# Patient Record
Sex: Male | Born: 1940
Health system: Southern US, Community
[De-identification: ages and names within clinical notes are randomized; demographics above are authoritative.]

## PROBLEM LIST (undated history)

## (undated) ENCOUNTER — Emergency Department (HOSPITAL_COMMUNITY): Payer: Medicare Other | Source: Home / Self Care

## (undated) DIAGNOSIS — I2510 Atherosclerotic heart disease of native coronary artery without angina pectoris: Secondary | ICD-10-CM

## (undated) DIAGNOSIS — I5022 Chronic systolic (congestive) heart failure: Secondary | ICD-10-CM

## (undated) DIAGNOSIS — J449 Chronic obstructive pulmonary disease, unspecified: Secondary | ICD-10-CM

## (undated) DIAGNOSIS — Z952 Presence of prosthetic heart valve: Secondary | ICD-10-CM

## (undated) DIAGNOSIS — I350 Nonrheumatic aortic (valve) stenosis: Secondary | ICD-10-CM

## (undated) DIAGNOSIS — I10 Essential (primary) hypertension: Secondary | ICD-10-CM

---

## 2014-03-25 DIAGNOSIS — M79609 Pain in unspecified limb: Secondary | ICD-10-CM | POA: Diagnosis not present

## 2014-03-25 DIAGNOSIS — I8390 Asymptomatic varicose veins of unspecified lower extremity: Secondary | ICD-10-CM | POA: Diagnosis not present

## 2014-03-25 DIAGNOSIS — I83893 Varicose veins of bilateral lower extremities with other complications: Secondary | ICD-10-CM | POA: Diagnosis not present

## 2014-03-25 DIAGNOSIS — X58XXXA Exposure to other specified factors, initial encounter: Secondary | ICD-10-CM | POA: Diagnosis not present

## 2014-03-25 DIAGNOSIS — R58 Hemorrhage, not elsewhere classified: Secondary | ICD-10-CM | POA: Diagnosis not present

## 2015-10-26 DIAGNOSIS — R0902 Hypoxemia: Secondary | ICD-10-CM | POA: Diagnosis not present

## 2015-10-26 DIAGNOSIS — I350 Nonrheumatic aortic (valve) stenosis: Secondary | ICD-10-CM | POA: Diagnosis present

## 2015-10-26 DIAGNOSIS — D649 Anemia, unspecified: Secondary | ICD-10-CM | POA: Diagnosis present

## 2015-10-26 DIAGNOSIS — R069 Unspecified abnormalities of breathing: Secondary | ICD-10-CM | POA: Diagnosis not present

## 2015-10-26 DIAGNOSIS — I5031 Acute diastolic (congestive) heart failure: Secondary | ICD-10-CM | POA: Diagnosis present

## 2015-10-26 DIAGNOSIS — R0602 Shortness of breath: Secondary | ICD-10-CM | POA: Diagnosis not present

## 2015-10-26 DIAGNOSIS — J441 Chronic obstructive pulmonary disease with (acute) exacerbation: Secondary | ICD-10-CM | POA: Diagnosis not present

## 2015-10-26 DIAGNOSIS — Z23 Encounter for immunization: Secondary | ICD-10-CM | POA: Diagnosis not present

## 2015-10-26 DIAGNOSIS — Z01810 Encounter for preprocedural cardiovascular examination: Secondary | ICD-10-CM | POA: Diagnosis not present

## 2015-10-26 DIAGNOSIS — I509 Heart failure, unspecified: Secondary | ICD-10-CM | POA: Diagnosis not present

## 2015-10-26 DIAGNOSIS — Z87891 Personal history of nicotine dependence: Secondary | ICD-10-CM | POA: Diagnosis not present

## 2015-10-26 DIAGNOSIS — R0600 Dyspnea, unspecified: Secondary | ICD-10-CM | POA: Diagnosis not present

## 2015-10-26 DIAGNOSIS — F419 Anxiety disorder, unspecified: Secondary | ICD-10-CM | POA: Diagnosis not present

## 2015-10-26 DIAGNOSIS — R509 Fever, unspecified: Secondary | ICD-10-CM | POA: Diagnosis not present

## 2015-11-09 DIAGNOSIS — I10 Essential (primary) hypertension: Secondary | ICD-10-CM | POA: Diagnosis not present

## 2015-12-09 DIAGNOSIS — L309 Dermatitis, unspecified: Secondary | ICD-10-CM | POA: Diagnosis not present

## 2015-12-09 DIAGNOSIS — I10 Essential (primary) hypertension: Secondary | ICD-10-CM | POA: Diagnosis not present

## 2016-03-07 DIAGNOSIS — E7800 Pure hypercholesterolemia, unspecified: Secondary | ICD-10-CM | POA: Diagnosis not present

## 2016-03-07 DIAGNOSIS — E785 Hyperlipidemia, unspecified: Secondary | ICD-10-CM | POA: Diagnosis not present

## 2016-03-07 DIAGNOSIS — L309 Dermatitis, unspecified: Secondary | ICD-10-CM | POA: Diagnosis not present

## 2016-03-07 DIAGNOSIS — I10 Essential (primary) hypertension: Secondary | ICD-10-CM | POA: Diagnosis not present

## 2016-06-11 DIAGNOSIS — E7800 Pure hypercholesterolemia, unspecified: Secondary | ICD-10-CM | POA: Diagnosis not present

## 2016-06-11 DIAGNOSIS — Z6826 Body mass index (BMI) 26.0-26.9, adult: Secondary | ICD-10-CM | POA: Diagnosis not present

## 2016-06-11 DIAGNOSIS — L309 Dermatitis, unspecified: Secondary | ICD-10-CM | POA: Diagnosis not present

## 2016-06-11 DIAGNOSIS — I10 Essential (primary) hypertension: Secondary | ICD-10-CM | POA: Diagnosis not present

## 2016-09-12 DIAGNOSIS — E785 Hyperlipidemia, unspecified: Secondary | ICD-10-CM | POA: Diagnosis not present

## 2016-09-12 DIAGNOSIS — I10 Essential (primary) hypertension: Secondary | ICD-10-CM | POA: Diagnosis not present

## 2016-09-12 DIAGNOSIS — R262 Difficulty in walking, not elsewhere classified: Secondary | ICD-10-CM | POA: Diagnosis not present

## 2016-09-12 DIAGNOSIS — Z1389 Encounter for screening for other disorder: Secondary | ICD-10-CM | POA: Diagnosis not present

## 2016-09-12 DIAGNOSIS — H6120 Impacted cerumen, unspecified ear: Secondary | ICD-10-CM | POA: Diagnosis not present

## 2016-10-12 DIAGNOSIS — I10 Essential (primary) hypertension: Secondary | ICD-10-CM | POA: Diagnosis not present

## 2016-10-12 DIAGNOSIS — L237 Allergic contact dermatitis due to plants, except food: Secondary | ICD-10-CM | POA: Diagnosis not present

## 2016-12-14 DIAGNOSIS — L309 Dermatitis, unspecified: Secondary | ICD-10-CM | POA: Diagnosis not present

## 2016-12-14 DIAGNOSIS — M25569 Pain in unspecified knee: Secondary | ICD-10-CM | POA: Diagnosis not present

## 2016-12-14 DIAGNOSIS — I10 Essential (primary) hypertension: Secondary | ICD-10-CM | POA: Diagnosis not present

## 2016-12-14 DIAGNOSIS — E785 Hyperlipidemia, unspecified: Secondary | ICD-10-CM | POA: Diagnosis not present

## 2017-02-08 DIAGNOSIS — I700 Atherosclerosis of aorta: Secondary | ICD-10-CM | POA: Diagnosis not present

## 2017-02-08 DIAGNOSIS — J90 Pleural effusion, not elsewhere classified: Secondary | ICD-10-CM | POA: Diagnosis not present

## 2017-02-08 DIAGNOSIS — R600 Localized edema: Secondary | ICD-10-CM | POA: Diagnosis not present

## 2017-02-08 DIAGNOSIS — R062 Wheezing: Secondary | ICD-10-CM | POA: Diagnosis not present

## 2017-02-08 DIAGNOSIS — R0602 Shortness of breath: Secondary | ICD-10-CM | POA: Diagnosis not present

## 2017-02-12 DIAGNOSIS — J90 Pleural effusion, not elsewhere classified: Secondary | ICD-10-CM | POA: Diagnosis not present

## 2017-02-12 DIAGNOSIS — I700 Atherosclerosis of aorta: Secondary | ICD-10-CM | POA: Diagnosis not present

## 2017-02-12 DIAGNOSIS — I2510 Atherosclerotic heart disease of native coronary artery without angina pectoris: Secondary | ICD-10-CM | POA: Diagnosis not present

## 2017-02-12 DIAGNOSIS — I517 Cardiomegaly: Secondary | ICD-10-CM | POA: Diagnosis not present

## 2017-02-14 DIAGNOSIS — R05 Cough: Secondary | ICD-10-CM | POA: Diagnosis not present

## 2017-02-14 DIAGNOSIS — I509 Heart failure, unspecified: Secondary | ICD-10-CM | POA: Diagnosis not present

## 2017-02-14 DIAGNOSIS — R600 Localized edema: Secondary | ICD-10-CM | POA: Diagnosis not present

## 2017-02-14 DIAGNOSIS — I110 Hypertensive heart disease with heart failure: Secondary | ICD-10-CM | POA: Diagnosis not present

## 2017-03-13 DIAGNOSIS — I10 Essential (primary) hypertension: Secondary | ICD-10-CM | POA: Diagnosis not present

## 2017-03-13 DIAGNOSIS — M25569 Pain in unspecified knee: Secondary | ICD-10-CM | POA: Diagnosis not present

## 2017-03-13 DIAGNOSIS — E785 Hyperlipidemia, unspecified: Secondary | ICD-10-CM | POA: Diagnosis not present

## 2017-03-13 DIAGNOSIS — J90 Pleural effusion, not elsewhere classified: Secondary | ICD-10-CM | POA: Diagnosis not present

## 2017-03-27 DIAGNOSIS — J90 Pleural effusion, not elsewhere classified: Secondary | ICD-10-CM | POA: Diagnosis not present

## 2017-03-27 DIAGNOSIS — I10 Essential (primary) hypertension: Secondary | ICD-10-CM | POA: Diagnosis not present

## 2017-04-03 DIAGNOSIS — E785 Hyperlipidemia, unspecified: Secondary | ICD-10-CM | POA: Diagnosis not present

## 2017-04-03 DIAGNOSIS — R600 Localized edema: Secondary | ICD-10-CM | POA: Diagnosis not present

## 2017-04-03 DIAGNOSIS — E875 Hyperkalemia: Secondary | ICD-10-CM | POA: Diagnosis not present

## 2017-04-15 DIAGNOSIS — E875 Hyperkalemia: Secondary | ICD-10-CM | POA: Diagnosis not present

## 2017-04-24 DIAGNOSIS — L309 Dermatitis, unspecified: Secondary | ICD-10-CM | POA: Diagnosis not present

## 2017-04-24 DIAGNOSIS — I509 Heart failure, unspecified: Secondary | ICD-10-CM | POA: Diagnosis not present

## 2017-04-24 DIAGNOSIS — I10 Essential (primary) hypertension: Secondary | ICD-10-CM | POA: Diagnosis not present

## 2017-04-30 DIAGNOSIS — L03116 Cellulitis of left lower limb: Secondary | ICD-10-CM | POA: Diagnosis not present

## 2017-04-30 DIAGNOSIS — R600 Localized edema: Secondary | ICD-10-CM | POA: Diagnosis not present

## 2017-04-30 DIAGNOSIS — I10 Essential (primary) hypertension: Secondary | ICD-10-CM | POA: Diagnosis not present

## 2017-05-02 DIAGNOSIS — I10 Essential (primary) hypertension: Secondary | ICD-10-CM | POA: Diagnosis not present

## 2017-06-03 DIAGNOSIS — I509 Heart failure, unspecified: Secondary | ICD-10-CM | POA: Diagnosis not present

## 2017-06-03 DIAGNOSIS — R600 Localized edema: Secondary | ICD-10-CM | POA: Diagnosis not present

## 2017-06-03 DIAGNOSIS — I10 Essential (primary) hypertension: Secondary | ICD-10-CM | POA: Diagnosis not present

## 2017-06-04 DIAGNOSIS — I10 Essential (primary) hypertension: Secondary | ICD-10-CM | POA: Diagnosis not present

## 2017-06-04 DIAGNOSIS — I509 Heart failure, unspecified: Secondary | ICD-10-CM | POA: Diagnosis not present

## 2017-06-04 DIAGNOSIS — R600 Localized edema: Secondary | ICD-10-CM | POA: Diagnosis not present

## 2017-07-06 DIAGNOSIS — J918 Pleural effusion in other conditions classified elsewhere: Secondary | ICD-10-CM | POA: Diagnosis not present

## 2017-07-06 DIAGNOSIS — I5031 Acute diastolic (congestive) heart failure: Secondary | ICD-10-CM | POA: Diagnosis not present

## 2017-07-06 DIAGNOSIS — R0902 Hypoxemia: Secondary | ICD-10-CM | POA: Diagnosis not present

## 2017-07-06 DIAGNOSIS — R600 Localized edema: Secondary | ICD-10-CM | POA: Diagnosis not present

## 2017-07-06 DIAGNOSIS — R0602 Shortness of breath: Secondary | ICD-10-CM | POA: Diagnosis not present

## 2017-07-06 DIAGNOSIS — Z79899 Other long term (current) drug therapy: Secondary | ICD-10-CM | POA: Diagnosis not present

## 2017-07-06 DIAGNOSIS — I350 Nonrheumatic aortic (valve) stenosis: Secondary | ICD-10-CM | POA: Diagnosis not present

## 2017-07-06 DIAGNOSIS — I110 Hypertensive heart disease with heart failure: Secondary | ICD-10-CM | POA: Diagnosis not present

## 2017-07-06 DIAGNOSIS — N189 Chronic kidney disease, unspecified: Secondary | ICD-10-CM | POA: Diagnosis not present

## 2017-07-06 DIAGNOSIS — J9601 Acute respiratory failure with hypoxia: Secondary | ICD-10-CM | POA: Diagnosis present

## 2017-07-06 DIAGNOSIS — Z87891 Personal history of nicotine dependence: Secondary | ICD-10-CM | POA: Diagnosis not present

## 2017-07-06 DIAGNOSIS — J449 Chronic obstructive pulmonary disease, unspecified: Secondary | ICD-10-CM | POA: Diagnosis not present

## 2017-07-06 DIAGNOSIS — I509 Heart failure, unspecified: Secondary | ICD-10-CM | POA: Diagnosis not present

## 2017-07-06 DIAGNOSIS — Z7982 Long term (current) use of aspirin: Secondary | ICD-10-CM | POA: Diagnosis not present

## 2017-07-06 DIAGNOSIS — I130 Hypertensive heart and chronic kidney disease with heart failure and stage 1 through stage 4 chronic kidney disease, or unspecified chronic kidney disease: Secondary | ICD-10-CM | POA: Diagnosis present

## 2017-07-06 DIAGNOSIS — M7989 Other specified soft tissue disorders: Secondary | ICD-10-CM | POA: Diagnosis not present

## 2017-07-06 DIAGNOSIS — J90 Pleural effusion, not elsewhere classified: Secondary | ICD-10-CM | POA: Diagnosis not present

## 2017-07-07 DIAGNOSIS — R600 Localized edema: Secondary | ICD-10-CM

## 2017-07-07 DIAGNOSIS — I350 Nonrheumatic aortic (valve) stenosis: Secondary | ICD-10-CM

## 2017-07-07 DIAGNOSIS — R0602 Shortness of breath: Secondary | ICD-10-CM

## 2017-07-07 DIAGNOSIS — I509 Heart failure, unspecified: Secondary | ICD-10-CM

## 2017-07-09 ENCOUNTER — Inpatient Hospital Stay (HOSPITAL_COMMUNITY)
Admission: AD | Admit: 2017-07-09 | Discharge: 2017-07-26 | DRG: 266 | Disposition: A | Discharge status: Skilled Nursing Facility | Payer: Medicare Other | Source: Other Acute Inpatient Hospital | Attending: Thoracic Surgery (Cardiothoracic Vascular Surgery) | Admitting: Thoracic Surgery (Cardiothoracic Vascular Surgery)

## 2017-07-09 ENCOUNTER — Encounter (HOSPITAL_COMMUNITY): Payer: Self-pay | Admitting: Family Medicine

## 2017-07-09 ENCOUNTER — Inpatient Hospital Stay (HOSPITAL_COMMUNITY): Payer: Medicare Other

## 2017-07-09 DIAGNOSIS — K083 Retained dental root: Secondary | ICD-10-CM | POA: Diagnosis present

## 2017-07-09 DIAGNOSIS — I10 Essential (primary) hypertension: Secondary | ICD-10-CM | POA: Diagnosis present

## 2017-07-09 DIAGNOSIS — J449 Chronic obstructive pulmonary disease, unspecified: Secondary | ICD-10-CM | POA: Diagnosis present

## 2017-07-09 DIAGNOSIS — I110 Hypertensive heart disease with heart failure: Secondary | ICD-10-CM | POA: Diagnosis not present

## 2017-07-09 DIAGNOSIS — I361 Nonrheumatic tricuspid (valve) insufficiency: Secondary | ICD-10-CM | POA: Diagnosis not present

## 2017-07-09 DIAGNOSIS — K045 Chronic apical periodontitis: Secondary | ICD-10-CM | POA: Diagnosis present

## 2017-07-09 DIAGNOSIS — Z87891 Personal history of nicotine dependence: Secondary | ICD-10-CM

## 2017-07-09 DIAGNOSIS — I371 Nonrheumatic pulmonary valve insufficiency: Secondary | ICD-10-CM | POA: Diagnosis not present

## 2017-07-09 DIAGNOSIS — R2681 Unsteadiness on feet: Secondary | ICD-10-CM | POA: Diagnosis not present

## 2017-07-09 DIAGNOSIS — Z006 Encounter for examination for normal comparison and control in clinical research program: Secondary | ICD-10-CM

## 2017-07-09 DIAGNOSIS — J948 Other specified pleural conditions: Secondary | ICD-10-CM | POA: Diagnosis not present

## 2017-07-09 DIAGNOSIS — M6281 Muscle weakness (generalized): Secondary | ICD-10-CM | POA: Diagnosis not present

## 2017-07-09 DIAGNOSIS — I5023 Acute on chronic systolic (congestive) heart failure: Secondary | ICD-10-CM | POA: Diagnosis not present

## 2017-07-09 DIAGNOSIS — I700 Atherosclerosis of aorta: Secondary | ICD-10-CM | POA: Diagnosis present

## 2017-07-09 DIAGNOSIS — Z952 Presence of prosthetic heart valve: Secondary | ICD-10-CM | POA: Diagnosis not present

## 2017-07-09 DIAGNOSIS — I5040 Unspecified combined systolic (congestive) and diastolic (congestive) heart failure: Secondary | ICD-10-CM | POA: Diagnosis not present

## 2017-07-09 DIAGNOSIS — K029 Dental caries, unspecified: Secondary | ICD-10-CM | POA: Diagnosis present

## 2017-07-09 DIAGNOSIS — J90 Pleural effusion, not elsewhere classified: Secondary | ICD-10-CM | POA: Diagnosis not present

## 2017-07-09 DIAGNOSIS — I272 Pulmonary hypertension, unspecified: Secondary | ICD-10-CM | POA: Diagnosis present

## 2017-07-09 DIAGNOSIS — F0390 Unspecified dementia without behavioral disturbance: Secondary | ICD-10-CM | POA: Diagnosis present

## 2017-07-09 DIAGNOSIS — K0889 Other specified disorders of teeth and supporting structures: Secondary | ICD-10-CM | POA: Diagnosis present

## 2017-07-09 DIAGNOSIS — M264 Malocclusion, unspecified: Secondary | ICD-10-CM | POA: Diagnosis present

## 2017-07-09 DIAGNOSIS — Z01818 Encounter for other preprocedural examination: Secondary | ICD-10-CM | POA: Diagnosis not present

## 2017-07-09 DIAGNOSIS — I350 Nonrheumatic aortic (valve) stenosis: Secondary | ICD-10-CM

## 2017-07-09 DIAGNOSIS — R54 Age-related physical debility: Secondary | ICD-10-CM | POA: Diagnosis present

## 2017-07-09 DIAGNOSIS — I472 Ventricular tachycardia: Secondary | ICD-10-CM | POA: Diagnosis present

## 2017-07-09 DIAGNOSIS — K06020 Generalized gingival recession, unspecified: Secondary | ICD-10-CM | POA: Diagnosis present

## 2017-07-09 DIAGNOSIS — R41841 Cognitive communication deficit: Secondary | ICD-10-CM | POA: Diagnosis not present

## 2017-07-09 DIAGNOSIS — Z66 Do not resuscitate: Secondary | ICD-10-CM | POA: Diagnosis present

## 2017-07-09 DIAGNOSIS — I25110 Atherosclerotic heart disease of native coronary artery with unstable angina pectoris: Secondary | ICD-10-CM | POA: Diagnosis not present

## 2017-07-09 DIAGNOSIS — R0602 Shortness of breath: Secondary | ICD-10-CM

## 2017-07-09 DIAGNOSIS — D62 Acute posthemorrhagic anemia: Secondary | ICD-10-CM | POA: Diagnosis not present

## 2017-07-09 DIAGNOSIS — N183 Chronic kidney disease, stage 3 unspecified: Secondary | ICD-10-CM | POA: Diagnosis present

## 2017-07-09 DIAGNOSIS — I509 Heart failure, unspecified: Secondary | ICD-10-CM

## 2017-07-09 DIAGNOSIS — Z79899 Other long term (current) drug therapy: Secondary | ICD-10-CM

## 2017-07-09 DIAGNOSIS — M7989 Other specified soft tissue disorders: Secondary | ICD-10-CM | POA: Diagnosis present

## 2017-07-09 DIAGNOSIS — I083 Combined rheumatic disorders of mitral, aortic and tricuspid valves: Secondary | ICD-10-CM | POA: Diagnosis present

## 2017-07-09 DIAGNOSIS — I440 Atrioventricular block, first degree: Secondary | ICD-10-CM | POA: Diagnosis not present

## 2017-07-09 DIAGNOSIS — I130 Hypertensive heart and chronic kidney disease with heart failure and stage 1 through stage 4 chronic kidney disease, or unspecified chronic kidney disease: Secondary | ICD-10-CM | POA: Diagnosis present

## 2017-07-09 DIAGNOSIS — N179 Acute kidney failure, unspecified: Secondary | ICD-10-CM | POA: Diagnosis present

## 2017-07-09 DIAGNOSIS — I5022 Chronic systolic (congestive) heart failure: Secondary | ICD-10-CM | POA: Diagnosis present

## 2017-07-09 DIAGNOSIS — K0530 Chronic periodontitis, unspecified: Secondary | ICD-10-CM | POA: Diagnosis present

## 2017-07-09 DIAGNOSIS — R278 Other lack of coordination: Secondary | ICD-10-CM | POA: Diagnosis not present

## 2017-07-09 DIAGNOSIS — Z741 Need for assistance with personal care: Secondary | ICD-10-CM | POA: Diagnosis not present

## 2017-07-09 DIAGNOSIS — Z01810 Encounter for preprocedural cardiovascular examination: Secondary | ICD-10-CM | POA: Diagnosis not present

## 2017-07-09 DIAGNOSIS — Z954 Presence of other heart-valve replacement: Secondary | ICD-10-CM | POA: Diagnosis not present

## 2017-07-09 DIAGNOSIS — I2510 Atherosclerotic heart disease of native coronary artery without angina pectoris: Secondary | ICD-10-CM | POA: Diagnosis not present

## 2017-07-09 DIAGNOSIS — Z8249 Family history of ischemic heart disease and other diseases of the circulatory system: Secondary | ICD-10-CM

## 2017-07-09 DIAGNOSIS — K5792 Diverticulitis of intestine, part unspecified, without perforation or abscess without bleeding: Secondary | ICD-10-CM | POA: Diagnosis not present

## 2017-07-09 DIAGNOSIS — J811 Chronic pulmonary edema: Secondary | ICD-10-CM | POA: Diagnosis not present

## 2017-07-09 DIAGNOSIS — R0789 Other chest pain: Secondary | ICD-10-CM | POA: Diagnosis not present

## 2017-07-09 HISTORY — DX: Presence of prosthetic heart valve: Z95.2

## 2017-07-09 HISTORY — DX: Nonrheumatic aortic (valve) stenosis: I35.0

## 2017-07-09 HISTORY — DX: Chronic obstructive pulmonary disease, unspecified: J44.9

## 2017-07-09 HISTORY — DX: Atherosclerotic heart disease of native coronary artery without angina pectoris: I25.10

## 2017-07-09 HISTORY — DX: Chronic systolic (congestive) heart failure: I50.22

## 2017-07-09 HISTORY — DX: Essential (primary) hypertension: I10

## 2017-07-09 LAB — BASIC METABOLIC PANEL
ANION GAP: 6 (ref 5–15)
BUN: 32 mg/dL — ABNORMAL HIGH (ref 6–20)
CALCIUM: 8.7 mg/dL — AB (ref 8.9–10.3)
CHLORIDE: 96 mmol/L — AB (ref 101–111)
CO2: 34 mmol/L — AB (ref 22–32)
CREATININE: 1.31 mg/dL — AB (ref 0.61–1.24)
GFR calc non Af Amer: 51 mL/min — ABNORMAL LOW (ref >60)
GFR, EST AFRICAN AMERICAN: 59 mL/min — AB (ref >60)
Glucose, Bld: 150 mg/dL — ABNORMAL HIGH (ref 65–99)
Potassium: 4.1 mmol/L (ref 3.5–5.1)
SODIUM: 136 mmol/L (ref 135–145)

## 2017-07-09 LAB — ECHOCARDIOGRAM COMPLETE
AOVTI: 86.2 cm
AV Area VTI index: 0.23 cm2/m2
AV Area VTI: 0.35 cm2
AV Area mean vel: 0.39 cm2
AV Mean grad: 36 mmHg
AV Peak grad: 66 mmHg
AV VEL mean LVOT/AV: 0.17
AV area mean vel ind: 0.22 cm2/m2
AV peak Index: 0.2
AV pk vel: 406 cm/s
AVPHT: 171 ms
Ao pk vel: 0.16 m/s
CHL CUP AV VALUE AREA INDEX: 0.23
CHL CUP AV VEL: 0.41
CHL CUP MV DEC (S): 106
CHL CUP TV REG PEAK VELOCITY: 400 cm/s
DOP CAL AO MEAN VELOCITY: 274 cm/s
E decel time: 106 msec
FS: 11 % — AB (ref 28–44)
HEIGHTINCHES: 66 in
IVS/LV PW RATIO, ED: 0.92
LA ID, A-P, ES: 42 mm
LA vol A4C: 77.9 ml
LA vol index: 46.1 mL/m2
LA vol: 82.6 mL
LADIAMINDEX: 2.34 cm/m2
LDCA: 2.27 cm2
LEFT ATRIUM END SYS DIAM: 42 mm
LV dias vol index: 103 mL/m2
LV dias vol: 184 mL — AB (ref 62–150)
LV sys vol index: 67 mL/m2
LVOT SV: 35 mL
LVOT VTI: 15.5 cm
LVOT diameter: 17 mm
LVOT peak grad rest: 2 mmHg
LVOTPV: 63.4 cm/s
LVOTVTI: 0.18 cm
LVSYSVOL: 121 mL — AB
MV Peak grad: 5 mmHg
MV pk E vel: 111 m/s
MVAP: 2.2 cm2
MVPKAVEL: 42.2 m/s
MVSPHT: 31 ms
PW: 12 mm — AB (ref 0.6–1.1)
RV LATERAL S' VELOCITY: 9.73 cm/s
RV sys press: 79 mmHg
Simpson's disk: 34
Stroke v: 63 ml
TAPSE: 13.5 mm
TRMAXVEL: 400 cm/s
Valve area: 0.41 cm2
WEIGHTICAEL: 2414.4 [oz_av]

## 2017-07-09 LAB — CBC
HCT: 40.1 % (ref 39.0–52.0)
Hemoglobin: 12.7 g/dL — ABNORMAL LOW (ref 13.0–17.0)
MCH: 28.7 pg (ref 26.0–34.0)
MCHC: 31.7 g/dL (ref 30.0–36.0)
MCV: 90.7 fL (ref 78.0–100.0)
PLATELETS: 234 10*3/uL (ref 150–400)
RBC: 4.42 MIL/uL (ref 4.22–5.81)
RDW: 14.9 % (ref 11.5–15.5)
WBC: 11.4 10*3/uL — ABNORMAL HIGH (ref 4.0–10.5)

## 2017-07-09 MED ORDER — LISINOPRIL 10 MG PO TABS
10.0000 mg | ORAL_TABLET | Freq: Every day | ORAL | Status: DC
  Administered 2017-07-10: 10 mg via ORAL
  Filled 2017-07-09 (×2): qty 1

## 2017-07-09 MED ORDER — ONDANSETRON HCL 4 MG/2ML IJ SOLN: 4.0000 mg | Freq: Four times a day (QID) | INTRAMUSCULAR | Status: DC | PRN

## 2017-07-09 MED ORDER — ACETAMINOPHEN 325 MG PO TABS: 650.0000 mg | ORAL_TABLET | Freq: Four times a day (QID) | ORAL | Status: DC | PRN

## 2017-07-09 MED ORDER — ASPIRIN EC 81 MG PO TBEC
81.0000 mg | DELAYED_RELEASE_TABLET | Freq: Every day | ORAL | Status: DC
  Administered 2017-07-09 – 2017-07-26 (×16): 81 mg via ORAL
  Filled 2017-07-09 (×16): qty 1

## 2017-07-09 MED ORDER — FUROSEMIDE 10 MG/ML IJ SOLN
40.0000 mg | Freq: Two times a day (BID) | INTRAMUSCULAR | Status: DC
  Administered 2017-07-10 – 2017-07-11 (×3): 40 mg via INTRAVENOUS
  Filled 2017-07-09 (×3): qty 4

## 2017-07-09 MED ORDER — ENOXAPARIN SODIUM 40 MG/0.4ML ~~LOC~~ SOLN
40.0000 mg | SUBCUTANEOUS | Status: DC
  Administered 2017-07-09 – 2017-07-14 (×5): 40 mg via SUBCUTANEOUS
  Filled 2017-07-09 (×4): qty 0.4

## 2017-07-09 MED ORDER — POTASSIUM CHLORIDE CRYS ER 20 MEQ PO TBCR
20.0000 meq | EXTENDED_RELEASE_TABLET | Freq: Two times a day (BID) | ORAL | Status: DC
  Administered 2017-07-09 – 2017-07-16 (×14): 20 meq via ORAL
  Filled 2017-07-09 (×14): qty 1

## 2017-07-09 MED ORDER — ONDANSETRON HCL 4 MG PO TABS: 4.0000 mg | ORAL_TABLET | Freq: Four times a day (QID) | ORAL | Status: DC | PRN

## 2017-07-09 MED ORDER — FUROSEMIDE 10 MG/ML IJ SOLN
40.0000 mg | Freq: Every day | INTRAMUSCULAR | Status: DC
  Administered 2017-07-09: 40 mg via INTRAVENOUS
  Filled 2017-07-09: qty 4

## 2017-07-09 MED ORDER — ACETAMINOPHEN 650 MG RE SUPP: 650.0000 mg | Freq: Four times a day (QID) | RECTAL | Status: DC | PRN

## 2017-07-09 MED ORDER — METOPROLOL TARTRATE 25 MG PO TABS
25.0000 mg | ORAL_TABLET | Freq: Every day | ORAL | Status: DC
  Administered 2017-07-09 – 2017-07-11 (×3): 25 mg via ORAL
  Filled 2017-07-09 (×3): qty 1

## 2017-07-09 NOTE — H&P (Signed)
History and Physical  Patient Name: Edward Strickland     WUJ:[id number]    DOB: 03-26-1941    DOA: 07/09/2017 PCP: Jaclynn Major, NP  Patient coming from: [facility]  Chief Complaint: Dyspnea, leg swelling      HPI: Edward Strickland is a 76 y.o. male with a past medical history significant for CHF, HTN who presents with dyspnea.  Patient appears to have some mild dementia and is quite tangential and hard to keep on topic.  No corroborative history is available.  He seems to say he has had worsening leg swelling and dyspnea and exertional intolerance for some weeks or months.  He calls this "dropsy" like his uncle.  Lately it got so bad he went to [facility] and was admitted Sunday.  There he was initially hypoxic to 86% percent on room air, had a chest x-ray with bilateral effusions, mild troponin elevation at 0.07, and a proBNP was elevated.   He was started on Lasix, his troponin trend was flat, and he improved with diuresis. Today he hadn't echocardiogram showed EF 20-25%, and AV peak gradient of 89 and AV area of 0.41. Cardiology were consulted, who recommended transfer to a tertiary care center for cardiology and cardiothoracic surgery evaluation.          ROS: Review of Systems  Respiratory: Positive for shortness of breath.   Cardiovascular: Positive for leg swelling.  All other systems reviewed and are negative.         Past Medical History:  Diagnosis Date  . CHF (congestive heart failure) ([place])   . COPD (chronic obstructive pulmonary disease) ([place])   . Hypertension     History reviewed. No pertinent surgical history.  Social History: Patient lives alone.  The patient walks with a cane.  Remote former smoker.  From [place].  Worked in [employer].  Currently lives independently.  Has no children or spouse.   Cannot name a next of kin or POA.  Only listed contact in his transfer paperwork is a "Ms [name]" of Coralyn Mark, whose phone # is listed in his  discharge bundle.  No Known Allergies  Family history: family history includes Congestive Heart Failure in his paternal uncle.  Prior to Admission medications   Medication Sig Start Date End Date Taking? Authorizing Provider  aspirin EC 81 MG tablet Take 81 mg by mouth daily.   Yes [provider]  furosemide (LASIX) 40 MG tablet Take 40 mg by mouth daily. 07/04/17  Yes [provider]  lisinopril (PRINIVIL,ZESTRIL) 10 MG tablet Take 10 mg by mouth daily. 06/06/17  Yes [provider]  metoprolol tartrate (LOPRESSOR) 25 MG tablet Take 25 mg by mouth daily. 06/19/17  Yes [provider]       Physical Exam: BP 121/69 (BP Location: Left Arm)   Pulse 82   Temp 97.8 F (36.6 C) (Oral)   Resp 20   Ht 5\' 6"  (1.676 m)   Wt 68.4 kg (150 lb 14.4 oz)   SpO2 95%   BMI 24.36 kg/m  General appearance: Thin elderly adult male, alert and in no acute distress.    Eyes: Anicteric, conjunctiva pink, lids and lashes normal. PERRL.    ENT: No nasal deformity, discharge, epistaxis.  Hearing normal. OP moist without lesions.   Neck: No neck masses.  Trachea midline.  No thyromegaly/tenderness. Lymph: No cervical or supraclavicular lymphadenopathy. Skin: Warm and dry.  No jaundice.  No suspicious rashes or lesions. Cardiac: RRR, nl S1-S2, SEM present.  Capillary refill is brisk.  JVP not particularly elevated.  Trace LE edema.  Radial pulses 2+ and symmetric. Respiratory: Normal respiratory rate and rhythm.  Dimiished at bases.  Some bibasilar crackles.  No wheezing.  Sounds dyspneic when he talkes. Abdomen: Abdomen soft.  No TTP. No ascites, distension, hepatosplenomegaly.   MSK: No deformities or effusions.  No cyanosis or clubbing. Neuro: Cranial nerves 3-12 intact.  Sensation intact to light touch. Speech is fluent.  Muscle strength 5/5 and symmetric.    Psych: Sensorium intact and responding to questions, attention diminished, tangential.   Oriented to ""  and "Gloris [place]" but misses year and month. Behavior appropriate.  Affect normal.  Judgment and insight appear slightly impaired.     Labs on Admission:  I have personally reviewed following labs and imaging studies from his outpatient facility: Basic metabolic panel shows normal sodium, potassium, elevated creatinine, unknown baseline. Complete blood count shows no leukocytosis, anemia, thrombocytopenia. Elevated proBNP. Minimally elevated troponin, flat trajectory. Chest x-ray shows bilateral pleural effusions, report reviewed, no edema.  Echocardiogram shows EF 20-25%, mild LVH, elevated AV gradient and diminished AV area. Severe tricuspid regurgitation. PA P 52. Bilateral Dopplers of the legs show no DVT.   EKG: Independently reviewed. ECG showed no ischemic changes.         Assessment/Plan  1. Acute on chronic systolic CHF with severe aortic stenosis:    -Furosemide 40 mg IV daily -K supplement -Strict I/Os, daily weights, telemetry  -Daily monitoring renal function -Continue ACEi, BB -Consult to Cardiology, appreciate cares   2. Hypertension:  -Continue BB, ACEi, aspirin  3. Chronic kidney disease:  Elevated Cr presumably, baseline unknown.  Probably CKD. -Daily SCr  4. COPD:  Chart history.  Patient not on inhalers.  Remote smoking history.  Unclear if this is true.      DVT prophylaxis: Lovenox  Code Status: DO NOT RESUSCITATE  Family Communication: None present  Disposition Plan: Anticipate Cardiology consultation, likely will need CT surgery consultation. Consults called: Cardiology via Inbasket Admission status: INPATIENT        Medical decision making: Patient seen at 1:07 AM on 07/09/2017.  What exists of the patient's chart was reviewed in depth and summarized above.  Clinical condition: stable.        Edwin Dada [facility] Pager [phone]      At the time of admission, it appears that the appropriate admission  status for this patient is INPATIENT. This is judged to be reasonable and necessary in order to provide the required intensity of service to ensure the patient's safety given the presenting symptoms, physical exam findings, and initial radiographic and laboratory data in the context of their chronic comorbidities.  Together, these circumstances are felt to place him at high risk for further clinical deterioration threatening life, limb, or organ.   Patient requires inpatient status due to high intensity of service, high risk for further deterioration and high frequency of surveillance required because of this severe exacerbation of their chronic organ failure.  I certify that at the point of admission it is my clinical judgment that the patient will require inpatient hospital care spanning beyond 2 midnights from the point of admission and that early discharge would result in unnecessary risk of decompensation and readmission or threat to life, limb or bodily function.

## 2017-07-09 NOTE — Plan of Care (Signed)
Problem: Safety: Goal: Ability to remain free from injury will improve Outcome: Progressing Up with one assist and cane   Problem: Health Behavior/Discharge Planning: Goal: Ability to manage health-related needs will improve Outcome: Progressing Has a friend "sis" who helps him

## 2017-07-09 NOTE — Progress Notes (Signed)
*  PRELIMINARY RESULTS* Echocardiogram 2D Echocardiogram has been performed.  Edward Strickland 07/09/2017, 4:32 PM

## 2017-07-09 NOTE — Progress Notes (Signed)
Patient stable during 7 a to 7 p shift.  Patient with oxygen saturation 81% on room air, maintains in the 90's with 2 liters however patient is very dyspneic even at rest.  Patient with multiple loose BM's during shift, appears to be patients baseline.  He says it is because of the fruit and salad we have fed him here.

## 2017-07-09 NOTE — Progress Notes (Signed)
Patient seen and examined. Admitted after midnight secondary dyspneal leg swelling. Patient originally admitted at [facility] were he was receiving treatment for CHF exacerbation. While hospitalized he had 2-D echo done demonstrating EF 20-25% and severe aortic stenosis. Cardiology recommended transfer to [facility], Given findings of severe aortic stenosis with acute CHF in order to be evaluated by the multidisciplinary valve team and to perform cardiac cath if needed. Cardiology service is on board and will continue current IV lasix and oxygen supplementation. Follow renal function and electrolytes. Please refer to H&P written by Dr. Loleta Books for further info/details on admission.   Barton Dubois MD [phone]

## 2017-07-09 NOTE — Consult Note (Signed)
Inpatient TAVR Consultation:   Patient ID: Edward Strickland; [id number]; [birth year]   Admit date: 07/09/2017 Date of Consult: 07/09/2017  Primary Care Provider: Jaclynn Major, NP Primary Cardiologist: New    Patient Profile:   Edward Strickland is a 76 y.o. male with a hx of HTN, aortic stenosis, and COPD who is being seen today for the evaluation of severe AS and CHF at the request of Dr. Dyann Kief.  History of Present Illness:   Per review of [facility] notes, the patient was originally diagnosed with severe aortic stenosis back in 2016, but LV function was normal. Review of previous echo report from 09/2015 EF 60-65%, showed trileaflet aortic valve with AVA 0.44 cm^2, mean gradient 66 mm Hg and peak gradient 89. He follows with Jaclynn Major NP in [place]. He does not follow with a cardiologist. He has been maintained on PO lasix 40mg  daily, Lopressor 12.5mg  BID and Lisinopril 10mg  daily. He has poor dentition and does not regularly see a dentist.   He lives alone in a house in [place]. He has a friend named [name] who lives close by and checks in on him. He is divorced and has no kids. He has no other living family. He is now retired but worked for [employer] for over 25 years. He previously smoked but quit remotely. He walks with a cane and drives his car to get his meals at a [employer]. He enjoys panning for gold in his house. He is not very active physically. He likes to walk around his house, but has trouble with this because of dyspnea on exertion and lower extremity edema. He reports that when his LE swelling gets bad, this limits his mobility the most. He also reports orthopnea and PND and having to get up and walk around during the night to get comfortable. He denies chest pain or dizziness/syncope.  Edward Strickland was in his usual state of health until 07/07/17 when he presented to [facility] ED complaining of worsening lower extremity edema and dyspnea on  exertion  a few weeks. In the ED he was hypoxic O2 sat 86% on room air, lower extremity dopplers negative for DVT, chest x-ray with evidence of CHF with bilateral pleural effusions R>L, cardiomegaly and atelectasis vs pneumonia in right middle lobe/lung base. ProBNP 28,800. Troponin 0.07--> 0.08, but there is report that his troponin is chronically mildly elevated. ECG with sinus rhythm and nonspecific ST/TW changes. WBC 9.6, H/H 12.6/38.8, PLT 193, sodium 139, K 4.4, BUN 38, creatinine 1.3, GFR 54, INR 1.2. He was started on IV Lasix and admitted to the hospital. 2D echo was performed which showed mild LVH with severe global hypokinesis, EF 20-25%, moderately enlarged RV with moderate systolic RV dysfunction, severe aortic stenosis with a mean gradient of 51 mmHg and a peak gradient of 89 mmHg, AVA 0.41 cm, mild biatrial enlargement, mild MR, moderate to severe TR, moderate pulmonary hypertension with PA 52 mmHg.  Given findings of severe aortic stenosis with acute CHF he was transferred to [facility] for further workup and evaluation by the multidisciplinary valve team.   He is currently feeling much better. No CP or SOB. He still has some mild LE edema, but no orthopnea or PND. No dizziness or syncope. No blood in stool or urine. No palpitations.    Past Medical History:  Diagnosis Date  . CHF (congestive heart failure) ([place])   . COPD (chronic obstructive pulmonary disease) ([place])   . Hypertension  History reviewed. No pertinent surgical history.   Inpatient Medications: Scheduled Meds: . aspirin EC  81 mg Oral Daily  . enoxaparin (LOVENOX) injection  40 mg Subcutaneous Q24H  . furosemide  40 mg Intravenous Daily  . lisinopril  10 mg Oral Daily  . metoprolol tartrate  25 mg Oral Daily  . potassium chloride  20 mEq Oral BID   Continuous Infusions:  PRN Meds: acetaminophen **OR** acetaminophen, ondansetron **OR** ondansetron (ZOFRAN) IV  Allergies:   No Known  Allergies  Social History:   Social History   Social History  . Marital status: Single    Spouse name: N/A  . Number of children: N/A  . Years of education: N/A   Occupational History  . Not on file.   Social History Main Topics  . Smoking status: Former Research scientist (life sciences)  . Smokeless tobacco: Never Used  . Alcohol use Not on file  . Drug use: Unknown  . Sexual activity: Not on file   Other Topics Concern  . Not on file   Social History Narrative  . No narrative on file    Family History:   The patient's family history includes Congestive Heart Failure in his paternal uncle.  ROS:  Please see the history of present illness.  ROS  All other ROS reviewed and negative.     Physical Exam/Data:   Vitals:   07/09/17 0017 07/09/17 0552 07/09/17 0801 07/09/17 0803  BP: 121/69 130/84    Pulse: 82 88    Resp: 20 18    Temp: 97.8 F (36.6 C) 97.6 F (36.4 C)    TempSrc: Oral Oral    SpO2: 95% 94% (!) 82% 94%  Weight: 150 lb 14.4 oz (68.4 kg)     Height: 5\' 6"  (1.676 m)       Intake/Output Summary (Last 24 hours) at 07/09/17 0912 Last data filed at 07/09/17 0857  Gross per 24 hour  Intake              600 ml  Output                0 ml  Net              600 ml   Filed Weights   07/09/17 0017  Weight: 150 lb 14.4 oz (68.4 kg)   Body mass index is 24.36 kg/m.  General:  Well nourished, well developed, in no acute distress, disheveled appearing, poor dentition HEENT: normal Lymph: no adenopathy Neck: no JVD Endocrine:  No thryomegaly Vascular: No carotid bruits; FA pulses 2+ bilaterally without bruits  Cardiac:  normal S1, S2; RRR; 3/6 SEM at RUSB Lungs: bilateral crackles midway up lung fields Abd: soft, nontender, no hepatomegaly  Ext: 1 + bilateral LE edema with some mild erythema  Musculoskeletal:  No deformities, BUE and BLE strength normal and equal Skin: warm and dry  Neuro:  CNs 2-12 intact, no focal abnormalities noted Psych:  Normal affect   EKG:  The  EKG was personally reviewed and demonstrates: sinus with 1st deg AV block, LVH with repol abnormality, HR 74 Telemetry:  Telemetry was personally reviewed and demonstrates:  NSR with rare PVC and 7 beat run of NSVT   Relevant CV Studies: 2D ECHO (see HPI), repeat study pending here  Laboratory Data:  ChemistryNo results for input(s): NA, K, CL, CO2, GLUCOSE, BUN, CREATININE, CALCIUM, GFRNONAA, GFRAA, ANIONGAP in the last 168 hours.  No results for input(s): PROT, ALBUMIN, AST, ALT, ALKPHOS, BILITOT in  the last 168 hours. Hematology Recent Labs Lab 07/09/17 0504  WBC 11.4*  RBC 4.42  HGB 12.7*  HCT 40.1  MCV 90.7  MCH 28.7  MCHC 31.7  RDW 14.9  PLT 234   Cardiac EnzymesNo results for input(s): TROPONINI in the last 168 hours. No results for input(s): TROPIPOC in the last 168 hours.  BNPNo results for input(s): BNP, PROBNP in the last 168 hours.  DDimer No results for input(s): DDIMER in the last 168 hours.  Radiology/Studies:  No results found.   STS Risk Calculator: Procedure: AV Replacement  Risk of Mortality: 2.468%  Morbidity or Mortality: 22.844%  Long Length of Stay: 8.651%  Short Length of Stay: 29.284%  Permanent Stroke: 1.199%  Prolonged Ventilation: 13.137%  DSW Infection: 0.626%  Renal Failure: 4.921%  Reoperation: 9.559%    Assessment and Plan:   Edward Strickland is a 76 y.o. male with symptoms of severe, stage D aortic stenosis with NYHA Class II-III symptoms. I have personally reviewed the patient's recent echocardiogram which is notable for reduced LV systolic function (EF 64-33%) and severe aortic stenosis with peak gradient of 89 mmHg and mean transvalvular gradient of 51 mmHg. The patient's calculated aortic valve area is 0.41 cm.   I have reviewed the natural history of aortic stenosis with the patient. We have discussed the limitations of medical therapy and the poor prognosis associated with symptomatic aortic stenosis. We have reviewed potential  treatment options, including palliative medical therapy, conventional surgical aortic valve replacement, and transcatheter aortic valve replacement. We discussed treatment options in the context of this patient's specific comorbid medical conditions.   The patient is somewhat frail but lives independently. He walks with the assistance of a cane but still drives his car to get back and forth to his favorite restaurant. The patient is a difficult historian but it seems his mobility is mostly limited by his dyspnea and LE edema. He does walk around his house. He has has severe AS by echo since at least 2016. Now he has biventricular dysfunction and mod-severe TR with clinical CHF requiring inpatient admission.   The patient's predicted risk of mortality with conventional aortic valve replacement is 2.468% primarily based on his LV dysfunction and age. Other significant comorbid conditions include his frailty,  presumed mild COPD, HTN, pulmonary HTN, mod-severe TR and possible dementia. TAVR seems like a reasonable treatment option for this patient pending formal cardiac surgical consultation. We discussed typical evaluation which will require a gated cardiac CTA and a CTA of the chest/abdomen/pelvis to evaluate both his cardiac anatomy and peripheral vasculature as well as L/RHC which may be able to be completed during this admission. I have ordered an echo here as they did not send the images from [place].   Signed, Angelena Form, PA-C  07/09/2017 9:12 AM   Patient seen, examined. Available data reviewed. Agree with findings, assessment, and plan as outlined by Nell Range, PA-C. On my exam tonight: Vitals:   07/09/17 0918 07/09/17 1100  BP: (!) 112/58 (!) 93/52  Pulse: 89 70  Resp:  18  Temp:  (!) 97.3 F (36.3 C)  SpO2: 98% 94%   Pt is alert and oriented, WD, WN, in no distress. HEENT: normal except for poor dentition. Neck: JVP elevated. Carotid upstrokes delayed with bilateral bruits.  No thyromegaly. Lungs: diminished in the bases CV: Apex is discrete and nondisplaced, RRR with distant heart sounds, 3/6 harsh late peaking systolic murmur at the RUSB/LLSB Abd: soft, NT, +BS, no bruit,  no hepatosplenomegaly Back: no CVA tenderness Ext: 1= pretibial edema Skin: warm and dry, chronic stasis changes L>R Neuro: CNII-XII intact             Strength intact = bilaterally  The patient has acute on chronic systolic heart failure in the setting of severe aortic stenosis. He previously had normal LV function with a normal LVEF and severely elevated transaortic valve gradients based on an echo report from 2016 when his mean gradient was 66 mmHg. I have personally reviewed his recent echo images which demonstrate severe global LV systolic dysfunction and very severe aortic stenosis. The aortic valve is severely calcified and markedly restricted. There is at least mild aortic insufficiency. The dimensionless index is 0.16. I reviewed the natural history of aortic stenosis with the patient today. He seems to have fairly limited insight but does understand the basic principle that his prognosis is poor with no intervention. He would like to seek treatment. The patient lives independently with a limited functional capacity. He is able to get outside and he ambulates with a cane.  I discussed the need for right and left heart catheterization as a first step for this patient. I have reviewed the risks, indications, and alternatives to cardiac catheterization, possible angioplasty, and stenting with the patient. Risks include but are not limited to bleeding, infection, vascular injury, stroke, myocardial infection, arrhythmia, kidney injury, radiation-related injury in the case of prolonged fluoroscopy use, emergency cardiac surgery, and death. The patient understands the risks of serious complication is 1-2 in 8916 with diagnostic cardiac cath and 1-2% or less with angioplasty/stenting. Will plan to do  this in the next 24-48 hours depending on his clinical stability. Will add a chest x-ray as on exam he appears to have bilateral pleural effusions. There is also a pleural effusion appreciable on his echocardiogram. Will increase IV furosemide to twice daily dosing. Will follow-up in the morning to make sure his respiratory status is stable for a cardiac catheterization procedure. Following heart catheterization I would anticipate CT angiography studies, PFTs, and cardiac surgical evaluation.  Sherren Mocha, M.D. 07/09/2017 6:58 PM

## 2017-07-10 ENCOUNTER — Inpatient Hospital Stay (HOSPITAL_COMMUNITY): Payer: Medicare Other

## 2017-07-10 ENCOUNTER — Encounter (HOSPITAL_COMMUNITY): Payer: Self-pay | Admitting: Dentistry

## 2017-07-10 ENCOUNTER — Other Ambulatory Visit: Payer: Self-pay | Admitting: *Deleted

## 2017-07-10 ENCOUNTER — Other Ambulatory Visit: Payer: Self-pay

## 2017-07-10 ENCOUNTER — Encounter (HOSPITAL_COMMUNITY)
Admission: AD | Disposition: A | Discharge status: Skilled Nursing Facility | Payer: Self-pay | Source: Other Acute Inpatient Hospital | Attending: Cardiovascular Disease

## 2017-07-10 DIAGNOSIS — I472 Ventricular tachycardia: Secondary | ICD-10-CM

## 2017-07-10 DIAGNOSIS — I350 Nonrheumatic aortic (valve) stenosis: Secondary | ICD-10-CM

## 2017-07-10 DIAGNOSIS — Z01818 Encounter for other preprocedural examination: Secondary | ICD-10-CM

## 2017-07-10 DIAGNOSIS — J90 Pleural effusion, not elsewhere classified: Secondary | ICD-10-CM

## 2017-07-10 DIAGNOSIS — J449 Chronic obstructive pulmonary disease, unspecified: Secondary | ICD-10-CM

## 2017-07-10 LAB — BASIC METABOLIC PANEL
Anion gap: 6 (ref 5–15)
BUN: 28 mg/dL — AB (ref 6–20)
CALCIUM: 8.8 mg/dL — AB (ref 8.9–10.3)
CO2: 35 mmol/L — ABNORMAL HIGH (ref 22–32)
CREATININE: 1.11 mg/dL (ref 0.61–1.24)
Chloride: 96 mmol/L — ABNORMAL LOW (ref 101–111)
GFR calc non Af Amer: >60 mL/min (ref >60)
Glucose, Bld: 119 mg/dL — ABNORMAL HIGH (ref 65–99)
Potassium: 4.3 mmol/L (ref 3.5–5.1)
SODIUM: 137 mmol/L (ref 135–145)

## 2017-07-10 LAB — POCT I-STAT 3, VENOUS BLOOD GAS (G3P V)
ACID-BASE EXCESS: 9 mmol/L — AB (ref 0.0–2.0)
Acid-Base Excess: 10 mmol/L — ABNORMAL HIGH (ref 0.0–2.0)
BICARBONATE: 37.7 mmol/L — AB (ref 20.0–28.0)
BICARBONATE: 39 mmol/L — AB (ref 20.0–28.0)
O2 SAT: 45 %
O2 Saturation: 60 %
PCO2 VEN: 76.1 mmHg — AB (ref 44.0–60.0)
PO2 VEN: 28 mmHg — AB (ref 32.0–45.0)
TCO2: 41 mmol/L — AB (ref 22–32)
TCO2: 40 mmol/L — ABNORMAL HIGH (ref 22–32)
pCO2, Ven: 75.3 mmHg (ref 44.0–60.0)
pH, Ven: 7.308 (ref 7.250–7.430)
pH, Ven: 7.317 (ref 7.250–7.430)
pO2, Ven: 36 mmHg (ref 32.0–45.0)

## 2017-07-10 LAB — POCT I-STAT 3, ART BLOOD GAS (G3+)
ACID-BASE EXCESS: 9 mmol/L — AB (ref 0.0–2.0)
BICARBONATE: 36.9 mmol/L — AB (ref 20.0–28.0)
O2 SAT: 91 %
PO2 ART: 65 mmHg — AB (ref 83.0–108.0)
TCO2: 39 mmol/L — AB (ref 22–32)
pCO2 arterial: 63.6 mmHg — ABNORMAL HIGH (ref 32.0–48.0)
pH, Arterial: 7.371 (ref 7.350–7.450)

## 2017-07-10 LAB — PROTIME-INR
INR: 1.12
PROTHROMBIN TIME: 14.3 s (ref 11.4–15.2)

## 2017-07-10 SURGERY — RIGHT HEART CATH AND CORONARY ANGIOGRAPHY
Anesthesia: LOCAL

## 2017-07-10 MED ORDER — HEPARIN SODIUM (PORCINE) 1000 UNIT/ML IJ SOLN
INTRAMUSCULAR | Status: DC | PRN
  Administered 2017-07-10: 4000 [IU] via INTRAVENOUS

## 2017-07-10 MED ORDER — FUROSEMIDE 10 MG/ML IJ SOLN
INTRAMUSCULAR | Status: AC
  Filled 2017-07-10: qty 8

## 2017-07-10 MED ORDER — SODIUM CHLORIDE 0.9% FLUSH: 3.0000 mL | INTRAVENOUS | Status: DC | PRN

## 2017-07-10 MED ORDER — MIDAZOLAM HCL 2 MG/2ML IJ SOLN
INTRAMUSCULAR | Status: DC | PRN
  Administered 2017-07-10: 1 mg via INTRAVENOUS

## 2017-07-10 MED ORDER — SODIUM CHLORIDE 0.9 % IV SOLN: INTRAVENOUS | Status: DC

## 2017-07-10 MED ORDER — HEPARIN (PORCINE) IN NACL 2-0.9 UNIT/ML-% IJ SOLN
INTRAMUSCULAR | Status: AC
  Filled 2017-07-10: qty 500

## 2017-07-10 MED ORDER — MIDAZOLAM HCL 2 MG/2ML IJ SOLN
INTRAMUSCULAR | Status: AC
  Filled 2017-07-10: qty 2

## 2017-07-10 MED ORDER — LIDOCAINE HCL (PF) 1 % IJ SOLN
INTRAMUSCULAR | Status: AC
  Filled 2017-07-10: qty 30

## 2017-07-10 MED ORDER — VERAPAMIL HCL 2.5 MG/ML IV SOLN
INTRAVENOUS | Status: DC | PRN
  Administered 2017-07-10: 10 mL via INTRA_ARTERIAL

## 2017-07-10 MED ORDER — FENTANYL CITRATE (PF) 100 MCG/2ML IJ SOLN
INTRAMUSCULAR | Status: AC
  Filled 2017-07-10: qty 2

## 2017-07-10 MED ORDER — SODIUM CHLORIDE 0.9% FLUSH: 3.0000 mL | Freq: Two times a day (BID) | INTRAVENOUS | Status: DC

## 2017-07-10 MED ORDER — IOPAMIDOL (ISOVUE-370) INJECTION 76%
INTRAVENOUS | Status: DC | PRN
  Administered 2017-07-10: 35 mL

## 2017-07-10 MED ORDER — VERAPAMIL HCL 2.5 MG/ML IV SOLN
INTRAVENOUS | Status: AC
  Filled 2017-07-10: qty 2

## 2017-07-10 MED ORDER — LIDOCAINE HCL (PF) 1 % IJ SOLN
INTRAMUSCULAR | Status: DC | PRN
  Administered 2017-07-10 (×2): 2 mL

## 2017-07-10 MED ORDER — SODIUM CHLORIDE 0.9 % IV SOLN: 250.0000 mL | INTRAVENOUS | Status: DC | PRN

## 2017-07-10 MED ORDER — FUROSEMIDE 10 MG/ML IJ SOLN
INTRAMUSCULAR | Status: DC | PRN
  Administered 2017-07-10: 80 mg via INTRAVENOUS

## 2017-07-10 MED ORDER — HEPARIN (PORCINE) IN NACL 2-0.9 UNIT/ML-% IJ SOLN
INTRAMUSCULAR | Status: DC | PRN
  Administered 2017-07-10: 13:00:00

## 2017-07-10 MED ORDER — SODIUM CHLORIDE 0.9% FLUSH
3.0000 mL | Freq: Two times a day (BID) | INTRAVENOUS | Status: DC
  Administered 2017-07-10 – 2017-07-25 (×22): 3 mL via INTRAVENOUS

## 2017-07-10 MED ORDER — ASPIRIN 81 MG PO CHEW: 81.0000 mg | CHEWABLE_TABLET | ORAL | Status: DC

## 2017-07-10 MED ORDER — FUROSEMIDE 10 MG/ML IJ SOLN
20.0000 mg | Freq: Once | INTRAMUSCULAR | Status: DC
  Filled 2017-07-10: qty 2

## 2017-07-10 MED ORDER — FENTANYL CITRATE (PF) 100 MCG/2ML IJ SOLN
INTRAMUSCULAR | Status: DC | PRN
  Administered 2017-07-10: 25 ug via INTRAVENOUS

## 2017-07-10 MED ORDER — IOPAMIDOL (ISOVUE-370) INJECTION 76%
INTRAVENOUS | Status: AC
  Filled 2017-07-10: qty 100

## 2017-07-10 SURGICAL SUPPLY — 11 items

## 2017-07-10 NOTE — Interval H&P Note (Signed)
History and Physical Interval Note:  07/10/2017 11:48 AM  Edward Strickland  has presented today for surgery, with the diagnosis of cp  The various methods of treatment have been discussed with the patient and family. After consideration of risks, benefits and other options for treatment, the patient has consented to  Procedure(s): RIGHT/LEFT HEART CATH AND CORONARY ANGIOGRAPHY (N/A) as a surgical intervention .  The patient's history has been reviewed, patient examined, no change in status, stable for surgery.  I have reviewed the patient's chart and labs.  Questions were answered to the patient's satisfaction.     Sherren Mocha

## 2017-07-10 NOTE — Progress Notes (Signed)
PROGRESS NOTE  Edward Strickland  WGN:[id number]  DOB: November 29, 1940  DOA: 07/09/2017 PCP: Jaclynn Major, NP  Brief Admission Hx: Edward Strickland is a 76 y.o. male with a past medical history significant for CHF, HTN who presents with dyspnea, transferred from [facility] for cardiology evaluation.   MDM/Assessment & Plan:   Acute on chronic systolic heart failure in the setting of severe aortic stenosis - appreciate cardiology assistance, planning for left and right heart cath. Echocardiogram: Systolic function was severely reduced. The estimated ejection fraction was in the range of 25% to 30%.  Diffuse hypokinesis.  Severe AS - per cardiology service, planning cath as above.   Hypertension - BP well controlled, continue current medical therapy.   COPD - stable  Right pleural effuison - Consult to IR for thoracentesis requested.   I spoke with Dr. Burt Knack, he will take over care of patient.    DVT prophylaxis: lovenox Code Status: DNR Family Communication: no family present at bedside today Disposition Plan: TBD   Consultants:  cardiology  Procedures:  Cath pending  Subjective: Pt sitting up in chair, no complaints, no chest pain, no SOB.   Objective: Vitals:   07/09/17 0918 07/09/17 1100 07/09/17 2003 07/10/17 0531  BP: (!) 112/58 (!) 93/52 (!) 105/49 (!) 110/54  Pulse: 89 70 74 81  Resp:  18 17 18   Temp:  (!) 97.3 F (36.3 C) 98 F (36.7 C) 97.9 F (36.6 C)  TempSrc:  Oral Oral Oral  SpO2: 98% 94% 98% 96%  Weight:    69.2 kg (152 lb 8 oz)  Height:        Intake/Output Summary (Last 24 hours) at 07/10/17 0748 Last data filed at 07/10/17 0530  Gross per 24 hour  Intake              600 ml  Output             1725 ml  Net            -1125 ml   Filed Weights   07/09/17 0017 07/10/17 0531  Weight: 68.4 kg (150 lb 14.4 oz) 69.2 kg (152 lb 8 oz)     REVIEW OF SYSTEMS  As per history otherwise all reviewed and reported negative  Exam:  General  exam: awake, alert, NAD.   Respiratory system: No increased work of breathing. Cardiovascular system: S1 & S2 heard, loud systolic murmur.  Gastrointestinal system: Abdomen is nondistended, soft and nontender. Normal bowel sounds heard. Central nervous system: Alert and oriented. No focal neurological deficits. Extremities: no cyanosis.   Data Reviewed: Basic Metabolic Panel:  Recent Labs Lab 07/09/17 1935 07/10/17 0445  NA 136 137  K 4.1 4.3  CL 96* 96*  CO2 34* 35*  GLUCOSE 150* 119*  BUN 32* 28*  CREATININE 1.31* 1.11  CALCIUM 8.7* 8.8*   Liver Function Tests: No results for input(s): AST, ALT, ALKPHOS, BILITOT, PROT, ALBUMIN in the last 168 hours. No results for input(s): LIPASE, AMYLASE in the last 168 hours. No results for input(s): AMMONIA in the last 168 hours. CBC:  Recent Labs Lab 07/09/17 0504  WBC 11.4*  HGB 12.7*  HCT 40.1  MCV 90.7  PLT 234   Cardiac Enzymes: No results for input(s): CKTOTAL, CKMB, CKMBINDEX, TROPONINI in the last 168 hours. CBG (last 3)  No results for input(s): GLUCAP in the last 72 hours. No results found for this or any previous visit (from the past 240 hour(s)).   Studies: Dg  Chest Port 1v Same Day  Result Date: 07/09/2017 CLINICAL DATA:  Shortness of breath.  Hypoxia. EXAM: PORTABLE CHEST 1 VIEW COMPARISON:  None. FINDINGS: There is a large right pleural effusion with associated airspace disease. Cardiomegaly and interstitial edema are identified. No pneumothorax. No left effusion. Large right pleural effusion and associated airspace disease which could be atelectasis or pneumonia. Cardiomegaly and interstitial pulmonary edema. IMPRESSION: No active disease. Electronically Signed   By: Inge Rise M.D.   On: 07/09/2017 23:05   Scheduled Meds: . aspirin EC  81 mg Oral Daily  . enoxaparin (LOVENOX) injection  40 mg Subcutaneous Q24H  . furosemide  40 mg Intravenous BID  . lisinopril  10 mg Oral Daily  . metoprolol tartrate   25 mg Oral Daily  . potassium chloride  20 mEq Oral BID   Continuous Infusions:  Principal Problem:   Acute on chronic systolic CHF (congestive heart failure) (HCC) Active Problems:   Aortic stenosis, severe   CKD (chronic kidney disease), stage III   Essential hypertension   COPD (chronic obstructive pulmonary disease) ([place])  Time spent:   Irwin Brakeman, MD, FAAFP [facility] Pager [phone]  If 7PM-7AM, please contact night-coverage [email or web address] Password [email or web address] 07/10/2017, 7:48 AM    LOS: 1 day  '

## 2017-07-10 NOTE — H&P (View-Only) (Signed)
Patient Name: Edward Strickland Date of Encounter: 07/10/2017  Primary Cardiologist: Dr. Tyrell Antonio Problem List     Principal Problem:   Acute on chronic systolic CHF (congestive heart failure) ([place]) Active Problems:   Aortic stenosis, severe   CKD (chronic kidney disease), stage III   Essential hypertension   COPD (chronic obstructive pulmonary disease) (HCC)     Subjective   No complaints. Breathing much better. LE edema improving. He thinks he can lie flat for cath.   Inpatient Medications    Scheduled Meds: . aspirin EC  81 mg Oral Daily  . enoxaparin (LOVENOX) injection  40 mg Subcutaneous Q24H  . furosemide  40 mg Intravenous BID  . lisinopril  10 mg Oral Daily  . metoprolol tartrate  25 mg Oral Daily  . potassium chloride  20 mEq Oral BID   Continuous Infusions:  PRN Meds: acetaminophen **OR** acetaminophen, ondansetron **OR** ondansetron (ZOFRAN) IV   Vital Signs    Vitals:   07/09/17 0918 07/09/17 1100 07/09/17 2003 07/10/17 0531  BP: (!) 112/58 (!) 93/52 (!) 105/49 (!) 110/54  Pulse: 89 70 74 81  Resp:  18 17 18   Temp:  (!) 97.3 F (36.3 C) 98 F (36.7 C) 97.9 F (36.6 C)  TempSrc:  Oral Oral Oral  SpO2: 98% 94% 98% 96%  Weight:    152 lb 8 oz (69.2 kg)  Height:        Intake/Output Summary (Last 24 hours) at 07/10/17 0926 Last data filed at 07/10/17 0530  Gross per 24 hour  Intake                0 ml  Output             1725 ml  Net            -1725 ml   Filed Weights   07/09/17 0017 07/10/17 0531  Weight: 150 lb 14.4 oz (68.4 kg) 152 lb 8 oz (69.2 kg)    Physical Exam   GEN: Well nourished, well developed, in no acute distress.  HEENT: Grossly normal.  Neck: Supple, no JVD, carotid upstrokes delayed with bilateral bruits. no masses. Cardiac: RRR, 3/6 harsh late peaking systolic murmur at RUSB/LLSB. No rubs or gallops. No clubbing, cyanosis. 1 + pretibial edema.  Radials/DP/PT 2+ and equal bilaterally.  Respiratory:  Respirations  regular and unlabored, diminished breath sounds Right > L GI: Soft, nontender, nondistended, BS + x 4. MS: no deformity or atrophy. Skin: warm and dry, no rash. chronic stasis changes L>R Neuro:  Strength and sensation are intact. Psych: AAOx3.  Normal affect.  Labs    CBC  Recent Labs  07/09/17 0504  WBC 11.4*  HGB 12.7*  HCT 40.1  MCV 90.7  PLT 628   Basic Metabolic Panel  Recent Labs  07/09/17 1935 07/10/17 0445  NA 136 137  K 4.1 4.3  CL 96* 96*  CO2 34* 35*  GLUCOSE 150* 119*  BUN 32* 28*  CREATININE 1.31* 1.11  CALCIUM 8.7* 8.8*   Liver Function Tests No results for input(s): AST, ALT, ALKPHOS, BILITOT, PROT, ALBUMIN in the last 72 hours. No results for input(s): LIPASE, AMYLASE in the last 72 hours. Cardiac Enzymes No results for input(s): CKTOTAL, CKMB, CKMBINDEX, TROPONINI in the last 72 hours. BNP Invalid input(s): POCBNP D-Dimer No results for input(s): DDIMER in the last 72 hours. Hemoglobin A1C No results for input(s): HGBA1C in the last 72 hours. Fasting Lipid Panel No  results for input(s): CHOL, HDL, LDLCALC, TRIG, CHOLHDL, LDLDIRECT in the last 72 hours. Thyroid Function Tests No results for input(s): TSH, T4TOTAL, T3FREE, THYROIDAB in the last 72 hours.  Invalid input(s): FREET3  Telemetry    NSR - Personally Reviewed  ECG    sinus with 1st deg AV block, LVH with repol abnormality, HR 74 - Personally Reviewed  Radiology    Dg Chest Port 1v Same Day  Result Date: 07/09/2017 CLINICAL DATA:  Shortness of breath.  Hypoxia. EXAM: PORTABLE CHEST 1 VIEW COMPARISON:  None. FINDINGS: There is a large right pleural effusion with associated airspace disease. Cardiomegaly and interstitial edema are identified. No pneumothorax. No left effusion. Large right pleural effusion and associated airspace disease which could be atelectasis or pneumonia. Cardiomegaly and interstitial pulmonary edema. IMPRESSION: No active disease. Electronically Signed    By: Inge Rise M.D.   On: 07/09/2017 23:05    Cardiac Studies   2D ECHO: 07/09/2017 LV EF: 25% -   30% Study Conclusion - Left ventricle: The cavity size was mildly dilated. Wall   thickness was increased in a pattern of mild LVH. Systolic   function was severely reduced. The estimated ejection fraction   was in the range of 25% to 30%. Diffuse hypokinesis. - Aortic valve: Valve mobility was restricted. There was severe   stenosis. There was mild to moderate regurgitation. Valve area   (VTI): 0.41 cm^2. Valve area (Vmax): 0.35 cm^2. Valve area   (Vmean): 0.39 cm^2. - Left atrium: The atrium was moderately dilated. - Right ventricle: The cavity size was moderately dilated. Systolic   function was moderately reduced. - Right atrium: The atrium was moderately dilated. - Pulmonary arteries: Systolic pressure was severely increased. PA   peak pressure: 79 mm Hg (S). - Pericardium, extracardiac: There was a left pleural effusion. Impressions: - Severe global reduction in LV systolic function; mild LVE and   LVH; calcified aortic valve with severe AS and mild to moderate   AI; moderate LAE; moderate RVE with moderately reduced function;   mild TR with severely elevated pulmonary pressure.   Patient Profile     Edward Strickland is a 76 y.o. male with a hx of HTN, aortic stenosis, and COPD who was transferred to [facility] from [place] for further work up of his severe AS and new systolic CHF.   Assessment & Plan    Severe symptomatic stage D AS: echo from [facility] showed severe aortic stenosis with peak gradient of 89 mmHg and mean transvalvular gradient of 51 mmHg. Repeat echo here shows the aortic valve is severely calcified and markedly restricted. There is at least mild aortic insufficiency. DVI 0.16. Plan is for [name] today. Following cath plan for CT angiography studies, PFTs, and cardiac surgical evaluation.  New systolic CHF: EF 81-19% by echo. Likely LV dilation 2/2 severe AS. CXR  yesterday showed large R pleural effusion and pulmonary vascular congestion. IV Lasix increased to 40mg  BID. Net neg 1.31L. Weights appear inaccurate (150--> 152lbs). He appears comfortable and thinks he can lay flat for cath. With large right pleural effusion on CXR will give an extra IV lasix 20mg  now. L/RHC planned for later this afternoon   AKI: creat improving with diuresis 1.3--> 1.11.   COPD: chart history of this. He did smoke remotely. Will get PFTs as a part of the pre TAVR work up.   HTN: BP well controlled today  NSVT: 7 beats noted on tele yesterday. No further ectopy. Continue BB  Signed,  Angelena Form, PA-C  07/10/2017, 9:26 AM   Patient seen, examined. Available data reviewed. Agree with findings, assessment, and plan as outlined by Nell Range, PA-C. Exam changes made where appropriate above. The heart is regular rate and rhythm with a late peaking 3/6 harsh systolic murmur at the left lower sternal border and right upper sternal border. I do not appreciate a diastolic murmur. Breath sounds are diminished at the bases right worse than left. There is 1+ pretibial edema improved from yesterday's exam.  Plans for cardiac catheterization today to assess hemodynamics, right heart pressures, and coronary anatomy. Continue evaluation for aortic valve replacement/TAVR with plans for CT angios studies and PFTs. Following cardiac catheterization will request surgical consultation for multidisciplinary heart team approach in this complex patient with acute on chronic systolic heart failure, severe LV dysfunction, and probably critical aortic stenosis.  CXR reveals large right pleural effusion. Suspect this is secondary to CHF/high RA pressures. Await cath result but thoracentesis will likely help his respiratory status as well.  Sherren Mocha, M.D. 07/10/2017 11:38 AM

## 2017-07-10 NOTE — Progress Notes (Addendum)
Patient Name: Edward Strickland Date of Encounter: 07/10/2017  Primary Cardiologist: Dr. Tyrell Antonio Problem List     Principal Problem:   Acute on chronic systolic CHF (congestive heart failure) ([place]) Active Problems:   Aortic stenosis, severe   CKD (chronic kidney disease), stage III   Essential hypertension   COPD (chronic obstructive pulmonary disease) (HCC)     Subjective   No complaints. Breathing much better. LE edema improving. He thinks he can lie flat for cath.   Inpatient Medications    Scheduled Meds: . aspirin EC  81 mg Oral Daily  . enoxaparin (LOVENOX) injection  40 mg Subcutaneous Q24H  . furosemide  40 mg Intravenous BID  . lisinopril  10 mg Oral Daily  . metoprolol tartrate  25 mg Oral Daily  . potassium chloride  20 mEq Oral BID   Continuous Infusions:  PRN Meds: acetaminophen **OR** acetaminophen, ondansetron **OR** ondansetron (ZOFRAN) IV   Vital Signs    Vitals:   07/09/17 0918 07/09/17 1100 07/09/17 2003 07/10/17 0531  BP: (!) 112/58 (!) 93/52 (!) 105/49 (!) 110/54  Pulse: 89 70 74 81  Resp:  18 17 18   Temp:  (!) 97.3 F (36.3 C) 98 F (36.7 C) 97.9 F (36.6 C)  TempSrc:  Oral Oral Oral  SpO2: 98% 94% 98% 96%  Weight:    152 lb 8 oz (69.2 kg)  Height:        Intake/Output Summary (Last 24 hours) at 07/10/17 0926 Last data filed at 07/10/17 0530  Gross per 24 hour  Intake                0 ml  Output             1725 ml  Net            -1725 ml   Filed Weights   07/09/17 0017 07/10/17 0531  Weight: 150 lb 14.4 oz (68.4 kg) 152 lb 8 oz (69.2 kg)    Physical Exam   GEN: Well nourished, well developed, in no acute distress.  HEENT: Grossly normal.  Neck: Supple, no JVD, carotid upstrokes delayed with bilateral bruits. no masses. Cardiac: RRR, 3/6 harsh late peaking systolic murmur at RUSB/LLSB. No rubs or gallops. No clubbing, cyanosis. 1 + pretibial edema.  Radials/DP/PT 2+ and equal bilaterally.  Respiratory:  Respirations  regular and unlabored, diminished breath sounds Right > L GI: Soft, nontender, nondistended, BS + x 4. MS: no deformity or atrophy. Skin: warm and dry, no rash. chronic stasis changes L>R Neuro:  Strength and sensation are intact. Psych: AAOx3.  Normal affect.  Labs    CBC  Recent Labs  07/09/17 0504  WBC 11.4*  HGB 12.7*  HCT 40.1  MCV 90.7  PLT 761   Basic Metabolic Panel  Recent Labs  07/09/17 1935 07/10/17 0445  NA 136 137  K 4.1 4.3  CL 96* 96*  CO2 34* 35*  GLUCOSE 150* 119*  BUN 32* 28*  CREATININE 1.31* 1.11  CALCIUM 8.7* 8.8*   Liver Function Tests No results for input(s): AST, ALT, ALKPHOS, BILITOT, PROT, ALBUMIN in the last 72 hours. No results for input(s): LIPASE, AMYLASE in the last 72 hours. Cardiac Enzymes No results for input(s): CKTOTAL, CKMB, CKMBINDEX, TROPONINI in the last 72 hours. BNP Invalid input(s): POCBNP D-Dimer No results for input(s): DDIMER in the last 72 hours. Hemoglobin A1C No results for input(s): HGBA1C in the last 72 hours. Fasting Lipid Panel No  results for input(s): CHOL, HDL, LDLCALC, TRIG, CHOLHDL, LDLDIRECT in the last 72 hours. Thyroid Function Tests No results for input(s): TSH, T4TOTAL, T3FREE, THYROIDAB in the last 72 hours.  Invalid input(s): FREET3  Telemetry    NSR - Personally Reviewed  ECG    sinus with 1st deg AV block, LVH with repol abnormality, HR 74 - Personally Reviewed  Radiology    Dg Chest Port 1v Same Day  Result Date: 07/09/2017 CLINICAL DATA:  Shortness of breath.  Hypoxia. EXAM: PORTABLE CHEST 1 VIEW COMPARISON:  None. FINDINGS: There is a large right pleural effusion with associated airspace disease. Cardiomegaly and interstitial edema are identified. No pneumothorax. No left effusion. Large right pleural effusion and associated airspace disease which could be atelectasis or pneumonia. Cardiomegaly and interstitial pulmonary edema. IMPRESSION: No active disease. Electronically Signed    By: Inge Rise M.D.   On: 07/09/2017 23:05    Cardiac Studies   2D ECHO: 07/09/2017 LV EF: 25% -   30% Study Conclusion - Left ventricle: The cavity size was mildly dilated. Wall   thickness was increased in a pattern of mild LVH. Systolic   function was severely reduced. The estimated ejection fraction   was in the range of 25% to 30%. Diffuse hypokinesis. - Aortic valve: Valve mobility was restricted. There was severe   stenosis. There was mild to moderate regurgitation. Valve area   (VTI): 0.41 cm^2. Valve area (Vmax): 0.35 cm^2. Valve area   (Vmean): 0.39 cm^2. - Left atrium: The atrium was moderately dilated. - Right ventricle: The cavity size was moderately dilated. Systolic   function was moderately reduced. - Right atrium: The atrium was moderately dilated. - Pulmonary arteries: Systolic pressure was severely increased. PA   peak pressure: 79 mm Hg (S). - Pericardium, extracardiac: There was a left pleural effusion. Impressions: - Severe global reduction in LV systolic function; mild LVE and   LVH; calcified aortic valve with severe AS and mild to moderate   AI; moderate LAE; moderate RVE with moderately reduced function;   mild TR with severely elevated pulmonary pressure.   Patient Profile     Edward Strickland is a 76 y.o. male with a hx of HTN, aortic stenosis, and COPD who was transferred to [facility] from [place] for further work up of his severe AS and new systolic CHF.   Assessment & Plan    Severe symptomatic stage D AS: echo from [facility] showed severe aortic stenosis with peak gradient of 89 mmHg and mean transvalvular gradient of 51 mmHg. Repeat echo here shows the aortic valve is severely calcified and markedly restricted. There is at least mild aortic insufficiency. DVI 0.16. Plan is for [facility] [place] today. Following cath plan for CT angiography studies, PFTs, and cardiac surgical evaluation.  New systolic CHF: EF 10-25% by echo. Likely LV dilation 2/2 severe AS. CXR  yesterday showed large R pleural effusion and pulmonary vascular congestion. IV Lasix increased to 40mg  BID. Net neg 1.31L. Weights appear inaccurate (150--> 152lbs). He appears comfortable and thinks he can lay flat for cath. With large right pleural effusion on CXR will give an extra IV lasix 20mg  now. L/RHC planned for later this afternoon   AKI: creat improving with diuresis 1.3--> 1.11.   COPD: chart history of this. He did smoke remotely. Will get PFTs as a part of the pre TAVR work up.   HTN: BP well controlled today  NSVT: 7 beats noted on tele yesterday. No further ectopy. Continue BB  Signed,  Angelena Form, PA-C  07/10/2017, 9:26 AM   Patient seen, examined. Available data reviewed. Agree with findings, assessment, and plan as outlined by Nell Range, PA-C. Exam changes made where appropriate above. The heart is regular rate and rhythm with a late peaking 3/6 harsh systolic murmur at the left lower sternal border and right upper sternal border. I do not appreciate a diastolic murmur. Breath sounds are diminished at the bases right worse than left. There is 1+ pretibial edema improved from yesterday's exam.  Plans for cardiac catheterization today to assess hemodynamics, right heart pressures, and coronary anatomy. Continue evaluation for aortic valve replacement/TAVR with plans for CT angios studies and PFTs. Following cardiac catheterization will request surgical consultation for multidisciplinary heart team approach in this complex patient with acute on chronic systolic heart failure, severe LV dysfunction, and probably critical aortic stenosis.  CXR reveals large right pleural effusion. Suspect this is secondary to CHF/high RA pressures. Await cath result but thoracentesis will likely help his respiratory status as well.  Sherren Mocha, M.D. 07/10/2017 11:38 AM

## 2017-07-10 NOTE — Consult Note (Signed)
DENTAL CONSULTATION  Date of Consultation:  07/10/2017 Patient Name:   Edward Strickland Date of Birth:   1941-02-13 Medical Record Number: [id number]  VITALS: BP (!) 118/52 (BP Location: Left Arm)   Pulse 73   Temp 97.7 F (36.5 C) (Oral)   Resp 16   Ht 5\' 6"  (1.676 m)   Wt 152 lb 8 oz (69.2 kg) Comment: c scale  SpO2 99%   BMI 24.61 kg/m   CHIEF COMPLAINT: Patient referred by Dr. Burt Knack for dental consultation.  HPI: Edward Strickland is a 76 year old male recently diagnosed with critical aortic stenosis. Patient with anticipated heart valve surgery.  Patient is now seen as part of a medically necessary pre-heart valve surgery dental protocol examination to rule out dental infection that may affect a systemic health and anticipated heart valve surgery.  The patient currently denies acute toothaches, swellings, or abscesses.  Patient indicates that it has been at least 5 years since he has seen a dentist. Patient denies having any partial dentures. Patient denies having dental phobia. The patient thinks that he may need to get "all my teeth pulled out " at this time.  PROBLEM LIST: Patient Active Problem List   Diagnosis Date Noted  . Acute on chronic systolic CHF (congestive heart failure) ([place]) 07/09/2017  . Aortic stenosis, severe 07/09/2017  . CKD (chronic kidney disease), stage III 07/09/2017  . Essential hypertension 07/09/2017  . COPD (chronic obstructive pulmonary disease) ([place]) 07/09/2017    PMH: Past Medical History:  Diagnosis Date  . Aortic stenosis, severe   . Chronic systolic CHF (congestive heart failure) ([place])   . COPD (chronic obstructive pulmonary disease) ([place])   . Hypertension     PSH: History reviewed. No pertinent surgical history.  ALLERGIES: No Known Allergies  MEDICATIONS: Current Facility-Administered Medications  Medication Dose Route Frequency Provider Last Rate Last Dose  . 0.9 %  sodium chloride infusion  250 mL Intravenous PRN Sherren Mocha,  MD      . acetaminophen (TYLENOL) tablet 650 mg  650 mg Oral Q6H PRN Danford, Suann Larry, MD       Or  . acetaminophen (TYLENOL) suppository 650 mg  650 mg Rectal Q6H PRN Danford, Suann Larry, MD      . aspirin EC tablet 81 mg  81 mg Oral Daily Edwin Dada, MD   81 mg at 07/10/17 7829  . enoxaparin (LOVENOX) injection 40 mg  40 mg Subcutaneous Q24H Danford, Suann Larry, MD   40 mg at 07/09/17 1248  . furosemide (LASIX) injection 20 mg  20 mg Intravenous Once Eileen Stanford, PA-C      . furosemide (LASIX) injection 40 mg  40 mg Intravenous BID Sherren Mocha, MD   40 mg at 07/10/17 5621  . metoprolol tartrate (LOPRESSOR) tablet 25 mg  25 mg Oral Daily Edwin Dada, MD   25 mg at 07/10/17 0809  . ondansetron (ZOFRAN) tablet 4 mg  4 mg Oral Q6H PRN Danford, Suann Larry, MD       Or  . ondansetron (ZOFRAN) injection 4 mg  4 mg Intravenous Q6H PRN Danford, Suann Larry, MD      . potassium chloride SA (K-DUR,KLOR-CON) CR tablet 20 mEq  20 mEq Oral BID Edwin Dada, MD   20 mEq at 07/10/17 0808  . sodium chloride flush (NS) 0.9 % injection 3 mL  3 mL Intravenous Q12H Sherren Mocha, MD   3 mL at 07/10/17 1345  . sodium chloride flush (NS)  0.9 % injection 3 mL  3 mL Intravenous PRN Sherren Mocha, MD        LABS: Lab Results  Component Value Date   WBC 11.4 (H) 07/09/2017   HGB 12.7 (L) 07/09/2017   HCT 40.1 07/09/2017   MCV 90.7 07/09/2017   PLT 234 07/09/2017      Component Value Date/Time   NA 137 07/10/2017 0445   K 4.3 07/10/2017 0445   CL 96 (L) 07/10/2017 0445   CO2 35 (H) 07/10/2017 0445   GLUCOSE 119 (H) 07/10/2017 0445   BUN 28 (H) 07/10/2017 0445   CREATININE 1.11 07/10/2017 0445   CALCIUM 8.8 (L) 07/10/2017 0445   GFRNONAA >60 07/10/2017 0445   GFRAA >60 07/10/2017 0445   Lab Results  Component Value Date   INR 1.12 07/10/2017   No results found for: PTT  SOCIAL HISTORY: Social History   Social History  . Marital  status: Single    Spouse name: N/A  . Number of children: N/A  . Years of education: N/A   Occupational History  . Not on file.   Social History Main Topics  . Smoking status: Former Research scientist (life sciences)  . Smokeless tobacco: Never Used  . Alcohol use Not on file  . Drug use: Unknown  . Sexual activity: Not on file   Other Topics Concern  . Not on file   Social History Narrative  . No narrative on file    FAMILY HISTORY: Family History  Problem Relation Age of Onset  . Congestive Heart Failure Paternal Uncle     REVIEW OF SYSTEMS: Reviewed with the patient as per History of present illness. Psych: Patient denies having dental phobia.  DENTAL HISTORY: CHIEF COMPLAINT: Patient referred by Dr. Burt Knack for dental consultation.  HPI: Edward Strickland is a 76 year old male recently diagnosed with critical aortic stenosis. Patient with anticipated heart valve surgery.  Patient is now seen as part of a medically necessary pre-heart valve surgery dental protocol examination to rule out dental infection that may affect a systemic health and anticipated heart valve surgery.  The patient currently denies acute toothaches, swellings, or abscesses.  Patient indicates that it has been at least 5 years since he has seen a dentist. Patient denies having any partial dentures. Patient denies having dental phobia. The patient thinks that he may need to get "all my teeth pulled out " at this time.   DENTAL EXAMINATION: GENERAL: Patient is a well-developed, well-nourished male in no acute distress. HEAD AND NECK: There is no palpable neck lymphadenopathy. The patient denies acute TMJ symptoms. INTRAORAL EXAM: The patient has normal saliva. There is no evidence of oral abscess formation. DENTITION: Patient with multiple missing teeth. There is a retained root segment.Multiple diastemas are noted. PERIODONTAL: Patient has chronic periodontitis with plaque and calculus accumulations, generalized gingival  recession, and tooth mobility. There is moderate to severe bone loss noted. DENTAL CARIES/SUBOPTIMAL RESTORATIONS: Multiple dental caries are noted. ENDODONTIC: The patient currently denies acute pulpitis symptoms.  There are multiple areas periapical pathology and radiolucency. CROWN AND BRIDGE: There are no crown or bridge restorations. PROSTHODONTIC: Patient denies having partial dentures. OCCLUSION: Patient has a poor occlusal scheme secondary to multiple missing teeth, supra-eruption and drifting of the unopposed teeth into the edentulous areas, multiple diastemas, and lack of replacement of missing teeth with dental prostheses.  RADIOGRAPHIC INTERPRETATION: An orthopantogram was taken on 07/10/2017.  There are multiple missing teeth. There are multiple diastemas noted. There is a retained root segment. There  is moderate to severe bone loss. There are multiple areas of periapical radiolucency and pathology. There is pneumatization of the bilateral maxillary sinuses. Radiographic calculus is noted.  ASSESSMENTS: 1. Critical aortic stenosis 2. Pre-heart valve surgery dental protocol 3. Chronic periodontitis with bone loss 4. Generalized gingival recession 5. Accretions 6. Loose teeth 7. Retained root segment 8. Dental caries 9. Multiple missing teeth 10. Supra-eruption and drifting of the unopposed teeth into the edentulous areas 11. No history of partial dentures 12. Malocclusion 13. Pneumatization the maxillary sinuses with risk for sinus perforation upon extraction of upper right premolar and upper left molar. 13. Risk for complications with anticipated invasive dental procedures in the operating room with general anesthesia up to and including death due to his overall cardiovascular compromise.   PLAN/RECOMMENDATIONS: 1. I discussed the risks, benefits, and complications of various treatment options with the patient in relationship to his medical and dental conditions, anticipated  aortic valve replacement, and risk for endocarditis. We discussed various treatment options to include no treatment, multiple extractions with alveoloplasty, pre-prosthetic surgery as indicated, periodontal therapy, dental restorations, root canal therapy, crown and bridge therapy, implant therapy, and replacement of missing teeth as indicated. The patient currently wishes to proceed with extraction of remaining teeth with alveoloplasty and pre-prosthetic surgery as needed in the operating room with general anesthesia. The operating procedure has been scheduled for Monday, 07/15/2017 at 10:15 AM. The patient will then be scheduled for percutaneous intervention with stenting and the TAVR procedure as indicated with Dr. Burt Knack and Dr. Gilford Raid. Patient is aware that he will need to follow-up for dentist of his choice for fabrication of upper and lower complete dentures after adequate healing and once medically stable from the anticipated heart valve surgery.   2. Discussion of findings with medical team and coordination of future medical and dental care as needed.    Lenn Cal, DDS

## 2017-07-11 ENCOUNTER — Encounter (HOSPITAL_COMMUNITY): Payer: Self-pay | Admitting: Physician Assistant

## 2017-07-11 ENCOUNTER — Inpatient Hospital Stay (HOSPITAL_COMMUNITY): Payer: Medicare Other

## 2017-07-11 DIAGNOSIS — I2510 Atherosclerotic heart disease of native coronary artery without angina pectoris: Secondary | ICD-10-CM

## 2017-07-11 DIAGNOSIS — I350 Nonrheumatic aortic (valve) stenosis: Secondary | ICD-10-CM

## 2017-07-11 HISTORY — PX: IR THORACENTESIS RIGHT ASP PLEURAL SPACE W/IMG GUIDE: IMG5380

## 2017-07-11 LAB — BASIC METABOLIC PANEL
Anion gap: 4 — ABNORMAL LOW (ref 5–15)
BUN: 26 mg/dL — AB (ref 6–20)
CALCIUM: 9.2 mg/dL (ref 8.9–10.3)
CO2: 41 mmol/L — AB (ref 22–32)
Chloride: 92 mmol/L — ABNORMAL LOW (ref 101–111)
Creatinine, Ser: 1.23 mg/dL (ref 0.61–1.24)
GFR calc Af Amer: >60 mL/min (ref >60)
GFR, EST NON AFRICAN AMERICAN: 55 mL/min — AB (ref >60)
GLUCOSE: 103 mg/dL — AB (ref 65–99)
POTASSIUM: 4.5 mmol/L (ref 3.5–5.1)
Sodium: 137 mmol/L (ref 135–145)

## 2017-07-11 LAB — GLUCOSE, PLEURAL OR PERITONEAL FLUID: Glucose, Fluid: 124 mg/dL

## 2017-07-11 LAB — PULMONARY FUNCTION TEST
FEF 25-75 POST: 0.72 L/s
FEF 25-75 PRE: 0.51 L/s
FEF2575-%Change-Post: 41 %
FEF2575-%Pred-Post: 37 %
FEF2575-%Pred-Pre: 26 %
FEV1-%Change-Post: 6 %
FEV1-%PRED-PRE: 27 %
FEV1-%Pred-Post: 28 %
FEV1-PRE: 0.72 L
FEV1-Post: 0.76 L
FEV1FVC-%CHANGE-POST: 5 %
FEV1FVC-%Pred-Pre: 102 %
FEV6-%CHANGE-POST: 0 %
FEV6-%PRED-POST: 28 %
FEV6-%PRED-PRE: 28 %
FEV6-Post: 0.98 L
FEV6-Pre: 0.97 L
FEV6FVC-%Pred-Post: 107 %
FEV6FVC-%Pred-Pre: 107 %
FVC-%CHANGE-POST: 0 %
FVC-%PRED-POST: 26 %
FVC-%Pred-Pre: 26 %
FVC-Post: 0.98 L
FVC-Pre: 0.97 L
POST FEV6/FVC RATIO: 100 %
PRE FEV1/FVC RATIO: 74 %
Post FEV1/FVC ratio: 78 %
Pre FEV6/FVC Ratio: 100 %
RV % pred: 109 %
RV: 2.63 L
TLC % PRED: 53 %
TLC: 3.42 L

## 2017-07-11 LAB — GRAM STAIN: Gram Stain: NONE SEEN

## 2017-07-11 LAB — ALBUMIN, PLEURAL OR PERITONEAL FLUID: Albumin, Fluid: 1.6 g/dL

## 2017-07-11 LAB — BODY FLUID CELL COUNT WITH DIFFERENTIAL
LYMPHS FL: 66 %
MONOCYTE-MACROPHAGE-SEROUS FLUID: 22 % — AB (ref 50–90)
Neutrophil Count, Fluid: 12 % (ref 0–25)
WBC FLUID: 153 uL (ref 0–1000)

## 2017-07-11 LAB — LACTATE DEHYDROGENASE, PLEURAL OR PERITONEAL FLUID: LD FL: 49 U/L — AB (ref 3–23)

## 2017-07-11 LAB — PROTEIN, PLEURAL OR PERITONEAL FLUID

## 2017-07-11 MED ORDER — ATORVASTATIN CALCIUM 80 MG PO TABS
80.0000 mg | ORAL_TABLET | Freq: Every day | ORAL | Status: DC
  Administered 2017-07-11 – 2017-07-26 (×14): 80 mg via ORAL
  Filled 2017-07-11 (×13): qty 1

## 2017-07-11 MED ORDER — LIDOCAINE HCL (PF) 1 % IJ SOLN
INTRAMUSCULAR | Status: DC | PRN
  Administered 2017-07-11: 10 mL

## 2017-07-11 MED ORDER — ALBUTEROL SULFATE (2.5 MG/3ML) 0.083% IN NEBU
2.5000 mg | INHALATION_SOLUTION | Freq: Once | RESPIRATORY_TRACT | Status: AC
  Administered 2017-07-11: 2.5 mg via RESPIRATORY_TRACT

## 2017-07-11 MED ORDER — FUROSEMIDE 40 MG PO TABS
40.0000 mg | ORAL_TABLET | Freq: Two times a day (BID) | ORAL | Status: DC
  Administered 2017-07-11 – 2017-07-12 (×2): 40 mg via ORAL
  Filled 2017-07-11 (×2): qty 1

## 2017-07-11 MED ORDER — METOPROLOL TARTRATE 12.5 MG HALF TABLET
12.5000 mg | ORAL_TABLET | Freq: Two times a day (BID) | ORAL | Status: DC
  Administered 2017-07-11 – 2017-07-16 (×6): 12.5 mg via ORAL
  Filled 2017-07-11 (×10): qty 1

## 2017-07-11 MED ORDER — LIDOCAINE HCL (PF) 1 % IJ SOLN
INTRAMUSCULAR | Status: AC
  Filled 2017-07-11: qty 30

## 2017-07-11 NOTE — Consult Note (Signed)
   [facility] CM Inpatient Consult   07/11/2017  Edward Strickland 08/11/1941 [id number]   Patient screened for potential [facility] Care Management services with new heart failure and reported with minimal support.  Patient is in the Medicare  ACO plan. Met with the patient at the bedside.  Patient states is able to state his name and address.  He states he has friends that help him out with transportation and help him get his medications.  He uses [facility] in [place] for his medications.  He states he will likely stay with [place], his friend, at discharge.  He gave this slip of paper with her phone number on it.  Patient accepted follow up General EMMI calls @ [phone]. His primary care provider is Jacqueline Mawoneke, NP @ [facility].  For questions contact:    , RN BSN CCM [facility] Liaison  [phone] business mobile phone Toll free office [phone]    

## 2017-07-11 NOTE — Consult Note (Signed)
CARDIOTHORACIC SURGERY CONSULTATION REPORT  Referring Provider is Sherren Mocha, MD PCP is Jaclynn Major, NP  Reason for consultation: Severe aortic stenosis  HPI:  The patient is a 76 year old gentleman with hypertension, remote smoking with COPD and a history of severe aortic stenosis documented by echo in 09/2015 when his EF was 60-65% with a mean aortic valve gradient of 66 mm Hg and an AVA of 0.44 cm2. He has been followed by his PCP in [place] but has not been followed by cardiology. He reports having a long history of shortness of breath with exertion and lower extremity edema. He is not that active and walks with a cane for balance. He reports a several week history of worsening SOB and leg edema. He presented to [facility] on 07/07/2017 and was found to be hypoxic with sats of 86% on room air had a CXR showing CHF with bilateral pleural effusions R>L, a proBNP of 28,800, troponin of 0.07. ECG was non-specific. He was diuresed and had an echo showing a drop in his LVEF to 20-25% with a moderately enlarged RV with moderate systolic dysfunction. He had severe AS with a mean gradient of 51 mm Hg with an AVA of 0.41 cm2, moderate to severe TR, mild MR and moderate pulmonary HTN. He was transferred to [facility] for further care. A repeat echo on 9/11 showed a mean AV gradient of 36 mm Hg with a DI of 0.16, AVA of 0.35 cm2 with severely calcified and restricted leaflets. LVEF was 25-30%. There was trivial MR, mild TR and severe pulmonary HTN. He underwent R and L heart cath yesterday showing a 90% proximal to mid LAD stenosis and mild diffuse nonobstructive disease in the RCA and LCX. There was severe pulmonary HTN at 79/39 with a PCW mean of 36 mm Hg. CI was 2.48.  The patient lives alone in [place] and is divorced with no children. He has a friend, [name], who lives close by and checks on him. He is retired from [employer].  Past Medical History:  Diagnosis Date  . Aortic stenosis, severe    . CAD (coronary artery disease)   . Chronic systolic CHF (congestive heart failure) ([place])   . COPD (chronic obstructive pulmonary disease) ([place])   . Hypertension     History reviewed. No pertinent surgical history.  Family History  Problem Relation Age of Onset  . Congestive Heart Failure Paternal Uncle     Social History   Social History  . Marital status: Single    Spouse name: N/A  . Number of children: N/A  . Years of education: N/A   Occupational History  . Not on file.   Social History Main Topics  . Smoking status: Former Research scientist (life sciences)  . Smokeless tobacco: Never Used  . Alcohol use Not on file  . Drug use: Unknown  . Sexual activity: Not on file   Other Topics Concern  . Not on file   Social History Narrative  . No narrative on file    Current Facility-Administered Medications  Medication Dose Route Frequency Provider Last Rate Last Dose  . 0.9 %  sodium chloride infusion  250 mL Intravenous PRN Sherren Mocha, MD      . acetaminophen (TYLENOL) tablet 650 mg  650 mg Oral Q6H PRN Danford, Suann Larry, MD       Or  . acetaminophen (TYLENOL) suppository 650 mg  650 mg Rectal Q6H PRN Danford, Suann Larry, MD      .  aspirin EC tablet 81 mg  81 mg Oral Daily Edwin Dada, MD   81 mg at 07/11/17 0859  . atorvastatin (LIPITOR) tablet 80 mg  80 mg Oral q1800 Angelena Form R, PA-C      . enoxaparin (LOVENOX) injection 40 mg  40 mg Subcutaneous Q24H Danford, Suann Larry, MD   40 mg at 07/11/17 1200  . furosemide (LASIX) injection 20 mg  20 mg Intravenous Once Eileen Stanford, PA-C      . furosemide (LASIX) tablet 40 mg  40 mg Oral BID Angelena Form R, PA-C      . lidocaine (PF) (XYLOCAINE) 1 % injection           . lidocaine (PF) (XYLOCAINE) 1 % injection    PRN Ardis Rowan, PA-C   10 mL at 07/11/17 1402  . metoprolol tartrate (LOPRESSOR) tablet 12.5 mg  12.5 mg Oral BID Angelena Form R, PA-C      . ondansetron [facility]) tablet 4 mg   4 mg Oral Q6H PRN Danford, Suann Larry, MD       Or  . ondansetron (ZOFRAN) injection 4 mg  4 mg Intravenous Q6H PRN Danford, Suann Larry, MD      . potassium chloride SA (K-DUR,KLOR-CON) CR tablet 20 mEq  20 mEq Oral BID Edwin Dada, MD   20 mEq at 07/11/17 0859  . sodium chloride flush (NS) 0.9 % injection 3 mL  3 mL Intravenous Q12H Sherren Mocha, MD   3 mL at 07/11/17 0901  . sodium chloride flush (NS) 0.9 % injection 3 mL  3 mL Intravenous PRN Sherren Mocha, MD        No Known Allergies    Review of Systems:   General:  normal appetite, reduced energy, fluid weight gain, no weight loss, no fever  Cardiac:  Had some chest pain with exertion,  No chest pain at rest, has SOB with mild exertion, some resting SOB, has PND, has orthopnea, no palpitations, no arrhythmia, no atrial fibrillation, has LE edema, no dizzy spells, no syncope  Respiratory:  Exertional and rest shortness of breath, no home oxygen, no productive cough, has dry cough, no bronchitis, no wheezing, no hemoptysis, no asthma, no pain with inspiration or cough, no sleep apnea, no CPAP at night  GI:   no difficulty swallowing, no reflux, no frequent heartburn, no hiatal hernia, no abdominal pain, no constipation, no diarrhea, no hematochezia, no hematemesis, no melena  GU:   no dysuria,  no frequency, no urinary tract infection, no hematuria, no enlarged prostate, no kidney stones, no kidney disease  Vascular:  no pain suggestive of claudication, no pain in feet, no leg cramps, no varicose veins, no DVT, no non-healing foot ulcer  Neuro:   no stroke, no TIA's, no seizures, no headaches, no temporary blindness one eye,  no slurred speech, no peripheral neuropathy, no chronic pain, some instability of gait, no memory/cognitive dysfunction  Musculoskeletal: no arthritis, no joint swelling, no myalgias, some difficulty walking, reduced mobility   Skin:   no rash, no itching, no skin infections, no pressure sores  or ulcerations  Psych:   no anxiety, no depression, no nervousness, no unusual recent stress  Eyes:   no blurry vision, no floaters, no recent vision changes, does not wear glasses or contacts  ENT:   some hearing loss, has loose or painful teeth, no dentures, last saw dentist many years ago  Hematologic:  no easy bruising, no abnormal bleeding, no  clotting disorder, no frequent epistaxis  Endocrine:  no diabetes, does not check CBG's at home        Physical Exam:   BP 106/60 (BP Location: Left Arm)   Pulse 76   Temp 98 F (36.7 C) (Oral)   Resp 18   Ht 5\' 6"  (1.676 m)   Wt 68.7 kg (151 lb 6.4 oz) Comment: c scale  SpO2 96%   BMI 24.44 kg/m   General:  Disheveled, frail and chronically ill-appearing  HEENT:  NCAT, PERLA, EOMI, oropharynx with poor dentition and missing teeth.  Neck:   no JVD, no bruits, no adenopathy or thromegaly  Chest:   Rhonchi on the right  CV:   RRR, grade III/VI crescendo/decrescendo murmur heard best at RSB,  no diastolic murmur  Abdomen:  soft, non-tender, no masses or organomegaly  Extremities:  warm, well-perfused, pulses not palpable, mild bilateral LE edema  Rectal/GU  Deferred  Neuro:   Grossly non-focal and symmetrical throughout  Skin:   Clean and dry, no rashes, no breakdown   Diagnostic Tests:              *[facility]*                         [address], [place] [address]                            [phone]  ------------------------------------------------------------------- Transthoracic Echocardiography  Patient:    Nizar, Cutler MR #:       [id number] Study Date: 07/09/2017 Gender:     M Age:        63 Height:     167.6 cm Weight:     68.4 kg BSA:        1.79 m^2 Pt. Status: Room:       3E12C   ADMITTING    Waldemar Dickens  ATTENDING    Waldemar Dickens  PERFORMING   [facility], Inpatient  SONOGRAPHER  Alvino Chapel, RCS  ORDERING      [place] Silos, Katie  Sharl Ma, Katie  cc:  ------------------------------------------------------------------- LV EF: 25% -   30%  ------------------------------------------------------------------- Indications:      Aortic stenosis 424.1.  ------------------------------------------------------------------- History:   PMH:  Acquired from the patient and from the patient&'s chart.  Congestive heart failure.  Chronic obstructive pulmonary disease.  Risk factors:  Hypertension.  ------------------------------------------------------------------- Study Conclusions  - Left ventricle: The cavity size was mildly dilated. Wall   thickness was increased in a pattern of mild LVH. Systolic   function was severely reduced. The estimated ejection fraction   was in the range of 25% to 30%. Diffuse hypokinesis. - Aortic valve: Valve mobility was restricted. There was severe   stenosis. There was mild to moderate regurgitation. Valve area   (VTI): 0.41 cm^2. Valve area (Vmax): 0.35 cm^2. Valve area   (Vmean): 0.39 cm^2. - Left atrium: The atrium was moderately dilated. - Right ventricle: The cavity size was moderately dilated. Systolic   function was moderately reduced. - Right atrium: The atrium was moderately dilated. - Pulmonary arteries: Systolic pressure was severely increased. PA  peak pressure: 79 mm Hg (S). - Pericardium, extracardiac: There was a left pleural effusion.  Impressions:  - Severe global reduction in LV systolic function; mild LVE and   LVH; calcified aortic valve with severe AS and mild to moderate   AI; moderate LAE; moderate RVE with moderately reduced function;   mild TR with severely elevated pulmonary pressure.  ------------------------------------------------------------------- Study data:  No prior study was available for comparison.  Study status:  Routine.  Procedure:  The patient reported no pain pre or post test. Transthoracic  echocardiography. Image quality was adequate.  Study completion:  There were no complications. Transthoracic echocardiography.  M-mode, complete 2D, spectral Doppler, and color Doppler.  Birthdate:  Patient birthdate: Jan 16, 1941.  Age:  Patient is 76 yr old.  Sex:  Gender: male. BMI: 24.4 kg/m^2.  Blood pressure:     93/52  Patient status: Inpatient.  Study date:  Study date: 07/09/2017. Study time: 03:37 PM.  Location:  Bedside.  -------------------------------------------------------------------  ------------------------------------------------------------------- Left ventricle:  The cavity size was mildly dilated. Wall thickness was increased in a pattern of mild LVH. Systolic function was severely reduced. The estimated ejection fraction was in the range of 25% to 30%. Diffuse hypokinesis.  ------------------------------------------------------------------- Aortic valve:   Severely calcified leaflets. Valve mobility was restricted.  Doppler:   There was severe stenosis.   There was mild to moderate regurgitation.    VTI ratio of LVOT to aortic valve: 0.18. Valve area (VTI): 0.41 cm^2. Indexed valve area (VTI): 0.23 cm^2/m^2. Peak velocity ratio of LVOT to aortic valve: 0.16. Valve area (Vmax): 0.35 cm^2. Indexed valve area (Vmax): 0.2 cm^2/m^2. Mean velocity ratio of LVOT to aortic valve: 0.17. Valve area (Vmean): 0.39 cm^2. Indexed valve area (Vmean): 0.22 cm^2/m^2. Mean gradient (S): 36 mm Hg. Peak gradient (S): 66 mm Hg.  ------------------------------------------------------------------- Aorta:  Aortic root: The aortic root was normal in size.  ------------------------------------------------------------------- Mitral valve:   Structurally normal valve.   Mobility was not restricted.  Doppler:  Transvalvular velocity was within the normal range. There was no evidence for stenosis. There was trivial regurgitation.    Indexed valve area by pressure half-time:  1.23 cm^2/m^2.    Peak gradient (D): 5 mm Hg.  ------------------------------------------------------------------- Left atrium:  The atrium was moderately dilated.  ------------------------------------------------------------------- Right ventricle:  The cavity size was moderately dilated. Systolic function was moderately reduced.  ------------------------------------------------------------------- Pulmonic valve:    Doppler:  Transvalvular velocity was within the normal range. There was no evidence for stenosis. There was trivial regurgitation.  ------------------------------------------------------------------- Tricuspid valve:   Structurally normal valve.    Doppler: Transvalvular velocity was within the normal range. There was mild regurgitation.  ------------------------------------------------------------------- Pulmonary artery:   Systolic pressure was severely increased.  ------------------------------------------------------------------- Right atrium:  The atrium was moderately dilated.  ------------------------------------------------------------------- Pericardium:  There was no pericardial effusion.  ------------------------------------------------------------------- Systemic veins: Inferior vena cava: The vessel was normal in size.  ------------------------------------------------------------------- Pleura:  There was a left pleural effusion.  ------------------------------------------------------------------- Measurements   Left ventricle                           Value          Reference  LV ID, ED, PLAX chordal          (H)     53    mm       43 - 52  LV ID, ES, PLAX chordal          (  H)     47    mm       23 - 38  LV fx shortening, PLAX chordal   (L)     11    %        >=29  LV PW thickness, ED                      12    mm       ----------  IVS/LV PW ratio, ED                      0.92           <=1.3  Stroke volume, 2D                         35    ml       ----------  Stroke volume/bsa, 2D                    20    ml/m^2   ----------  LV ejection fraction, 1-p A4C            42    %        ----------  LV end-diastolic volume, 2-p             184   ml       ----------  LV end-systolic volume, 2-p              121   ml       ----------  LV ejection fraction, 2-p                34    %        ----------  Stroke volume, 2-p                       63    ml       ----------  LV end-diastolic volume/bsa, 2-p         103   ml/m^2   ----------  LV end-systolic volume/bsa, 2-p          67    ml/m^2   ----------  Stroke volume/bsa, 2-p                   35.1  ml/m^2   ----------    Ventricular septum                       Value          Reference  IVS thickness, ED                        11    mm       ----------    LVOT                                     Value          Reference  LVOT ID, S                               17    mm       ----------  LVOT area  2.27  cm^2     ----------  LVOT peak velocity, S                    63.4  cm/s     ----------  LVOT mean velocity, S                    47    cm/s     ----------  LVOT VTI, S                              15.5  cm       ----------  LVOT peak gradient, S                    2     mm Hg    ----------    Aortic valve                             Value          Reference  Aortic valve peak velocity, S            406   cm/s     ----------  Aortic valve mean velocity, S            274   cm/s     ----------  Aortic valve VTI, S                      86.2  cm       ----------  Aortic mean gradient, S                  36    mm Hg    ----------  Aortic peak gradient, S                  66    mm Hg    ----------  VTI ratio, LVOT/AV                       0.18           ----------  Aortic valve area, VTI                   0.41  cm^2     ----------  Aortic valve area/bsa, VTI               0.23  cm^2/m^2 ----------  Velocity ratio, peak, LVOT/AV            0.16            ----------  Aortic valve area, peak velocity         0.35  cm^2     ----------  Aortic valve area/bsa, peak              0.2   cm^2/m^2 ----------  velocity  Velocity ratio, mean, LVOT/AV            0.17           ----------  Aortic valve area, mean velocity         0.39  cm^2     ----------  Aortic valve area/bsa, mean              0.22  cm^2/m^2 ----------  velocity  Aortic regurg pressure half-time  171   ms       ----------    Aorta                                    Value          Reference  Aortic root ID, ED                       30    mm       ----------    Left atrium                              Value          Reference  LA ID, A-P, ES                           42    mm       ----------  LA ID/bsa, A-P                   (H)     2.34  cm/m^2   <=2.2  LA volume, S                             82.6  ml       ----------  LA volume/bsa, S                         46.1  ml/m^2   ----------  LA volume, ES, 1-p A4C                   77.9  ml       ----------  LA volume/bsa, ES, 1-p A4C               43.4  ml/m^2   ----------  LA volume, ES, 1-p A2C                   80.8  ml       ----------  LA volume/bsa, ES, 1-p A2C               45    ml/m^2   ----------    Mitral valve                             Value          Reference  Mitral E-wave peak velocity              111   cm/s     ----------  Mitral A-wave peak velocity              42.2  cm/s     ----------  Mitral deceleration time         (L)     106   ms       150 - 230  Mitral pressure half-time                31    ms       ----------  Mitral peak gradient, D                  5  mm Hg    ----------  Mitral E/A ratio, peak                   2.6            ----------  Mitral valve area/bsa, PHT, DP           1.23  cm^2/m^2 ----------    Pulmonary arteries                       Value          Reference  PA pressure, S, DP               (H)     79    mm Hg    <=30    Tricuspid valve                          Value           Reference  Tricuspid regurg peak velocity           400   cm/s     ----------  Tricuspid peak RV-RA gradient            64    mm Hg    ----------    Right atrium                             Value          Reference  RA ID, S-I, ES, A4C              (H)     54.7  mm       34 - 49  RA area, ES, A4C                 (H)     22    cm^2     8.3 - 19.5  RA volume, ES, A/L                       76.2  ml       ----------  RA volume/bsa, ES, A/L                   42.5  ml/m^2   ----------    Systemic veins                           Value          Reference  Estimated CVP                            15    mm Hg    ----------    Right ventricle                          Value          Reference  RV ID, ED, PLAX                          37    mm       19 - 38  TAPSE  13.5  mm       ----------  RV pressure, S, DP               (H)     79    mm Hg    <=30  RV s&', lateral, S                        9.73  cm/s     ----------  Legend: (L)  and  (H)  mark values outside specified reference range.  ------------------------------------------------------------------- Prepared and Electronically Authenticated by  Kirk Ruths 2018-09-11T16:49:04  Physicians   Panel Physicians Referring Physician Case Authorizing Physician  Sherren Mocha, MD (Primary)    Procedures   RIGHT HEART CATH AND CORONARY ANGIOGRAPHY  Conclusion   1. Severe single-vessel coronary artery disease with severe stenosis in the mid LAD and otherwise mild diffuse nonobstructive calcific coronary disease 2. Known severe aortic stenosis with very heavy calcification of the aortic valve on plain fluoroscopy and severely restricted aortic leaflet mobility 3. Severe pulmonary hypertension likely secondary to left heart disease with severely elevated pulmonary capillary wedge pressure  Recommendation: Continued evaluation by the multidisciplinary heart valve team. Likely proceed with PCI of  the LAD followed by TAVR pending cardiac surgical consultation  Indications   Severe aortic stenosis [I35.0 (ICD-10-CM)]  Procedural Details/Technique   Technical Details INDICATION: Severe aortic stenosis/Acute systolic heart failure  PROCEDURAL DETAILS: There was an indwelling IV in a right antecubital vein. Using normal sterile technique, the IV was changed out for a 5 Fr brachial sheath over a 0.018 inch wire. The right wrist was then prepped, draped, and anesthetized with 1% lidocaine. Using the modified Seldinger technique a 5/6 French Slender sheath was placed in the right radial artery. Intra-arterial verapamil was administered through the radial artery sheath. IV heparin was administered after a JR4 catheter was advanced into the central aorta. A Swan-Ganz catheter was used for the right heart catheterization. Standard protocol was followed for recording of right heart pressures and sampling of oxygen saturations. Fick cardiac output was calculated. Standard Judkins catheters were used for selective coronary angiography. There were no immediate procedural complications. The patient was transferred to the post catheterization recovery area for further monitoring.  Note the patient's respiratory status was marginal after receiving low doses of Versed and fentanyl. He required a nonrebreather through much of the procedure. At the completion of the procedure he had an oxygen saturation greater than 90% on oxygen per nasal cannula and he was awake and alert.     Estimated blood loss <50 mL.  During this procedure the patient was administered the following to achieve and maintain moderate conscious sedation: Versed 1 mg, Fentanyl 25 mcg, while the patient's heart rate, blood pressure, and oxygen saturation were continuously monitored. The period of conscious sedation was 31 minutes, of which I was present face-to-face 100% of this time.    Coronary Findings   Dominance: Right  Left Anterior  Descending  Prox LAD to Mid LAD lesion, 90% stenosed. The lesion is calcified. There is a long lesion in the mid LAD with the most severe portion of the lesion approaching 90% stenosis  First Diagonal Branch  Vessel is large in size. The first diagonal is a large branch with no stenosis  Left Circumflex  First Obtuse Marginal Branch  Ost 1st Mrg to 1st Mrg lesion, 40% stenosed.  Right Coronary Artery  There is mild the vessel.  Mid RCA lesion,  50% stenosed. The lesion is calcified.  Right Heart   Right Heart Pressures Hemodynamic findings consistent with severe pulmonary hypertension.    Left Heart   Aortic Valve There is severe aortic valve stenosis. The aortic valve is calcified. There is restricted aortic valve motion.    Coronary Diagrams   Diagnostic Diagram       Implants     No implant documentation for this case.  PACS Images   Show images for CARDIAC CATHETERIZATION   Link to Procedure Log   Procedure Log    Hemo Data    Most Recent Value  Fick Cardiac Output 4.42 L/min  Fick Cardiac Output Index 2.48 (L/min)/BSA  RA A Wave 19 mmHg  RA V Wave 13 mmHg  RA Mean 11 mmHg  RV Systolic Pressure 82 mmHg  RV Diastolic Pressure 12 mmHg  RV EDP 22 mmHg  PA Systolic Pressure 79 mmHg  PA Diastolic Pressure 39 mmHg  PA Mean 52 mmHg  PW A Wave 40 mmHg  PW V Wave 42 mmHg  PW Mean 36 mmHg  AO Systolic Pressure 973 mmHg  AO Diastolic Pressure 48 mmHg  AO Mean 72 mmHg  QP/QS 0.67  TPVR Index 31.07 HRUI  TSVR Index 28.99 HRUI  PVR SVR Ratio 0.39  TPVR/TSVR Ratio 1.07    RISK SCORES About the STS Risk Calculator Procedure: AV Replacement + CAB  Risk of Mortality: 5.77%  Morbidity or Mortality: 33.787%  Long Length of Stay: 19.918%  Short Length of Stay: 17.249%  Permanent Stroke: 2.021%  Prolonged Ventilation: 24.387%  DSW Infection: 0.508%  Renal Failure: 7.949%  Reoperation: 14.094%    Impression:  This 76 year old gentleman has stage D, severe  symptomatic aortic stenosis with severe LV systolic dysfunction presenting with NYHA class IV symptoms consistent with acute on chronic systolic and diastolic congestive heart failure. He reportedly had severe AS in 2016 with a mean gradient of 66 mm Hg and normal LV systolic function. I have personally reviewed his echo and cath films. He has a trileaflet aortic valve with severe calcification and poor mobility of all three leaflets with an LVEF of 25-30%. He has trivial MR and mild TR with moderate RV systolic dysfunction. His cath shows a high grade proximal to mid LAD stenosis with otherwise non-obstructive disease. He has severe pulmonary HTN with a mean PCW of 36 mm Hg. I agree that he requires LAD revascularization and AVR to prevent progressive LV deterioration and death from MI or CHF in the near future. I think his risk for open surgery with CABG and AVR would be at least moderate due to his age, LV dysfunction, RV dysfunction, severe obstructive lung disease by PFT's with an FEV1 of 0.72, frailty and poor mobility. I think the best option is PCI of his LAD and then TAVR. He has terrible dentition and has been seen by Dr. Enrique Sack who plans extraction of his teeth early next week. In the interim he will need a gated cardiac CT and CTA of the chest, abdomen and pelvis to evaluate him further for TAVR.  The patient was counseled at length regarding treatment alternatives for management of severe symptomatic aortic stenosis and coronary disease. The risks and benefits of surgical intervention has been discussed in detail. Long-term prognosis with medical therapy was discussed. Alternative approaches such as conventional surgical aortic valve replacement, transcatheter aortic valve replacement, and palliative medical therapy were compared and contrasted at length. This discussion was placed in the context of  the patient's own specific clinical presentation and past medical history. All of his questions been  addressed.    Plan:  Dental extractions by Dr. Enrique Sack, followed by PCI by Dr. Burt Knack. He will have his CT studies done in the interim. He will then require a second surgical evaluation.  I spent 60 minutes performing this consultation and > 50% of this time was spent face to face counseling and coordinating the care of this patient's severe aortic stenosis and single vessel coronary artery disease.    Gaye Pollack, MD 07/11/2017

## 2017-07-11 NOTE — Evaluation (Signed)
Physical Therapy Evaluation Patient Details Name: Edward Strickland MRN: [id number] DOB: Mar 21, 1941 Today's Date: 07/11/2017   History of Present Illness  Pt is a 76 yo male who presented to ED with SOB and LE swelling, recently diagnosed with critical aortic stenosis, PMH for HTN, CKD, and CHF.   Clinical Impression  Pt admitted with above diagnosis. Pt currently with functional limitations due to the deficits listed below (see PT Problem List). Pt currently limited by 3/4 DoE with mobility. Pt able to transfer with supervision and ambulates 400 feet with min guard assist.  Pt will benefit from skilled PT to increase their independence and safety with mobility to allow discharge to the venue listed below.       Follow Up Recommendations Home health PT;Supervision - Intermittent    Equipment Recommendations  None recommended by PT    Recommendations for Other Services       Precautions / Restrictions Precautions Precautions: Fall Restrictions Weight Bearing Restrictions: No      Mobility  Bed Mobility               General bed mobility comments: in recliner at entry  Transfers Overall transfer level: Needs assistance Equipment used: None Transfers: Sit to/from Stand Sit to Stand: Supervision         General transfer comment: supervision for safety good power up and steadying in standing before reaching for cane  Ambulation/Gait Ambulation/Gait assistance: Min guard Ambulation Distance (Feet): 400 Feet Assistive device: Rolling walker (2 wheeled);Straight cane Gait Pattern/deviations: Step-through pattern;Trunk flexed;Decreased step length - right;Decreased step length - left Gait velocity: slowed Gait velocity interpretation: Below normal speed for age/gender General Gait Details: ambulated 10 feet with cane over to RW and decided to switch for to be more steady, min guard for safety increased hip hike on L side to advance due to limited R LE extension, no  complaints of associated pain with decreased knee flexion., no LOB, no buckling with gait       Balance Overall balance assessment: Needs assistance Sitting-balance support: No upper extremity supported;Feet supported Sitting balance-Leahy Scale: Good     Standing balance support: No upper extremity supported;During functional activity Standing balance-Leahy Scale: Fair                               Pertinent Vitals/Pain Pain Assessment: No/denies pain    Home Living Family/patient expects to be discharged to:: Private residence Living Arrangements: Alone Available Help at Discharge: Neighbor;Available PRN/intermittently Type of Home: House Home Access: Level entry     Home Layout: One level Home Equipment: Cane - single point;Walker - 2 wheels      Prior Function Level of Independence: Needs assistance   Gait / Transfers Assistance Needed: walks with cane or RW as appropriate  ADL's / Homemaking Assistance Needed: drives to restaurant or eats at neighbors house, able to bath and dress but showers at neighbors house  Comments: relies on neighbor [name] for iADLs independent with ADLs        Extremity/Trunk Assessment   Upper Extremity Assessment Upper Extremity Assessment: Generalized weakness    Lower Extremity Assessment Lower Extremity Assessment: RLE deficits/detail;LLE deficits/detail RLE Deficits / Details: R knee lacking 15 degrees of extension, strength grossly 3+/5 LLE Deficits / Details: ROM WFL, strength grossly 4/5       Communication   Communication: No difficulties  Cognition Arousal/Alertness: Awake/alert Behavior During Therapy: WFL for tasks assessed/performed Overall Cognitive  Status: No family/caregiver present to determine baseline cognitive functioning                                 General Comments: sometimes have to repeat or rephrase questions for understanding      General Comments General comments  (skin integrity, edema, etc.): Pt on 3L O2 via nasal cannula, at rest SaO2 94%O2 HR 68bpm, with ambulation SaO2 dropped to 90%O2 with max HR 93bpm        Assessment/Plan    PT Assessment Patient needs continued PT services  PT Problem List Decreased range of motion;Decreased strength       PT Treatment Interventions DME instruction;Gait training;Functional mobility training;Therapeutic activities;Therapeutic exercise;Balance training;Patient/family education    PT Goals (Current goals can be found in the Care Plan section)  Acute Rehab PT Goals Patient Stated Goal: go home PT Goal Formulation: With patient Time For Goal Achievement: 07/25/17 Potential to Achieve Goals: Good    Frequency Min 3X/week   Barriers to discharge Decreased caregiver support         AM-PAC PT "6 Clicks" Daily Activity  Outcome Measure Difficulty turning over in bed (including adjusting bedclothes, sheets and blankets)?: A Little Difficulty moving from lying on back to sitting on the side of the bed? : A Little Difficulty sitting down on and standing up from a chair with arms (e.g., wheelchair, bedside commode, etc,.)?: None Help needed moving to and from a bed to chair (including a wheelchair)?: None Help needed walking in hospital room?: None Help needed climbing 3-5 steps with a railing? : A Little 6 Click Score: 21    End of Session Equipment Utilized During Treatment: Gait belt;Oxygen Activity Tolerance: Patient tolerated treatment well Patient left: in chair;with call bell/phone within reach;with chair alarm set Nurse Communication: Mobility status PT Visit Diagnosis: Other abnormalities of gait and mobility (R26.89);Muscle weakness (generalized) (M62.81);Difficulty in walking, not elsewhere classified (R26.2)    Time: 0175-1025 PT Time Calculation (min) (ACUTE ONLY): 27 min   Charges:   PT Evaluation $PT Eval Moderate Complexity: 1 Mod PT Treatments $Gait Training: 8-22 mins   PT G  Codes:        Marcin Holte B. Migdalia Dk PT, DPT Acute Rehabilitation  [phone] Pager [phone]    [place] 07/11/2017, 12:28 PM

## 2017-07-11 NOTE — Procedures (Signed)
PROCEDURE SUMMARY:  Successful US guided right thoracentesis. Yielded 2 liters of clear orange fluid. Pt tolerated procedure well. No immediate complications.  Specimen was sent for labs. CXR revealed no pneumothorax.  Yerik Zeringue S Ronnald Shedden PA-C 07/11/2017 2:43 PM

## 2017-07-11 NOTE — Progress Notes (Signed)
Patient Name: Edward Strickland Date of Encounter: 07/11/2017  Primary Cardiologist: Dr. Tyrell Antonio Problem List     Principal Problem:   Acute on chronic systolic CHF (congestive heart failure) ([place]) Active Problems:   Aortic stenosis, severe   CKD (chronic kidney disease), stage III   Essential hypertension   COPD (chronic obstructive pulmonary disease) ([facility])   Pleural effusion     Subjective   No complaints. Says he is feeling much better and ready to get up walking around some.   Inpatient Medications    Scheduled Meds: . aspirin EC  81 mg Oral Daily  . enoxaparin (LOVENOX) injection  40 mg Subcutaneous Q24H  . furosemide  20 mg Intravenous Once  . furosemide  40 mg Intravenous BID  . metoprolol tartrate  25 mg Oral Daily  . potassium chloride  20 mEq Oral BID  . sodium chloride flush  3 mL Intravenous Q12H   Continuous Infusions: . sodium chloride     PRN Meds: sodium chloride, acetaminophen **OR** acetaminophen, ondansetron **OR** ondansetron (ZOFRAN) IV, sodium chloride flush   Vital Signs    Vitals:   07/10/17 1329 07/10/17 1500 07/10/17 2037 07/11/17 0534  BP: (!) 118/52 (!) 119/59 103/65 99/60  Pulse: 73 70 79 71  Resp: 16 18    Temp: 97.7 F (36.5 C) (!) 97.3 F (36.3 C) 98 F (36.7 C) 98.4 F (36.9 C)  TempSrc: Oral Oral Oral Oral  SpO2: 99% 100% 93% 95%  Weight:    151 lb 6.4 oz (68.7 kg)  Height:        Intake/Output Summary (Last 24 hours) at 07/11/17 0910 Last data filed at 07/11/17 0533  Gross per 24 hour  Intake              600 ml  Output             2625 ml  Net            -2025 ml   Filed Weights   07/09/17 0017 07/10/17 0531 07/11/17 0534  Weight: 150 lb 14.4 oz (68.4 kg) 152 lb 8 oz (69.2 kg) 151 lb 6.4 oz (68.7 kg)    Physical Exam   GEN: Well nourished, well developed, in no acute distress.  HEENT: Grossly normal.  Neck: Supple, no JVD, carotid upstrokes delayed with bilateral bruits. no masses. Cardiac: RRR, 3/6  harsh late peaking systolic murmur at RUSB/LLSB. No rubs or gallops. No clubbing, cyanosis. Trace pretibial edema with skin mottling  Radials/DP/PT 2+ and equal bilaterally.  Respiratory:  Respirations regular and unlabored, diminished breath sounds R> L, mild wheezing GI: Soft, nontender, nondistended, BS + x 4. MS: no deformity or atrophy. Skin: warm and dry, no rash. chronic stasis changes L>R Neuro:  Strength and sensation are intact. Psych: AAOx3.  Normal affect.  Labs    CBC  Recent Labs  07/09/17 0504  WBC 11.4*  HGB 12.7*  HCT 40.1  MCV 90.7  PLT 147   Basic Metabolic Panel  Recent Labs  07/10/17 0445 07/11/17 0443  NA 137 137  K 4.3 4.5  CL 96* 92*  CO2 35* 41*  GLUCOSE 119* 103*  BUN 28* 26*  CREATININE 1.11 1.23  CALCIUM 8.8* 9.2   Liver Function Tests No results for input(s): AST, ALT, ALKPHOS, BILITOT, PROT, ALBUMIN in the last 72 hours. No results for input(s): LIPASE, AMYLASE in the last 72 hours. Cardiac Enzymes No results for input(s): CKTOTAL, CKMB, CKMBINDEX, TROPONINI in  the last 72 hours. BNP Invalid input(s): POCBNP D-Dimer No results for input(s): DDIMER in the last 72 hours. Hemoglobin A1C No results for input(s): HGBA1C in the last 72 hours. Fasting Lipid Panel No results for input(s): CHOL, HDL, LDLCALC, TRIG, CHOLHDL, LDLDIRECT in the last 72 hours. Thyroid Function Tests No results for input(s): TSH, T4TOTAL, T3FREE, THYROIDAB in the last 72 hours.  Invalid input(s): FREET3  Telemetry    NSR - Personally Reviewed  ECG    sinus with 1st deg AV block, LVH with repol abnormality, HR 74 - Personally Reviewed  Radiology    Dg Orthopantogram  Result Date: 07/10/2017 CLINICAL DATA:  Aortic stenosis. EXAM: ORTHOPANTOGRAM/PANORAMIC COMPARISON:  None. FINDINGS: Mandible is intact. No fracture deformity. Multiple absent teeth. Approximate tooth 28 periapical lucency and dental carie. Tooth 27 fragment. IMPRESSION: No fracture  deformity. Multiple absent teeth. Approximate tooth 27 periapical lucency/ abscess and dental carie. Electronically Signed   By: Elon Alas M.D.   On: 07/10/2017 20:31   Dg Chest Port 1v Same Day  Addendum Date: 07/10/2017   ADDENDUM REPORT: 07/10/2017 20:33 ADDENDUM: This addendum is given for the purpose of noting the impression in the initially dictated report is under the findings. The impression should read as follows: Large right pleural effusion associated airspace disease which could be atelectasis or pneumonia. Cardiomegaly and interstitial pulmonary edema. Electronically Signed   By: Inge Rise M.D.   On: 07/10/2017 20:33   Result Date: 07/10/2017 CLINICAL DATA:  Shortness of breath.  Hypoxia. EXAM: PORTABLE CHEST 1 VIEW COMPARISON:  None. FINDINGS: There is a large right pleural effusion with associated airspace disease. Cardiomegaly and interstitial edema are identified. No pneumothorax. No left effusion. Large right pleural effusion and associated airspace disease which could be atelectasis or pneumonia. Cardiomegaly and interstitial pulmonary edema. IMPRESSION: No active disease. Electronically Signed: By: Inge Rise M.D. On: 07/09/2017 23:05    Cardiac Studies   2D ECHO: 07/09/2017 LV EF: 25% -   30% Study Conclusion - Left ventricle: The cavity size was mildly dilated. Wall   thickness was increased in a pattern of mild LVH. Systolic   function was severely reduced. The estimated ejection fraction   was in the range of 25% to 30%. Diffuse hypokinesis. - Aortic valve: Valve mobility was restricted. There was severe   stenosis. There was mild to moderate regurgitation. Valve area   (VTI): 0.41 cm^2. Valve area (Vmax): 0.35 cm^2. Valve area   (Vmean): 0.39 cm^2. - Left atrium: The atrium was moderately dilated. - Right ventricle: The cavity size was moderately dilated. Systolic   function was moderately reduced. - Right atrium: The atrium was moderately  dilated. - Pulmonary arteries: Systolic pressure was severely increased. PA   peak pressure: 79 mm Hg (S). - Pericardium, extracardiac: There was a left pleural effusion. Impressions: - Severe global reduction in LV systolic function; mild LVE and   LVH; calcified aortic valve with severe AS and mild to moderate   AI; moderate LAE; moderate RVE with moderately reduced function;   mild TR with severely elevated pulmonary pressure.   07/10/17 RIGHT HEART CATH AND CORONARY ANGIOGRAPHY  Conclusion  1. Severe single-vessel coronary artery disease with severe stenosis in the mid LAD and otherwise mild diffuse nonobstructive calcific coronary disease 2. Known severe aortic stenosis with very heavy calcification of the aortic valve on plain fluoroscopy and severely restricted aortic leaflet mobility 3. Severe pulmonary hypertension likely secondary to left heart disease with severely elevated pulmonary capillary  wedge pressure  Recommendation: Continued evaluation by the multidisciplinary heart valve team. Likely proceed with PCI of the LAD followed by TAVR pending cardiac surgical consultation     Patient Profile     Nahshon Reich is a 76 y.o. male with a hx of HTN, aortic stenosis, and COPD who was transferred to [facility] from [facility] for further work up of his severe AS and new systolic CHF.   Assessment & Plan    Severe symptomatic stage D AS: echo from [facility] showed severe aortic stenosis with peak gradient of 89 mmHg and mean transvalvular gradient of 51 mmHg. Repeat echo here shows the aortic valve is severely calcified and markedly restricted. There is at least mild aortic insufficiency. DVI 0.16. [facility] 9/12 showed single vessel LAD stenosis that will require revascularization. We have ordered CT angiography studies, PFTs, carotid dopplers, PT eval and cardiac surgical evaluation as a part of the pre TAVR work up.   New systolic CHF: EF 17-51% by echo. Likely LV dilation 2/2 severe AS.  CXR 9/11 showed large R pleural effusion and pulmonary vascular congestion. IV Lasix increased to 40mg  BID. Net neg 3.1L. Creat 1.11--> 1.24. Will stop IV lasix and switch to po lasix 40mg  BID.   CAD: cath yesterday showed 90% mLAD stenosis that will require revascularization. Plan for PCI of the LAD followed by TAVR pending cardiac surgical consultation. We will want to delay this until after dental extractions 9/17 given need for DAPT. Continue ASA 81mg  daily and Lopressor 12.5mg  BID. I will add atorvastatin 80mg  daily.   Right pleural effusion: likely 2/2 CHF/high RA pressures. We have placed an order for IR thoracentesis. This will hopefully help respiratory status as well.   AKI: improved with diuresis. Now creeping up again. Continue to follow   COPD: chart history of this. He did smoke remotely. Will get PFTs as a part of the pre TAVR work up.   HTN: BP well controlled today, we are holding home lisinopril while we diurese him and with exposure to IV contrast   Dental: the patient currently wishes to proceed with extraction of remaining teeth with alveoloplasty and pre-prosthetic surgery. Dr. Enrique Sack is tentatively planning for oral surgery on 9/17 in the OR  Dispo: he does not have a lot of social support and will likely need short term placement in a SNF at discharge.   Signed, Angelena Form, PA-C  07/11/2017, 9:10 AM   Patient seen, examined. Available data reviewed. Agree with findings, assessment, and plan as outlined by Nell Range, PA-C. On my exam the patient is comfortable, lying in bed. Lung fields demonstrate improved aeration compared to yesterday's exam. Heart is regular rate and rhythm with a grade 3/6 harsh late peaking systolic murmur with absent A2. Extremities show trace edema.  The patient did well today with thoracentesis which drained 2 L from the right pleural space. I have reviewed Dr. Cyndia Bent and Dr. Ritta Slot consultation notes. Plans for dental extraction  on Monday. CT angiography studies are scheduled tomorrow. Will plan for PCI of the LAD after his teeth are poor and he can be started on Plavix. All of this was discussed with the patient at length and he will ultimately be treated with TAVR for treatment of critical aortic stenosis in the setting of acute heart failure and severe LV dysfunction. Will transition him to oral furosemide today.  Sherren Mocha, M.D. 07/11/2017 7:14 PM

## 2017-07-12 ENCOUNTER — Inpatient Hospital Stay (HOSPITAL_COMMUNITY): Payer: Medicare Other

## 2017-07-12 ENCOUNTER — Encounter (HOSPITAL_COMMUNITY): Payer: Medicare Other

## 2017-07-12 DIAGNOSIS — I350 Nonrheumatic aortic (valve) stenosis: Secondary | ICD-10-CM

## 2017-07-12 LAB — BASIC METABOLIC PANEL
ANION GAP: 6 (ref 5–15)
BUN: 31 mg/dL — AB (ref 6–20)
CALCIUM: 8.8 mg/dL — AB (ref 8.9–10.3)
CO2: 34 mmol/L — AB (ref 22–32)
CREATININE: 1.31 mg/dL — AB (ref 0.61–1.24)
Chloride: 95 mmol/L — ABNORMAL LOW (ref 101–111)
GFR calc Af Amer: 59 mL/min — ABNORMAL LOW (ref >60)
GFR, EST NON AFRICAN AMERICAN: 51 mL/min — AB (ref >60)
GLUCOSE: 99 mg/dL (ref 65–99)
Potassium: 4.5 mmol/L (ref 3.5–5.1)
Sodium: 135 mmol/L (ref 135–145)

## 2017-07-12 LAB — TRIGLYCERIDES, BODY FLUIDS: TRIGLYCERIDES FL: 15 mg/dL

## 2017-07-12 MED ORDER — FUROSEMIDE 40 MG PO TABS
40.0000 mg | ORAL_TABLET | Freq: Two times a day (BID) | ORAL | Status: DC
  Administered 2017-07-13 – 2017-07-15 (×5): 40 mg via ORAL
  Filled 2017-07-12 (×5): qty 1

## 2017-07-12 MED ORDER — IOPAMIDOL (ISOVUE-370) INJECTION 76%
INTRAVENOUS | Status: AC
  Administered 2017-07-12: 100 mL via INTRAVENOUS
  Filled 2017-07-12: qty 100

## 2017-07-12 MED ORDER — FUROSEMIDE 40 MG PO TABS: 40.0000 mg | ORAL_TABLET | Freq: Two times a day (BID) | ORAL | Status: DC

## 2017-07-12 NOTE — Care Management Important Message (Signed)
Important Message  Patient Details  Name: Edward Strickland MRN: [id number] Date of Birth: 04-Oct-1941   Medicare Important Message Given:  Yes    [name] [name] 07/12/2017, 12:32 PM

## 2017-07-12 NOTE — Progress Notes (Signed)
Patient Name: Edward Strickland Date of Encounter: 07/12/2017  Primary Cardiologist: Dr. Tyrell Antonio Problem List     Principal Problem:   Acute on chronic systolic CHF (congestive heart failure) ([place]) Active Problems:   Aortic stenosis, severe   CKD (chronic kidney disease), stage III   Essential hypertension   COPD (chronic obstructive pulmonary disease) (HCC)   Pleural effusion     Subjective   No complaints. Breathing is a lot better.   Inpatient Medications    Scheduled Meds: . aspirin EC  81 mg Oral Daily  . atorvastatin  80 mg Oral q1800  . enoxaparin (LOVENOX) injection  40 mg Subcutaneous Q24H  . furosemide  20 mg Intravenous Once  . furosemide  40 mg Oral BID  . metoprolol tartrate  12.5 mg Oral BID  . potassium chloride  20 mEq Oral BID  . sodium chloride flush  3 mL Intravenous Q12H   Continuous Infusions: . sodium chloride     PRN Meds: sodium chloride, acetaminophen **OR** acetaminophen, lidocaine (PF), ondansetron **OR** ondansetron (ZOFRAN) IV, sodium chloride flush   Vital Signs    Vitals:   07/11/17 0534 07/11/17 1130 07/11/17 1940 07/12/17 0406  BP: 99/60 106/60 (!) 109/59 100/62  Pulse: 71 76 85 69  Resp:  18 18 18   Temp: 98.4 F (36.9 C) 98 F (36.7 C) 97.8 F (36.6 C) 97.7 F (36.5 C)  TempSrc: Oral Oral Oral Oral  SpO2: 95% 96% 95% 98%  Weight: 151 lb 6.4 oz (68.7 kg)   141 lb 11.2 oz (64.3 kg)  Height:        Intake/Output Summary (Last 24 hours) at 07/12/17 0854 Last data filed at 07/12/17 0824  Gross per 24 hour  Intake              480 ml  Output             1960 ml  Net            -1480 ml   Filed Weights   07/10/17 0531 07/11/17 0534 07/12/17 0406  Weight: 152 lb 8 oz (69.2 kg) 151 lb 6.4 oz (68.7 kg) 141 lb 11.2 oz (64.3 kg)    Physical Exam   GEN: Well nourished, well developed, in no acute distress.  HEENT: Grossly normal.  Neck: Supple, no JVD, carotid upstrokes delayed with bilateral bruits. no  masses. Cardiac: RRR, 3/6 harsh late peaking systolic murmur at RUSB/LLSB. No rubs or gallops. No clubbing, cyanosis. Trace pretibial edema with skin mottling  Radials/DP/PT 2+ and equal bilaterally.  Respiratory:  Respirations regular and unlabored, aeration improved since yesterday GI: Soft, nontender, nondistended, BS + x 4. MS: no deformity or atrophy. Skin: warm and dry, no rash. chronic stasis changes L>R Neuro:  Strength and sensation are intact. Psych: AAOx3.  Normal affect.  Labs    CBC No results for input(s): WBC, NEUTROABS, HGB, HCT, MCV, PLT in the last 72 hours. Basic Metabolic Panel  Recent Labs  07/11/17 0443 07/12/17 0507  NA 137 135  K 4.5 4.5  CL 92* 95*  CO2 41* 34*  GLUCOSE 103* 99  BUN 26* 31*  CREATININE 1.23 1.31*  CALCIUM 9.2 8.8*   Liver Function Tests No results for input(s): AST, ALT, ALKPHOS, BILITOT, PROT, ALBUMIN in the last 72 hours. No results for input(s): LIPASE, AMYLASE in the last 72 hours. Cardiac Enzymes No results for input(s): CKTOTAL, CKMB, CKMBINDEX, TROPONINI in the last 72 hours. BNP Invalid input(s):  POCBNP D-Dimer No results for input(s): DDIMER in the last 72 hours. Hemoglobin A1C No results for input(s): HGBA1C in the last 72 hours. Fasting Lipid Panel No results for input(s): CHOL, HDL, LDLCALC, TRIG, CHOLHDL, LDLDIRECT in the last 72 hours. Thyroid Function Tests No results for input(s): TSH, T4TOTAL, T3FREE, THYROIDAB in the last 72 hours.  Invalid input(s): FREET3  Telemetry    NSR - Personally Reviewed  ECG    sinus with 1st deg AV block, LVH with repol abnormality, HR 74 - Personally Reviewed  Radiology    Dg Orthopantogram  Result Date: 07/10/2017 CLINICAL DATA:  Aortic stenosis. EXAM: ORTHOPANTOGRAM/PANORAMIC COMPARISON:  None. FINDINGS: Mandible is intact. No fracture deformity. Multiple absent teeth. Approximate tooth 28 periapical lucency and dental carie. Tooth 27 fragment. IMPRESSION: No fracture  deformity. Multiple absent teeth. Approximate tooth 27 periapical lucency/ abscess and dental carie. Electronically Signed   By: Elon Alas M.D.   On: 07/10/2017 20:31   Dg Chest 1 View  Result Date: 07/11/2017 CLINICAL DATA:  76 year old male with a history of right pleural effusion EXAM: CHEST 1 VIEW COMPARISON:  07/09/2017 FINDINGS: Cardiomediastinal silhouette unchanged with cardiomegaly. Calcifications of the aortic arch. Status post thoracentesis there is improved right-sided opacity, with persisting blunting of the right costophrenic angle and partial obscuration the right hemidiaphragm. Partial obscuration of the left hemidiaphragm persists with blunting of the left costophrenic angle. Ill-defined airspace disease in the right mid lung. No pneumothorax. Coarsened interstitial markings. IMPRESSION: Reduced volume of right-sided pleural effusion status post thoracentesis with no pneumothorax. Trace left-sided pleural effusion persists with associated consolidation/atelectasis. Cardiomegaly.  Background of chronic lung changes. Electronically Signed   By: Corrie Mckusick D.O.   On: 07/11/2017 14:23   Ir Thoracentesis Asp Pleural Space W/img Guide  Result Date: 07/11/2017 INDICATION: Dyspnea. Hypoxia. Bilateral pleural effusion. Request for diagnostic and therapeutic thoracentesis. EXAM: ULTRASOUND GUIDED RIGHT THORACENTESIS MEDICATIONS: 1% Lidocaine = 10 mL COMPLICATIONS: None immediate. PROCEDURE: An ultrasound guided thoracentesis was thoroughly discussed with the patient and questions answered. The benefits, risks, alternatives and complications were also discussed. The patient understands and wishes to proceed with the procedure. Written consent was obtained. Ultrasound was performed to localize and mark an adequate pocket of fluid in the right chest. The area was then prepped and draped in the normal sterile fashion. 1% Lidocaine was used for local anesthesia. Under ultrasound guidance a 6 Fr  Safe-T-Centesis catheter was introduced. Thoracentesis was performed. The catheter was removed and a dressing applied. FINDINGS: A total of approximately 2 liters of clear orange fluid was removed. Samples were sent to the laboratory as requested by the clinical team. IMPRESSION: Successful ultrasound guided right thoracentesis yielding 2 liters of pleural fluid. Chest X-ray reveals no pneumothorax. Read by:  Gareth Eagle, PA-C Electronically Signed   By: Jacqulynn Cadet M.D.   On: 07/11/2017 14:42    Cardiac Studies   2D ECHO: 07/09/2017 LV EF: 25% -   30% Study Conclusion - Left ventricle: The cavity size was mildly dilated. Wall   thickness was increased in a pattern of mild LVH. Systolic   function was severely reduced. The estimated ejection fraction   was in the range of 25% to 30%. Diffuse hypokinesis. - Aortic valve: Valve mobility was restricted. There was severe   stenosis. There was mild to moderate regurgitation. Valve area   (VTI): 0.41 cm^2. Valve area (Vmax): 0.35 cm^2. Valve area   (Vmean): 0.39 cm^2. - Left atrium: The atrium was moderately dilated. -  Right ventricle: The cavity size was moderately dilated. Systolic   function was moderately reduced. - Right atrium: The atrium was moderately dilated. - Pulmonary arteries: Systolic pressure was severely increased. PA   peak pressure: 79 mm Hg (S). - Pericardium, extracardiac: There was a left pleural effusion. Impressions: - Severe global reduction in LV systolic function; mild LVE and   LVH; calcified aortic valve with severe AS and mild to moderate   AI; moderate LAE; moderate RVE with moderately reduced function;   mild TR with severely elevated pulmonary pressure.   07/10/17 RIGHT HEART CATH AND CORONARY ANGIOGRAPHY  Conclusion  1. Severe single-vessel coronary artery disease with severe stenosis in the mid LAD and otherwise mild diffuse nonobstructive calcific coronary disease 2. Known severe aortic stenosis  with very heavy calcification of the aortic valve on plain fluoroscopy and severely restricted aortic leaflet mobility 3. Severe pulmonary hypertension likely secondary to left heart disease with severely elevated pulmonary capillary wedge pressure  Recommendation: Continued evaluation by the multidisciplinary heart valve team. Likely proceed with PCI of the LAD followed by TAVR pending cardiac surgical consultation     Patient Profile     Edward Strickland is a 76 y.o. male with a hx of HTN, aortic stenosis, and COPD who was transferred to [facility] from [place] for further work up of his severe AS and new systolic CHF.   Assessment & Plan    Severe symptomatic stage D AS: echo from [facility] showed severe aortic stenosis with peak gradient of 89 mmHg and mean transvalvular gradient of 51 mmHg. Repeat echo here shows the aortic valve is severely calcified and markedly restricted. There is at least mild aortic insufficiency. DVI 0.16. [facility] 9/12 showed single vessel LAD stenosis that will require revascularization. We have ordered CT angiography studies, PFTs, carotid dopplers, PT eval and cardiac surgical evaluation as a part of the pre TAVR work up.   New systolic CHF: EF 16-10% by echo. Likely LV dilation 2/2 severe AS. CXR 9/11 showed large R pleural effusion and pulmonary vascular congestion. IV Lasix increased to 40mg  BID. Net neg 4.6L. He appears euvolemic now and creat bumped. Will hold evening dose of lasix tonight since he will get CT scans today.  CAD: cath 9/12 showed 90% mLAD stenosis that will require revascularization. Plan for PCI of the LAD followed by TAVR. We will want to delay this until after dental extractions 9/17 given need for DAPT. Continue ASA 81mg  daily, Lopressor 12.5mg  BID, and atorvastatin 80mg  daily.   Right pleural effusion: likely 2/2 CHF/high RA pressures. s/p IR thoracentesis yesterday which drained 2 L from the right pleural space. Appears transudative, but need CMET to  compare with serum levels  AKI: creat now up to 1.31. His IV lasix was switched to PO lasix 40mg  BID yesterday. He will get CT scans today with contrast. So I will hold evening dose and restart tomorrow.   COPD: severe by PFTs  HTN: BP well controlled today, we are holding home lisinopril while we diurese him and with exposure to IV contrast   Dental: the patient currently wishes to proceed with extraction of remaining teeth with alveoloplasty and pre-prosthetic surgery. Dr. Enrique Sack is tentatively planning for oral surgery on 9/17 in the OR  Dispo: he does not have a lot of social support and will likely need short term placement in a SNF at discharge.   Signed, Angelena Form, PA-C  07/12/2017, 8:54 AM   Patient seen, examined. Available data reviewed. Agree with  findings, assessment, and plan as outlined by Nell Range, PA-C. Exam reveals his breathing to be much more comfortable today. Lungs with improved aeration, CV: RRR with 3/6 harsh late peaking systolic murmur with absent A2. Extremities without edema. CTA studies to be done today, dental extraction Monday, PCI next week after dental extraction when he can be started on plavix. Plan reviewed with patient who understands. Potentially proceed with TAVR late next week pending schedule.  I agree with holding lasix until BMET results tomorrow especially consider CT scans to be done today.  Sherren Mocha, M.D. 07/12/2017 12:24 PM

## 2017-07-12 NOTE — Progress Notes (Signed)
Physical Therapy Treatment Patient Details Name: Edward Strickland MRN: [id number] DOB: January 25, 1941 Today's Date: 07/12/2017    History of Present Illness Pt is a 76 yo male who presented to ED with SOB and LE swelling, recently diagnosed with critical aortic stenosis, PMH for HTN, CKD, and CHF.     PT Comments    Pt is making good progress towards his goals today. Pt currently min guard for bed mobility, transfers and ambulation of 500 with RW. Pt requires skilled PT to progress mobility and improve strength and endurance to safely mobilize in his home environment at discharge.   Follow Up Recommendations  Home health PT;Supervision - Intermittent     Equipment Recommendations  None recommended by PT       Precautions / Restrictions Precautions Precautions: Fall Restrictions Weight Bearing Restrictions: No    Mobility  Bed Mobility Overal bed mobility: Needs Assistance Bed Mobility: Supine to Sit     Supine to sit: Min guard     General bed mobility comments: min guard for safety, vc for hand placement  Transfers Overall transfer level: Needs assistance Equipment used: None Transfers: Sit to/from Stand Sit to Stand: Min guard         General transfer comment: min guard for safety, pt steady for self use of urinal    Ambulation/Gait Ambulation/Gait assistance: Min guard Ambulation Distance (Feet): 500 Feet Assistive device: Rolling walker (2 wheeled);Straight cane Gait Pattern/deviations: Step-through pattern;Trunk flexed;Decreased step length - right;Decreased step length - left Gait velocity: slowed Gait velocity interpretation: Below normal speed for age/gender General Gait Details: min guard for safety, vc for RW managment around obstacles         Balance Overall balance assessment: Needs assistance Sitting-balance support: No upper extremity supported;Feet supported Sitting balance-Leahy Scale: Good     Standing balance support: No upper extremity  supported;During functional activity Standing balance-Leahy Scale: Fair                              Cognition Arousal/Alertness: Awake/alert Behavior During Therapy: WFL for tasks assessed/performed Overall Cognitive Status: No family/caregiver present to determine baseline cognitive functioning                                 General Comments: sometimes have to repeat or rephrase questions for understanding         General Comments General comments (skin integrity, edema, etc.): Pt on 3L O2 via nasal cannula, at rest SaO2 96%O2 HR 66bpm, with ambulation SaO2 dropped to 90%O2 with max HR 106bpm      Pertinent Vitals/Pain Pain Assessment: No/denies pain    Home Living Family/patient expects to be discharged to:: Private residence Living Arrangements: Alone Available Help at Discharge: Neighbor;Available PRN/intermittently Type of Home: House Home Access: Level entry   Home Layout: One level Home Equipment: Cane - single point;Walker - 2 wheels      Prior Function Level of Independence: Needs assistance  Gait / Transfers Assistance Needed: walks with cane or RW as appropriate ADL's / Homemaking Assistance Needed: drives to restaurant or eats at neighbors house, able to bath and dress but showers at neighbors house Comments: relies on neighbor [name] for iADLs independent with ADLs   PT Goals (current goals can now be found in the care plan section) Acute Rehab PT Goals Patient Stated Goal: go home PT Goal Formulation: With patient Time  For Goal Achievement: 07/25/17 Potential to Achieve Goals: Good    Frequency    Min 3X/week      PT Plan Current plan remains appropriate       AM-PAC PT "6 Clicks" Daily Activity  Outcome Measure  Difficulty turning over in bed (including adjusting bedclothes, sheets and blankets)?: A Little Difficulty moving from lying on back to sitting on the side of the bed? : A Little Difficulty sitting down  on and standing up from a chair with arms (e.g., wheelchair, bedside commode, etc,.)?: None Help needed moving to and from a bed to chair (including a wheelchair)?: None Help needed walking in hospital room?: None Help needed climbing 3-5 steps with a railing? : A Little 6 Click Score: 21    End of Session Equipment Utilized During Treatment: Gait belt;Oxygen Activity Tolerance: Patient tolerated treatment well Patient left: in chair;with call bell/phone within reach;with chair alarm set Nurse Communication: Mobility status PT Visit Diagnosis: Other abnormalities of gait and mobility (R26.89);Muscle weakness (generalized) (M62.81);Difficulty in walking, not elsewhere classified (R26.2)     Time: 8250-0370 PT Time Calculation (min) (ACUTE ONLY): 23 min  Charges:  $Gait Training: 23-37 mins                    G Codes:       Venora Kautzman B. Migdalia Dk PT, DPT Acute Rehabilitation  [phone] Pager [phone]     [place] 07/12/2017, 2:35 PM

## 2017-07-13 ENCOUNTER — Inpatient Hospital Stay (HOSPITAL_COMMUNITY): Payer: Medicare Other

## 2017-07-13 DIAGNOSIS — Z01810 Encounter for preprocedural cardiovascular examination: Secondary | ICD-10-CM

## 2017-07-13 LAB — PH, BODY FLUID: pH, Body Fluid: 7.9

## 2017-07-13 LAB — BASIC METABOLIC PANEL
ANION GAP: 10 (ref 5–15)
BUN: 28 mg/dL — AB (ref 6–20)
CHLORIDE: 96 mmol/L — AB (ref 101–111)
CO2: 30 mmol/L (ref 22–32)
Calcium: 8.4 mg/dL — ABNORMAL LOW (ref 8.9–10.3)
Creatinine, Ser: 1.09 mg/dL (ref 0.61–1.24)
GFR calc Af Amer: >60 mL/min (ref >60)
Glucose, Bld: 144 mg/dL — ABNORMAL HIGH (ref 65–99)
POTASSIUM: 4.5 mmol/L (ref 3.5–5.1)
SODIUM: 136 mmol/L (ref 135–145)

## 2017-07-13 NOTE — Progress Notes (Signed)
Progress Note  Patient Name: Edward Strickland Date of Encounter: 07/13/2017   Subjective  No complaints this AM  Inpatient Medications    Scheduled Meds: . aspirin EC  81 mg Oral Daily  . atorvastatin  80 mg Oral q1800  . enoxaparin (LOVENOX) injection  40 mg Subcutaneous Q24H  . furosemide  20 mg Intravenous Once  . furosemide  40 mg Oral BID  . metoprolol tartrate  12.5 mg Oral BID  . potassium chloride  20 mEq Oral BID  . sodium chloride flush  3 mL Intravenous Q12H   Continuous Infusions: . sodium chloride     PRN Meds: sodium chloride, acetaminophen **OR** acetaminophen, lidocaine (PF), ondansetron **OR** ondansetron (ZOFRAN) IV, sodium chloride flush   Vital Signs    Vitals:   07/13/17 0009 07/13/17 0500 07/13/17 0930 07/13/17 1133  BP: (!) 97/52 96/63 100/60 (!) 82/53  Pulse: 72 78 78 70  Resp: 18 18  18   Temp:  98.1 F (36.7 C)  98.2 F (36.8 C)  TempSrc:  Oral  Oral  SpO2: 95% 95%  95%  Weight:  143 lb 3.2 oz (65 kg)    Height:        Intake/Output Summary (Last 24 hours) at 07/13/17 1411 Last data filed at 07/13/17 0900  Gross per 24 hour  Intake             1080 ml  Output              850 ml  Net              230 ml   Filed Weights   07/11/17 0534 07/12/17 0406 07/13/17 0500  Weight: 151 lb 6.4 oz (68.7 kg) 141 lb 11.2 oz (64.3 kg) 143 lb 3.2 oz (65 kg)    Telemetry    SR - Personally Reviewed  ECG     Physical Exam   GEN: No acute distress.   Neck: No JVD Cardiac: RRR, 3/6 sysotlic murmur rusb, rubs, or gallops.  Respiratory: Clear to auscultation bilaterally. GI: Soft, nontender, non-distended  MS: No edema; No deformity. Neuro:  Nonfocal  Psych: Normal affect   Labs    Chemistry Recent Labs Lab 07/10/17 0445 07/11/17 0443 07/12/17 0507  NA 137 137 135  K 4.3 4.5 4.5  CL 96* 92* 95*  CO2 35* 41* 34*  GLUCOSE 119* 103* 99  BUN 28* 26* 31*  CREATININE 1.11 1.23 1.31*  CALCIUM 8.8* 9.2 8.8*  GFRNONAA >60 55* 51*    GFRAA >60 >60 59*  ANIONGAP 6 4* 6     Hematology Recent Labs Lab 07/09/17 0504  WBC 11.4*  RBC 4.42  HGB 12.7*  HCT 40.1  MCV 90.7  MCH 28.7  MCHC 31.7  RDW 14.9  PLT 234    Cardiac EnzymesNo results for input(s): TROPONINI in the last 168 hours. No results for input(s): TROPIPOC in the last 168 hours.   BNPNo results for input(s): BNP, PROBNP in the last 168 hours.   DDimer No results for input(s): DDIMER in the last 168 hours.   Radiology    Dg Chest 1 View  Result Date: 07/11/2017 CLINICAL DATA:  76 year old male with a history of right pleural effusion EXAM: CHEST 1 VIEW COMPARISON:  07/09/2017 FINDINGS: Cardiomediastinal silhouette unchanged with cardiomegaly. Calcifications of the aortic arch. Status post thoracentesis there is improved right-sided opacity, with persisting blunting of the right costophrenic angle and partial obscuration the right hemidiaphragm. Partial obscuration of  the left hemidiaphragm persists with blunting of the left costophrenic angle. Ill-defined airspace disease in the right mid lung. No pneumothorax. Coarsened interstitial markings. IMPRESSION: Reduced volume of right-sided pleural effusion status post thoracentesis with no pneumothorax. Trace left-sided pleural effusion persists with associated consolidation/atelectasis. Cardiomegaly.  Background of chronic lung changes. Electronically Signed   By: Corrie Mckusick D.O.   On: 07/11/2017 14:23   Ct Coronary Morph W/cta Cor W/score W/ca W/cm &/or Wo/cm  Result Date: 07/12/2017 CLINICAL DATA:  76 year old male with severe aortic stenosis being evaluated for a TAVR procedure. EXAM: Cardiac TAVR CT TECHNIQUE: The patient was scanned on a [employer]. A 120 kV retrospective scan was triggered in the descending thoracic aorta at 111 HU's. Gantry rotation speed was 250 msecs and collimation was .6 mm. No beta blockade or nitro were given. The 3D data set was reconstructed in 5% intervals of the  R-R cycle. Systolic and diastolic phases were analyzed on a dedicated work station using MPR, MIP and VRT modes. The patient received 80 cc of contrast. FINDINGS: Aortic Valve: Trileaflet aortic valve with heavily calcified leaflets and severe leaflet opening restriction. There are significant asymmetric calcifications extending into the LVOT under the non-coronary leaflet. Aorta:  Normal size, mild diffuse calcifications, no dissection. Sinotubular Junction:  31 x 28 mm Ascending Thoracic Aorta:  36 x 35 mm Aortic Arch:  24 x 24 mm Descending Thoracic Aorta:  24 x 24 mm Sinus of Valsalva Measurements: Non-coronary:  35 mm Right -coronary:  33 mm Left -coronary:  34 mm Coronary Artery Height above Annulus: Left Main:  17 mm Right Coronary:  18 mm Virtual Basal Annulus Measurements: Maximum/Minimum Diameter:  31.6 x 23.4 mm Perimeter:  89.2 mm Area:  601 mm2 Coronary Arteries:  Normal origin. Optimum Fluoroscopic Angle for Delivery:  LAU 7 CAU 7 IMPRESSION: 1. Trileaflet aortic valve with heavily calcified leaflets and severe leaflet opening restriction. There are significant asymmetric calcifications extending into the LVOT under the non-coronary leaflet. Annular measurements suitable for delivery of a 29 mm Edwards-SAPIEN valve. 2. Sufficient annulus to coronary distance. 3. Optimum Fluoroscopic Angle for Delivery:  LAU 7 CAU 7. 4. No Thrombus in the left atrial appendage. 5. Dilated pulmonary artery measuring 33 x 32 mm suggestive of pulmonary hypertension. Ena Dawley Electronically Signed   By: Ena Dawley   On: 07/12/2017 18:17   Ir Thoracentesis Asp Pleural Space W/img Guide  Result Date: 07/11/2017 INDICATION: Dyspnea. Hypoxia. Bilateral pleural effusion. Request for diagnostic and therapeutic thoracentesis. EXAM: ULTRASOUND GUIDED RIGHT THORACENTESIS MEDICATIONS: 1% Lidocaine = 10 mL COMPLICATIONS: None immediate. PROCEDURE: An ultrasound guided thoracentesis was thoroughly discussed with the  patient and questions answered. The benefits, risks, alternatives and complications were also discussed. The patient understands and wishes to proceed with the procedure. Written consent was obtained. Ultrasound was performed to localize and mark an adequate pocket of fluid in the right chest. The area was then prepped and draped in the normal sterile fashion. 1% Lidocaine was used for local anesthesia. Under ultrasound guidance a 6 Fr Safe-T-Centesis catheter was introduced. Thoracentesis was performed. The catheter was removed and a dressing applied. FINDINGS: A total of approximately 2 liters of clear orange fluid was removed. Samples were sent to the laboratory as requested by the clinical team. IMPRESSION: Successful ultrasound guided right thoracentesis yielding 2 liters of pleural fluid. Chest X-ray reveals no pneumothorax. Read by:  Gareth Eagle, PA-C Electronically Signed   By: Jacqulynn Cadet M.D.   On:  07/11/2017 14:42    Cardiac Studies    Patient Profile     Edward Cooperis a 76 y.o.malewith a hx of HTN, aortic stenosis, and COPD who was transferred to [facility] from [place] for further work up of his severe AS and new systolic CHF.   Assessment & Plan    1. Aortic stenosis - severe AS, being evaluated for TAVR by Dr Burt Knack   2. Acute systolic HF - LVEF 69-79%, thought secondary to severe AS - negative 1 liters yesterday, negative 5 L since admisison. Uptrend in Cr, no labs back today. Lasix held yesterday evening due to uptrend in Cr and upcoming contrast for CT scans for TAVR workup - f/u kidney function,patient started on oral diuretics today - medical therapy with lopressor 12.5mg  bid. FOllow renal function, consider ACE-I.   3. CAD - 90% mid LAD on cath, plan for PCI per Dr Burt Knack perhaps next week  For questions or updates, please contact [facility] Please consult [email or web address] for contact info under Cardiology/STEMI.      Merrily Pew, MD  07/13/2017,  2:11 PM

## 2017-07-13 NOTE — Progress Notes (Signed)
VASCULAR LAB PRELIMINARY  PRELIMINARY  PRELIMINARY  PRELIMINARY  Carotid duplex completed.    Preliminary report:  1-39% ICA plaquing.  Vertebral artery flow is antegrade.   Selden Noteboom, RVT 07/13/2017, 10:53 AM

## 2017-07-13 NOTE — Progress Notes (Signed)
Pt stable at 7am to 3pm shift, vitals stable, no any complain of CP and SOB, handover given at 3pm to continuity of care.

## 2017-07-14 LAB — MAGNESIUM: MAGNESIUM: 2.3 mg/dL (ref 1.7–2.4)

## 2017-07-14 LAB — VAS US CAROTID
LCCADDIAS: 7 cm/s
LCCADSYS: 77 cm/s
LCCAPDIAS: 6 cm/s
LEFT ECA DIAS: -1 cm/s
LEFT VERTEBRAL DIAS: -6 cm/s
LICADDIAS: -22 cm/s
LICADSYS: -147 cm/s
LICAPDIAS: 15 cm/s
Left CCA prox sys: 68 cm/s
Left ICA prox sys: 85 cm/s
RCCAPDIAS: 1 cm/s
RCCAPSYS: 74 cm/s
RIGHT ECA DIAS: -1 cm/s
RIGHT VERTEBRAL DIAS: 2 cm/s
Right cca dist sys: -68 cm/s

## 2017-07-14 LAB — BASIC METABOLIC PANEL
Anion gap: 6 (ref 5–15)
BUN: 26 mg/dL — AB (ref 6–20)
CHLORIDE: 101 mmol/L (ref 101–111)
CO2: 31 mmol/L (ref 22–32)
CREATININE: 1.13 mg/dL (ref 0.61–1.24)
Calcium: 8.8 mg/dL — ABNORMAL LOW (ref 8.9–10.3)
GFR calc Af Amer: >60 mL/min (ref >60)
GFR calc non Af Amer: >60 mL/min (ref >60)
GLUCOSE: 93 mg/dL (ref 65–99)
POTASSIUM: 4.5 mmol/L (ref 3.5–5.1)
SODIUM: 138 mmol/L (ref 135–145)

## 2017-07-14 MED ORDER — ENOXAPARIN SODIUM 40 MG/0.4ML ~~LOC~~ SOLN: 40.0000 mg | SUBCUTANEOUS | Status: DC

## 2017-07-14 MED ORDER — CEFAZOLIN SODIUM-DEXTROSE 2-4 GM/100ML-% IV SOLN
2.0000 g | INTRAVENOUS | Status: AC
  Administered 2017-07-15: 2 g via INTRAVENOUS
  Filled 2017-07-14 (×2): qty 100

## 2017-07-14 NOTE — Progress Notes (Signed)
Pt is aware of dental surgery tomorrow am, consent has been taken, pt is going to be in NPO since 12 midnight, will continue to monitor

## 2017-07-14 NOTE — Progress Notes (Signed)
Renal function remains stable, continue oral lasix. TAVR team to resume rounds on Monday, no additional recs today. Call with questions today. Soft bp's at times, given his LVEF reasonable numbers. Continue low dose lopressor and lasix, if neccesary can hold meds   Zandra Abts MD

## 2017-07-15 ENCOUNTER — Inpatient Hospital Stay (HOSPITAL_COMMUNITY): Payer: Medicare Other | Admitting: Certified Registered Nurse Anesthetist

## 2017-07-15 ENCOUNTER — Encounter (HOSPITAL_COMMUNITY)
Admission: AD | Disposition: A | Discharge status: Skilled Nursing Facility | Payer: Self-pay | Source: Other Acute Inpatient Hospital | Attending: Cardiovascular Disease

## 2017-07-15 ENCOUNTER — Encounter (HOSPITAL_COMMUNITY): Payer: Self-pay | Admitting: Certified Registered Nurse Anesthetist

## 2017-07-15 DIAGNOSIS — I2510 Atherosclerotic heart disease of native coronary artery without angina pectoris: Secondary | ICD-10-CM

## 2017-07-15 DIAGNOSIS — K083 Retained dental root: Secondary | ICD-10-CM

## 2017-07-15 DIAGNOSIS — K0889 Other specified disorders of teeth and supporting structures: Secondary | ICD-10-CM | POA: Diagnosis present

## 2017-07-15 DIAGNOSIS — K0530 Chronic periodontitis, unspecified: Secondary | ICD-10-CM | POA: Diagnosis present

## 2017-07-15 DIAGNOSIS — K045 Chronic apical periodontitis: Secondary | ICD-10-CM | POA: Diagnosis present

## 2017-07-15 DIAGNOSIS — Z01818 Encounter for other preprocedural examination: Secondary | ICD-10-CM

## 2017-07-15 DIAGNOSIS — K029 Dental caries, unspecified: Secondary | ICD-10-CM

## 2017-07-15 DIAGNOSIS — I350 Nonrheumatic aortic (valve) stenosis: Secondary | ICD-10-CM

## 2017-07-15 HISTORY — PX: MULTIPLE EXTRACTIONS WITH ALVEOLOPLASTY: SHX5342

## 2017-07-15 LAB — SURGICAL PCR SCREEN
MRSA, PCR: NEGATIVE
Staphylococcus aureus: NEGATIVE

## 2017-07-15 LAB — BASIC METABOLIC PANEL
ANION GAP: 10 (ref 5–15)
BUN: 27 mg/dL — ABNORMAL HIGH (ref 6–20)
CALCIUM: 8.8 mg/dL — AB (ref 8.9–10.3)
CO2: 27 mmol/L (ref 22–32)
Chloride: 98 mmol/L — ABNORMAL LOW (ref 101–111)
Creatinine, Ser: 1.13 mg/dL (ref 0.61–1.24)
GLUCOSE: 99 mg/dL (ref 65–99)
POTASSIUM: 4.5 mmol/L (ref 3.5–5.1)
Sodium: 135 mmol/L (ref 135–145)

## 2017-07-15 SURGERY — MULTIPLE EXTRACTION WITH ALVEOLOPLASTY
Anesthesia: General | Site: Mouth

## 2017-07-15 MED ORDER — ROCURONIUM BROMIDE 100 MG/10ML IV SOLN
INTRAVENOUS | Status: DC | PRN
  Administered 2017-07-15: 50 mg via INTRAVENOUS

## 2017-07-15 MED ORDER — BACITRACIN ZINC 500 UNIT/GM EX OINT
1.0000 "application " | TOPICAL_OINTMENT | Freq: Three times a day (TID) | CUTANEOUS | Status: DC
  Administered 2017-07-15 – 2017-07-26 (×25): 1 via TOPICAL
  Filled 2017-07-15 (×6): qty 28.35

## 2017-07-15 MED ORDER — FENTANYL CITRATE (PF) 100 MCG/2ML IJ SOLN
INTRAMUSCULAR | Status: DC | PRN
  Administered 2017-07-15: 50 ug via INTRAVENOUS

## 2017-07-15 MED ORDER — BUPIVACAINE-EPINEPHRINE (PF) 0.5% -1:200000 IJ SOLN
INTRAMUSCULAR | Status: AC
  Filled 2017-07-15: qty 3.6

## 2017-07-15 MED ORDER — OXYMETAZOLINE HCL 0.05 % NA SOLN
NASAL | Status: AC
  Filled 2017-07-15: qty 15

## 2017-07-15 MED ORDER — OXYCODONE-ACETAMINOPHEN 5-325 MG PO TABS: 1.0000 | ORAL_TABLET | Freq: Four times a day (QID) | ORAL | Status: DC | PRN

## 2017-07-15 MED ORDER — LIDOCAINE-EPINEPHRINE 2 %-1:100000 IJ SOLN
INTRAMUSCULAR | Status: DC | PRN
  Administered 2017-07-15 (×6): 1.8 mL

## 2017-07-15 MED ORDER — AMINOCAPROIC ACID SOLUTION 5% (50 MG/ML)
5.0000 mL | ORAL | Status: AC
  Administered 2017-07-15 – 2017-07-18 (×9): 5 mL via ORAL
  Filled 2017-07-15 (×2): qty 100

## 2017-07-15 MED ORDER — FENTANYL CITRATE (PF) 100 MCG/2ML IJ SOLN: 25.0000 ug | INTRAMUSCULAR | Status: DC | PRN

## 2017-07-15 MED ORDER — SUGAMMADEX SODIUM 200 MG/2ML IV SOLN
INTRAVENOUS | Status: AC
  Filled 2017-07-15: qty 2

## 2017-07-15 MED ORDER — ROCURONIUM BROMIDE 10 MG/ML (PF) SYRINGE
PREFILLED_SYRINGE | INTRAVENOUS | Status: AC
  Filled 2017-07-15: qty 5

## 2017-07-15 MED ORDER — LACTATED RINGERS IV SOLN
Freq: Once | INTRAVENOUS | Status: AC
  Administered 2017-07-15: 10:00:00 via INTRAVENOUS

## 2017-07-15 MED ORDER — LIDOCAINE HCL (CARDIAC) 20 MG/ML IV SOLN
INTRAVENOUS | Status: DC | PRN
  Administered 2017-07-15: 100 mg via INTRAVENOUS

## 2017-07-15 MED ORDER — LACTATED RINGERS IV SOLN
INTRAVENOUS | Status: DC
  Administered 2017-07-15: 15:00:00 via INTRAVENOUS

## 2017-07-15 MED ORDER — OXYMETAZOLINE HCL 0.05 % NA SOLN
NASAL | Status: DC | PRN
  Administered 2017-07-15: 1 via TOPICAL

## 2017-07-15 MED ORDER — FENTANYL CITRATE (PF) 250 MCG/5ML IJ SOLN
INTRAMUSCULAR | Status: AC
  Filled 2017-07-15: qty 5

## 2017-07-15 MED ORDER — MEPERIDINE HCL 25 MG/ML IJ SOLN: 6.2500 mg | INTRAMUSCULAR | Status: DC | PRN

## 2017-07-15 MED ORDER — SUGAMMADEX SODIUM 200 MG/2ML IV SOLN
INTRAVENOUS | Status: DC | PRN
  Administered 2017-07-15: 125 mg via INTRAVENOUS

## 2017-07-15 MED ORDER — LIDOCAINE 2% (20 MG/ML) 5 ML SYRINGE
INTRAMUSCULAR | Status: AC
  Filled 2017-07-15: qty 5

## 2017-07-15 MED ORDER — LACTATED RINGERS IV SOLN
INTRAVENOUS | Status: DC | PRN
  Administered 2017-07-15: 10:00:00 via INTRAVENOUS

## 2017-07-15 MED ORDER — AMINOCAPROIC ACID SOLUTION 5% (50 MG/ML)
10.0000 mL | ORAL | Status: DC | PRN
  Administered 2017-07-20 (×4): 10 mL via ORAL
  Filled 2017-07-15 (×3): qty 100

## 2017-07-15 MED ORDER — OXYMETAZOLINE HCL 0.05 % NA SOLN
NASAL | Status: DC | PRN
  Administered 2017-07-15 (×3): 2 via NASAL

## 2017-07-15 MED ORDER — ONDANSETRON HCL 4 MG/2ML IJ SOLN
INTRAMUSCULAR | Status: AC
  Filled 2017-07-15: qty 2

## 2017-07-15 MED ORDER — BUPIVACAINE-EPINEPHRINE 0.5% -1:200000 IJ SOLN
INTRAMUSCULAR | Status: DC | PRN
  Administered 2017-07-15 (×2): 1.8 mL

## 2017-07-15 MED ORDER — 0.9 % SODIUM CHLORIDE (POUR BTL) OPTIME
TOPICAL | Status: DC | PRN
Start: 2017-07-15 — End: 2017-07-15
  Administered 2017-07-15: 1000 mL

## 2017-07-15 MED ORDER — PROPOFOL 10 MG/ML IV BOLUS
INTRAVENOUS | Status: AC
  Filled 2017-07-15: qty 20

## 2017-07-15 MED ORDER — PHENYLEPHRINE HCL 10 MG/ML IJ SOLN
INTRAVENOUS | Status: DC | PRN
  Administered 2017-07-15: 50 ug/min via INTRAVENOUS

## 2017-07-15 MED ORDER — ONDANSETRON HCL 4 MG/2ML IJ SOLN: 4.0000 mg | Freq: Once | INTRAMUSCULAR | Status: DC | PRN

## 2017-07-15 MED ORDER — ONDANSETRON HCL 4 MG/2ML IJ SOLN
INTRAMUSCULAR | Status: DC | PRN
  Administered 2017-07-15: 4 mg via INTRAVENOUS

## 2017-07-15 MED ORDER — DEXAMETHASONE SODIUM PHOSPHATE 10 MG/ML IJ SOLN
INTRAMUSCULAR | Status: DC | PRN
  Administered 2017-07-15: 10 mg via INTRAVENOUS

## 2017-07-15 MED ORDER — DEXAMETHASONE SODIUM PHOSPHATE 10 MG/ML IJ SOLN
INTRAMUSCULAR | Status: AC
  Filled 2017-07-15: qty 1

## 2017-07-15 MED ORDER — LIDOCAINE-EPINEPHRINE 2 %-1:100000 IJ SOLN
INTRAMUSCULAR | Status: AC
  Filled 2017-07-15: qty 10.2

## 2017-07-15 MED ORDER — PROPOFOL 10 MG/ML IV BOLUS
INTRAVENOUS | Status: DC | PRN
  Administered 2017-07-15: 100 mg via INTRAVENOUS

## 2017-07-15 SURGICAL SUPPLY — 39 items
ALCOHOL 70% 16 OZ (MISCELLANEOUS) ×3 IMPLANT
ATTRACTOMAT 16X20 MAGNETIC DRP (DRAPES) ×3 IMPLANT
BLADE SURG 15 STRL LF DISP TIS (BLADE) ×2 IMPLANT
BLADE SURG 15 STRL SS (BLADE) ×4
COVER SURGICAL LIGHT HANDLE (MISCELLANEOUS) IMPLANT
GAUZE PACKING FOLDED 2  STR (GAUZE/BANDAGES/DRESSINGS) ×2
GAUZE PACKING FOLDED 2 STR (GAUZE/BANDAGES/DRESSINGS) ×1 IMPLANT
GAUZE SPONGE 4X4 12PLY STRL (GAUZE/BANDAGES/DRESSINGS) ×3 IMPLANT
GAUZE SPONGE 4X4 16PLY XRAY LF (GAUZE/BANDAGES/DRESSINGS) ×3 IMPLANT
GLOVE BIOGEL PI IND STRL 6 (GLOVE) ×1 IMPLANT
GLOVE BIOGEL PI INDICATOR 6 (GLOVE) ×2
GLOVE SURG ORTHO 8.0 STRL STRW (GLOVE) ×3 IMPLANT
GLOVE SURG SS PI 6.0 STRL IVOR (GLOVE) ×3 IMPLANT
GOWN STRL REUS W/ TWL LRG LVL3 (GOWN DISPOSABLE) ×1 IMPLANT
GOWN STRL REUS W/TWL 2XL LVL3 (GOWN DISPOSABLE) ×3 IMPLANT
GOWN STRL REUS W/TWL LRG LVL3 (GOWN DISPOSABLE) ×2
HEMOSTAT SURGICEL 2X14 (HEMOSTASIS) IMPLANT
KIT BASIN OR (CUSTOM PROCEDURE TRAY) ×3 IMPLANT
KIT ROOM TURNOVER OR (KITS) ×3 IMPLANT
MANIFOLD NEPTUNE WASTE (CANNULA) ×3 IMPLANT
NEEDLE BLUNT 16X1.5 OR ONLY (NEEDLE) ×3 IMPLANT
NEEDLE DENTAL 27 LONG (NEEDLE) ×6 IMPLANT
NS IRRIG 1000ML POUR BTL (IV SOLUTION) ×6 IMPLANT
PACK EENT II TURBAN DRAPE (CUSTOM PROCEDURE TRAY) ×3 IMPLANT
PAD ARMBOARD 7.5X6 YLW CONV (MISCELLANEOUS) ×9 IMPLANT
SPONGE SURGIFOAM ABS GEL 100 (HEMOSTASIS) IMPLANT
SPONGE SURGIFOAM ABS GEL 12-7 (HEMOSTASIS) IMPLANT
SPONGE SURGIFOAM ABS GEL SZ50 (HEMOSTASIS) ×3 IMPLANT
SUCTION FRAZIER HANDLE 10FR (MISCELLANEOUS) ×2
SUCTION TUBE FRAZIER 10FR DISP (MISCELLANEOUS) ×1 IMPLANT
SUT CHROMIC 3 0 PS 2 (SUTURE) ×12 IMPLANT
SUT CHROMIC 4 0 P 3 18 (SUTURE) IMPLANT
SYR 50ML SLIP (SYRINGE) ×3 IMPLANT
TOWEL OR 17X26 10 PK STRL BLUE (TOWEL DISPOSABLE) ×3 IMPLANT
TUBE CONNECTING 12'X1/4 (SUCTIONS) ×1
TUBE CONNECTING 12X1/4 (SUCTIONS) ×2 IMPLANT
WATER STERILE IRR 1000ML POUR (IV SOLUTION) ×3 IMPLANT
WATER TABLETS ICX (MISCELLANEOUS) ×3 IMPLANT
YANKAUER SUCT BULB TIP NO VENT (SUCTIONS) ×3 IMPLANT

## 2017-07-15 NOTE — Progress Notes (Signed)
Pt is NPO since midnight for surgery, Morning medicines held, IVABX sent to short stay, report provided to short stay RN, preop checklist completed

## 2017-07-15 NOTE — Progress Notes (Signed)
PT Cancellation Note  Patient Details Name: Karin Griffith MRN: [id number] DOB: 1941/09/18   Cancelled Treatment:    Reason Eval/Treat Not Completed: Patient at procedure or test/unavailable Pt having dental surgery this morning. Will follow up in the afternoon as able.  Mehdi Gironda B. Migdalia Dk PT, DPT Acute Rehabilitation  [phone] Pager [phone]  [place] 07/15/2017, 9:24 AM

## 2017-07-15 NOTE — Progress Notes (Signed)
The patient did well with surgery.  Dr. Burt Knack: Please restart Lovenox therapy at 11:59 PM at your discretion.  Patient should be ready for PCI and stenting on Wednesday as discussed before.  Patient may need Amicar 5% rinses every hour and a swish and spit manner for persistent oozing as needed.  Dr. Enrique Sack   I ordered this rinse, but he is wearing SCDs so I didn't order Lovenox because he would basically get one dose tonight and one tomorrow AM before we hold for PCI. Let me know if you want otherwise.

## 2017-07-15 NOTE — Anesthesia Procedure Notes (Signed)
Procedure Name: Intubation Date/Time: 07/15/2017 10:34 AM Performed by: Willeen Cass P Pre-anesthesia Checklist: Patient identified, Emergency Drugs available, Suction available, Patient being monitored and Timeout performed Patient Re-evaluated:Patient Re-evaluated prior to induction Oxygen Delivery Method: Circle System Utilized Preoxygenation: Pre-oxygenation with 100% oxygen Induction Type: IV induction Ventilation: Mask ventilation without difficulty Laryngoscope Size: Mac and 3 Grade View: Grade I Nasal Tubes: Left, Nasal prep performed, Nasal Rae and Magill forceps- large, utilized Tube size: 7.0 mm Number of attempts: 1 Placement Confirmation: ETT inserted through vocal cords under direct vision,  positive ETCO2 and breath sounds checked- equal and bilateral Secured at: 24 cm Tube secured with: Tape Dental Injury: Teeth and Oropharynx as per pre-operative assessment

## 2017-07-15 NOTE — Progress Notes (Addendum)
Pt is still bleeding from oral cavity and gauze is being changed frequently, ICE collar in place, pt is not complaining any pain so far, IV RL is running this time @10cc /hr, will continue to monitor, vitals stable

## 2017-07-15 NOTE — Progress Notes (Addendum)
Patient Name: Edward Strickland Date of Encounter: 07/15/2017  Primary Cardiologist: Dr. Tyrell Antonio Problem List     Principal Problem:   Acute on chronic systolic CHF (congestive heart failure) ([place]) Active Problems:   Aortic stenosis, severe   CKD (chronic kidney disease), stage III   Essential hypertension   COPD (chronic obstructive pulmonary disease) ([facility])   Pleural effusion     Subjective   No complaints. Feeling much better.   Inpatient Medications    Scheduled Meds: . aspirin EC  81 mg Oral Daily  . atorvastatin  80 mg Oral q1800  . enoxaparin (LOVENOX) injection  40 mg Subcutaneous Q24H  . furosemide  20 mg Intravenous Once  . furosemide  40 mg Oral BID  . metoprolol tartrate  12.5 mg Oral BID  . potassium chloride  20 mEq Oral BID  . sodium chloride flush  3 mL Intravenous Q12H   Continuous Infusions: . sodium chloride    .  ceFAZolin (ANCEF) IV     PRN Meds: sodium chloride, acetaminophen **OR** acetaminophen, lidocaine (PF), ondansetron **OR** ondansetron (ZOFRAN) IV, sodium chloride flush   Vital Signs    Vitals:   07/14/17 1207 07/14/17 1600 07/14/17 2058 07/15/17 0623  BP: (!) 93/49 110/60 96/65 107/62  Pulse: 69  79 87  Resp: 18  18 16   Temp: 97.7 F (36.5 C)  98.2 F (36.8 C) 97.7 F (36.5 C)  TempSrc: Oral  Oral Oral  SpO2: 93%  (!) 89% 92%  Weight:    138 lb 8 oz (62.8 kg)  Height:        Intake/Output Summary (Last 24 hours) at 07/15/17 0842 Last data filed at 07/15/17 0837  Gross per 24 hour  Intake              720 ml  Output              800 ml  Net              -80 ml   Filed Weights   07/13/17 0500 07/14/17 0540 07/15/17 0623  Weight: 143 lb 3.2 oz (65 kg) 143 lb 8 oz (65.1 kg) 138 lb 8 oz (62.8 kg)    Physical Exam   GEN: Well nourished, well developed, in no acute distress.  HEENT: Grossly normal.  Neck: Supple, no JVD, carotid upstrokes delayed with bilateral bruits. no masses. Cardiac: RRR, 3/6 harsh late  peaking systolic murmur at RUSB/LLSB. No rubs or gallops. No clubbing, cyanosis. No pretibial edema with skin mottling  Radials/DP/PT 2+ and equal bilaterally.  Respiratory:  Respirations regular and unlabored, diminished breath sounds R> L, mild wheezing GI: Soft, nontender, nondistended, BS + x 4. MS: no deformity or atrophy. Skin: warm and dry, no rash. chronic stasis changes L>R Neuro:  Strength and sensation are intact. Psych: AAOx3.  Normal affect.  Labs    CBC No results for input(s): WBC, NEUTROABS, HGB, HCT, MCV, PLT in the last 72 hours. Basic Metabolic Panel  Recent Labs  07/14/17 0429 07/15/17 0446  NA 138 135  K 4.5 4.5  CL 101 98*  CO2 31 27  GLUCOSE 93 99  BUN 26* 27*  CREATININE 1.13 1.13  CALCIUM 8.8* 8.8*  MG 2.3  --    Liver Function Tests No results for input(s): AST, ALT, ALKPHOS, BILITOT, PROT, ALBUMIN in the last 72 hours. No results for input(s): LIPASE, AMYLASE in the last 72 hours. Cardiac Enzymes No results for input(s): CKTOTAL,  CKMB, CKMBINDEX, TROPONINI in the last 72 hours. BNP Invalid input(s): POCBNP D-Dimer No results for input(s): DDIMER in the last 72 hours. Hemoglobin A1C No results for input(s): HGBA1C in the last 72 hours. Fasting Lipid Panel No results for input(s): CHOL, HDL, LDLCALC, TRIG, CHOLHDL, LDLDIRECT in the last 72 hours. Thyroid Function Tests No results for input(s): TSH, T4TOTAL, T3FREE, THYROIDAB in the last 72 hours.  Invalid input(s): FREET3  Telemetry    NSR - Personally Reviewed  ECG    sinus with 1st deg AV block, LVH with repol abnormality, HR 74 - Personally Reviewed  Radiology    No results found.  Cardiac Studies   2D ECHO: 07/09/2017 LV EF: 25% -   30% Study Conclusion - Left ventricle: The cavity size was mildly dilated. Wall   thickness was increased in a pattern of mild LVH. Systolic   function was severely reduced. The estimated ejection fraction   was in the range of 25% to 30%.  Diffuse hypokinesis. - Aortic valve: Valve mobility was restricted. There was severe   stenosis. There was mild to moderate regurgitation. Valve area   (VTI): 0.41 cm^2. Valve area (Vmax): 0.35 cm^2. Valve area   (Vmean): 0.39 cm^2. - Left atrium: The atrium was moderately dilated. - Right ventricle: The cavity size was moderately dilated. Systolic   function was moderately reduced. - Right atrium: The atrium was moderately dilated. - Pulmonary arteries: Systolic pressure was severely increased. PA   peak pressure: 79 mm Hg (S). - Pericardium, extracardiac: There was a left pleural effusion. Impressions: - Severe global reduction in LV systolic function; mild LVE and   LVH; calcified aortic valve with severe AS and mild to moderate   AI; moderate LAE; moderate RVE with moderately reduced function;   mild TR with severely elevated pulmonary pressure.   07/10/17 RIGHT HEART CATH AND CORONARY ANGIOGRAPHY  Conclusion  1. Severe single-vessel coronary artery disease with severe stenosis in the mid LAD and otherwise mild diffuse nonobstructive calcific coronary disease 2. Known severe aortic stenosis with very heavy calcification of the aortic valve on plain fluoroscopy and severely restricted aortic leaflet mobility 3. Severe pulmonary hypertension likely secondary to left heart disease with severely elevated pulmonary capillary wedge pressure  Recommendation: Continued evaluation by the multidisciplinary heart valve team. Likely proceed with PCI of the LAD followed by TAVR pending cardiac surgical consultation    Cardiac CT 07/12/17 IMPRESSION: 1. Trileaflet aortic valve with heavily calcified leaflets and severe leaflet opening restriction. There are significant asymmetric calcifications extending into the LVOT under the non-coronary leaflet. Annular measurements suitable for delivery of a 29 mm Edwards-SAPIEN valve. 2. Sufficient annulus to coronary distance. 3. Optimum  Fluoroscopic Angle for Delivery:  LAU 7 CAU 7. 4. No Thrombus in the left atrial appendage. 5. Dilated pulmonary artery measuring 33 x 32 mm suggestive of pulmonary hypertension.   Carotid doppler 07/14/17 Summary: Bilateral: mild soft plaque CCA. Mild mixed plaque origin and proximal ICA. 1-39% ICA plaquing. vertebral artery flow is antegrade.   Patient Profile     Dowell Hoon is a 76 y.o. male with a hx of HTN, aortic stenosis, and COPD who was transferred to [facility] from [place] on 07/09/17 for further work up of his severe AS and new systolic CHF.   Assessment & Plan    Severe symptomatic stage D AS: echo from [facility] showed severe aortic stenosis with peak gradient of 89 mmHg and mean transvalvular gradient of 51 mmHg. Repeat echo here shows  the aortic valve is severely calcified and markedly restricted. There is at least mild aortic insufficiency. DVI 0.16. [facility] 9/12 showed single vessel LAD stenosis that will require revascularization. Undergoing pre TAVR work up. Cardiac CT shows anatomy suitable for an Edwards 25mm valve. CT chest/abdomen/pelvis still pending read.   New systolic CHF: EF 94-07% by echo. Likely LV dilation 2/2 severe AS. He was diuresed IV lasix. Net neg 5.4L. Weight down 12 lbs ( 150--> 138). Creat stable. Continue po lasix 40mg  BID.   CAD: cath 9/12 showed 90% mLAD stenosis that will require revascularization. Plan for PCI of the LAD on 9/19. Will plan to load with plavix tomorrow night or weds AM depending on how mouth is healing. Continue ASA 81mg  daily, Lopressor 12.5mg  BID and atorvastatin 80mg  daily.   Right pleural effusion: likely 2/2 CHF/high RA pressures. s/p IR thoracentesis 9/13 which drained 2 L from the right pleural space.   AKI: creat now back to baseline.   COPD: PFTS showed severe obstruction   HTN: BP soft but stable.   Dental: he has dental surgery for multiple extractions today with Dr. Enrique Sack.   Dispo: he does not have a lot of  social support and will likely need short term placement in a SNF at discharge.   Signed, Angelena Form, PA-C  07/15/2017, 8:42 AM    Pt seen in PACU yesterday after undergoing dental extraction. He was stable with no new complaints, but still waking up from anesthesia. I agree with the plans as outlined.  Jung, [place] 07/16/2017 2:19 PM

## 2017-07-15 NOTE — Anesthesia Postprocedure Evaluation (Signed)
Anesthesia Post Note  Patient: Edward Strickland  Procedure(s) Performed: Procedure(s) (LRB): Extraction of tooth #'s 4,6,7,8,9,15, 20,21,22, 23 ,24,26,and 27 with alveoloplassty (N/A)     Patient location during evaluation: PACU Anesthesia Type: General Level of consciousness: awake and alert Pain management: pain level controlled Vital Signs Assessment: post-procedure vital signs reviewed and stable Respiratory status: spontaneous breathing, nonlabored ventilation, respiratory function stable and patient connected to nasal cannula oxygen Cardiovascular status: blood pressure returned to baseline and stable Postop Assessment: no apparent nausea or vomiting Anesthetic complications: no    Last Vitals:  Vitals:   07/15/17 1226 07/15/17 1230  BP: 133/68   Pulse: (!) 106 (!) 105  Resp: (!) 21 20  Temp:    SpO2: 97% 99%    Last Pain:  Vitals:   07/15/17 0712  TempSrc:   PainSc: 0-No pain                 Deanndra Kirley

## 2017-07-15 NOTE — Progress Notes (Signed)
PRE-OPERATIVE NOTE:  07/15/2017 Edward Strickland [id number]  VITALS: BP 107/62 (BP Location: Right Arm)   Pulse 87   Temp 97.7 F (36.5 C) (Oral)   Resp 16   Ht 5\' 6"  (1.676 m)   Wt 138 lb 8 oz (62.8 kg)   SpO2 92%   BMI 22.35 kg/m   Lab Results  Component Value Date   WBC 11.4 (H) 07/09/2017   HGB 12.7 (L) 07/09/2017   HCT 40.1 07/09/2017   MCV 90.7 07/09/2017   PLT 234 07/09/2017   BMET    Component Value Date/Time   NA 135 07/15/2017 0446   K 4.5 07/15/2017 0446   CL 98 (L) 07/15/2017 0446   CO2 27 07/15/2017 0446   GLUCOSE 99 07/15/2017 0446   BUN 27 (H) 07/15/2017 0446   CREATININE 1.13 07/15/2017 0446   CALCIUM 8.8 (L) 07/15/2017 0446   GFRNONAA >60 07/15/2017 0446   GFRAA >60 07/15/2017 0446    Lab Results  Component Value Date   INR 1.12 07/10/2017   No results found for: PTT   Edward Strickland is scheduled for multiple dental extractions with alveoloplasty and pre-prosthetic surgery as needed in the operating room general anesthesia. I again addressed the potential for sinus involvement / perforation due to the extensive pneumatization of the bilateral maxillary sinuses and potential involvement with the extraction of the upper right premolar and upper left molar.  SUBJECTIVE: The patient denies any acute medical or dental changes and agrees to proceed with treatment as planned.  EXAM: No sign of acute dental changes.  ASSESSMENT: Patient is affected by chronic apical periodontitis, retained root segment, chronic periodontitis, loose teeth, and dental caries.  PLAN: Patient agrees to proceed with treatment as planned in the operating room as previously discussed and accepts the risks, benefits, and complications of the proposed treatment. Patient is aware of the risk for bleeding, bruising, swelling, infection, pain, nerve damage, sinus involvement, soft tissue involvement, root tip fracture, mandible fracture, and the risks of complications associated with  the anesthesia. Patient also is aware of the potential for other complications up to and including death due to his overall cardiovascular and respiratory compromise.    Lenn Cal, DDS

## 2017-07-15 NOTE — Op Note (Signed)
OPERATIVE REPORT  Patient:            Edward Strickland Date of Birth:  [birth year] MRN:                [id number]   DATE OF PROCEDURE:  07/15/2017  PREOPERATIVE DIAGNOSES: 1. Critical aortic stenosis 2. Pre-heart valve surgery dental protocol 3. Chronic apical periodontitis 4. Retained root segment 5. Dental caries 6. Chronic periodontitis 7. Loose teeth  POSTOPERATIVE DIAGNOSES: 1. Critical aortic stenosis 2. Pre-heart valve surgery dental protocol 3. Chronic apical periodontitis 4. Retained root segment 5. Dental caries 6. Chronic periodontitis 7. Loose teeth  OPERATIONS: 1. Multiple extraction of tooth numbers 4,6-9, 15, 20-24, 26, and 27. 2. 4 Quadrants of alveoloplasty   SURGEON: Lenn Cal, DDS  ASSISTANT: Camie Patience, (dental assistant)  ANESTHESIA: General anesthesia via nasoendotracheal tube.  MEDICATIONS: 1. Ancef 2 g IV prior to invasive dental procedures. 2. Local anesthesia with a total utilization of 6 carpules each containing 34 mg of lidocaine with 0.017 mg of epinephrine as well as 2 carpules each containing 9 mg of bupivacaine with 0.009 mg of epinephrine.  SPECIMENS: There are 13 teeth that were discarded.  DRAINS: None  CULTURES: None  COMPLICATIONS: None  ESTIMATED BLOOD LOSS: 100 mLs.  INTRAVENOUS FLUIDS: 500 mLs of Lactated ringers solution.  INDICATIONS: The patient was recently diagnosed with critical aortic stenosis.  A medically necessary dental consultation was then requested to evaluate poor dentition.  The patient was examined and treatment planned for extraction of remaining teeth with alveoloplasty in the operating room with general anesthesia.  This treatment plan was formulated to decrease the risks and complications associated with dental infection from affecting the patient's systemic health and the anticipated heart valve surgery.  OPERATIVE FINDINGS: Patient was examined operating room number 19.  The teeth were  identified for extraction. The patient was noted be affected by chronic apical periodontitis, retained root segment, dental caries, chronic periodontitis, and loose teeth.  DESCRIPTION OF PROCEDURE: Patient was brought to the main operating room number 19. Patient was then placed in the supine position on the operating table. General anesthesia was then induced per the anesthesia team. The patient was then prepped and draped in the usual manner for dental medicine procedure. A timeout was performed. The patient was identified and procedures were verified. A throat pack was placed at this time. The oral cavity was then thoroughly examined with the findings noted above. The patient was then ready for dental medicine procedure as follows:  Local anesthesia was then administered sequentially with a total utilization of 6 carpules each containing 34 mg of lidocaine with 0.017 mg of epinephrine as well as 2 carpules  each containing 9 mg bupivacaine with 0.009 mg of epinephrine.  The Maxillary left and right quadrants first approached. Anesthesia was then delivered utilizing infiltration with lidocaine with epinephrine. A #15 blade incision was then made from the distal of #2 and extended to the distal of #11.  A  surgical flap was then carefully reflected. The maxillary teeth were  was subluxated and removed with a 150 forceps without complications.  Alveoloplasty was then performed utilizing a ronguers and bone file to help achieve primary closure.  No apparent sinus involvement was noted in the area of number 4. The surgical site was then irrigated with copious amounts of sterile saline. A piece of Surgifoam was placed in the extraction sockets appropriately. The tissues were approximated and trimmed appropriately. The surgical site was then closed  from the maxillary right tuberosity and extended to the mesial of #8 utilizing 3-0 chromic gut suture in a continuous interrupted suture technique 1. The maxillary  left surgical site was then closed from the distal of #11 extended the mesial #9 utilizing 3-0 chromic gut suture in a continuous interrupted suture technique 1.  A second 15 blade incision was then made from the distal of maxillary left tuberosity and extended to the mesial of #13. A surgical flap was then carefully reflected. Tooth #15 was then subluxated and removed with a 53L forceps without complications.  Alveoloplasty was then carefully performed utilizing a rongeurs and bone file to achieve primary closure. The sinus was noted in the area of #15.  The surgical site was irrigated with copious amounts sterile saline.  A piece of Surgifoam was placed in the extraction socket area #15.  The surgical site was then closed from the maxillary left tuberosity and extended to the mesial of #12 utilizing 3-0 chromic gut suture in a continuous interrupted suture technique 1. Primary closure was obtained. The patient will be provided with sinus precautions postoperatively.  At this point in time, the mandibular quadrants were approached. The patient was given bilateral inferior alveolar nerve blocks and long buccal nerve blocks utilizing the bupivacaine with epinephrine. Further infiltration was then achieved utilizing the lidocaine with epinephrine. A 15 blade incision was then made from the distal of number 18 and extended to the distal of #30.  A surgical flap was then carefully reflected. The lower teeth were then subluxated with a series straight elevators. Appropriate amounts of buccal and interseptal bone were then removed utilizing a surgical handpiece and copious amount of sterile water around tooth numbers 20, 21, 22, and 27.  The lower teeth were then subluxated again with a series of straight elevators. Tooth numbers 20, 21, 22, 23, 24, 26, and 27 were then removed with a 151 forceps without complications. Alveoloplasty was then performed utilizing a rongeurs and bone file to help achieve primary  closure. The tissues were approximated and trimmed appropriately. The surgical site was then irrigated with copious amounts sterile saline 4. The mandibular left surgical site was then closed from the distal of #18 and extended to the mesial #24 utilizing 3-0 chromic gut suture in a continuous interrupted suture technique 1. The mandibular right surgical site was then closed from the distal of #30 and extended the mesial #25 utilizing 3-0 chromic suture in a continuous interrupted suture technique 1. 3 individual interrupted sutures then placed to close surgical site utilizing 3-0 chromic gut material.  At this point time, the entire mouth was irrigated with copious amounts of sterile saline. The patient was examined for complications, seeing none, the dental medicine procedure was deemed to be complete. The throat pack was removed at this time. An oral airway was then placed at the request of the anesthesia team. A series of 4 x 4 gauze were placed in the mouth to aid hemostasis. The patient was then handed over to the anesthesia team for final disposition. After an appropriate amount of time, the patient was extubated and taken to the postanesthsia care unit in good condition. All counts were correct for the dental medicine procedure. The patient is to be restarted on Lovenox therapy at the discretion of Dr. Burt Knack at 11:59 PM.  The patient is to proceed with percutaneous intervention and stenting with Dr. Burt Knack at his discretion. Patient will then proceed with aortic valve replacement as the discretion of Dr. Burt Knack  and TCTS.     Lenn Cal, DDS.

## 2017-07-15 NOTE — Transfer of Care (Signed)
Immediate Anesthesia Transfer of Care Note  Patient: Edward Strickland  Procedure(s) Performed: Procedure(s): Extraction of tooth #'s 4,6,7,8,9,15, 20,21,22, 23 ,24,26,and 27 with alveoloplassty (N/A)  Patient Location: PACU  Anesthesia Type:General  Level of Consciousness: awake, oriented and patient cooperative  Airway & Oxygen Therapy: Patient Spontanous Breathing and Patient connected to face mask oxygen  Post-op Assessment: Report given to RN, Post -op Vital signs reviewed and stable and Patient moving all extremities X 4  Post vital signs: Reviewed and stable  Last Vitals:  Vitals:   07/15/17 0623 07/15/17 0937  BP: 107/62 107/64  Pulse: 87 81  Resp: 16   Temp: 36.5 C   SpO2: 92%     Last Pain:  Vitals:   07/15/17 0712  TempSrc:   PainSc: 0-No pain         Complications: No apparent anesthesia complications

## 2017-07-15 NOTE — Anesthesia Preprocedure Evaluation (Addendum)
Anesthesia Evaluation  Patient identified by MRN, date of birth, ID band Patient awake    Reviewed: Allergy & Precautions, NPO status , Patient's Chart, lab work & pertinent test results  Airway Mallampati: II  TM Distance: >3 FB Neck ROM: Full    Dental no notable dental hx.    Pulmonary neg pulmonary ROS, COPD, former smoker,    Pulmonary exam normal breath sounds clear to auscultation       Cardiovascular hypertension, Pt. on medications + CAD and +CHF  negative cardio ROS Normal cardiovascular exam Rhythm:Regular Rate:Normal     Neuro/Psych negative neurological ROS  negative psych ROS   GI/Hepatic negative GI ROS, Neg liver ROS,   Endo/Other  negative endocrine ROS  Renal/GU Renal diseasenegative Renal ROS  negative genitourinary   Musculoskeletal negative musculoskeletal ROS (+)   Abdominal   Peds negative pediatric ROS (+)  Hematology negative hematology ROS (+)   Anesthesia Other Findings EKG Sinus rhythm with 1st degree A-V block Left ventricular hypertrophy with repolarization abnormality  Cath 9/18 1. Severe single-vessel coronary artery disease with severe stenosis in the mid LAD and otherwise mild diffuse nonobstructive calcific coronary disease 2. Known severe aortic stenosis with very heavy calcification of the aortic valve on plain fluoroscopy and severely restricted aortic leaflet mobility 3. Severe pulmonary hypertension likely secondary to left heart disease with severely elevated pulmonary capillary wedge pressure   ECHO 9/18 Severe global reduction in LV systolic function; mild LVE and   LVH; calcified aortic valve with severe AS and mild to moderate   AI; moderate LAE; moderate RVE with moderately reduced function;   mild TR with severely elevated pulmonary pressure.  Reproductive/Obstetrics negative OB ROS                           Anesthesia Physical Anesthesia  Plan  ASA: III  Anesthesia Plan: General   Post-op Pain Management:    Induction: Intravenous  PONV Risk Score and Plan: 2 and Ondansetron, Dexamethasone, Treatment may vary due to age or medical condition and Midazolam  Airway Management Planned: Oral ETT  Additional Equipment: Arterial line  Intra-op Plan:   Post-operative Plan: Extubation in OR  Informed Consent:   Plan Discussed with:   Anesthesia Plan Comments: (  )       Anesthesia Quick Evaluation

## 2017-07-16 ENCOUNTER — Encounter (HOSPITAL_COMMUNITY): Payer: Self-pay | Admitting: Dentistry

## 2017-07-16 DIAGNOSIS — I350 Nonrheumatic aortic (valve) stenosis: Secondary | ICD-10-CM

## 2017-07-16 DIAGNOSIS — I10 Essential (primary) hypertension: Secondary | ICD-10-CM

## 2017-07-16 DIAGNOSIS — I2510 Atherosclerotic heart disease of native coronary artery without angina pectoris: Secondary | ICD-10-CM

## 2017-07-16 DIAGNOSIS — K08199 Complete loss of teeth due to other specified cause, unspecified class: Secondary | ICD-10-CM

## 2017-07-16 DIAGNOSIS — J449 Chronic obstructive pulmonary disease, unspecified: Secondary | ICD-10-CM

## 2017-07-16 LAB — BASIC METABOLIC PANEL
Anion gap: 8 (ref 5–15)
BUN: 33 mg/dL — AB (ref 6–20)
CALCIUM: 8.5 mg/dL — AB (ref 8.9–10.3)
CHLORIDE: 97 mmol/L — AB (ref 101–111)
CO2: 28 mmol/L (ref 22–32)
CREATININE: 1.46 mg/dL — AB (ref 0.61–1.24)
GFR calc non Af Amer: 45 mL/min — ABNORMAL LOW (ref >60)
GFR, EST AFRICAN AMERICAN: 52 mL/min — AB (ref >60)
GLUCOSE: 108 mg/dL — AB (ref 65–99)
Potassium: 5.1 mmol/L (ref 3.5–5.1)
Sodium: 133 mmol/L — ABNORMAL LOW (ref 135–145)

## 2017-07-16 LAB — CULTURE, BODY FLUID W GRAM STAIN -BOTTLE

## 2017-07-16 LAB — CBC
HCT: 40.5 % (ref 39.0–52.0)
Hemoglobin: 13.1 g/dL (ref 13.0–17.0)
MCH: 28.9 pg (ref 26.0–34.0)
MCHC: 32.3 g/dL (ref 30.0–36.0)
MCV: 89.2 fL (ref 78.0–100.0)
PLATELETS: 237 10*3/uL (ref 150–400)
RBC: 4.54 MIL/uL (ref 4.22–5.81)
RDW: 14.7 % (ref 11.5–15.5)
WBC: 12.5 10*3/uL — ABNORMAL HIGH (ref 4.0–10.5)

## 2017-07-16 LAB — CULTURE, BODY FLUID-BOTTLE: CULTURE: NO GROWTH

## 2017-07-16 MED ORDER — FUROSEMIDE 40 MG PO TABS
40.0000 mg | ORAL_TABLET | Freq: Every day | ORAL | Status: DC
  Administered 2017-07-18 – 2017-07-22 (×5): 40 mg via ORAL
  Filled 2017-07-16 (×5): qty 1

## 2017-07-16 MED ORDER — CLOPIDOGREL BISULFATE 75 MG PO TABS
600.0000 mg | ORAL_TABLET | Freq: Once | ORAL | Status: AC
  Administered 2017-07-16: 600 mg via ORAL
  Filled 2017-07-16: qty 8

## 2017-07-16 MED ORDER — HEPARIN SODIUM (PORCINE) 5000 UNIT/ML IJ SOLN
5000.0000 [IU] | Freq: Three times a day (TID) | INTRAMUSCULAR | Status: DC
  Administered 2017-07-16 – 2017-07-17 (×3): 5000 [IU] via SUBCUTANEOUS
  Filled 2017-07-16 (×3): qty 1

## 2017-07-16 MED ORDER — POTASSIUM CHLORIDE CRYS ER 20 MEQ PO TBCR
20.0000 meq | EXTENDED_RELEASE_TABLET | Freq: Every day | ORAL | Status: DC
  Administered 2017-07-18 – 2017-07-26 (×8): 20 meq via ORAL
  Filled 2017-07-16 (×9): qty 1

## 2017-07-16 MED ORDER — CLOPIDOGREL BISULFATE 75 MG PO TABS
75.0000 mg | ORAL_TABLET | Freq: Every day | ORAL | Status: DC
  Administered 2017-07-18 – 2017-07-26 (×8): 75 mg via ORAL
  Filled 2017-07-16 (×8): qty 1

## 2017-07-16 MED ORDER — ENSURE ENLIVE PO LIQD
237.0000 mL | Freq: Two times a day (BID) | ORAL | Status: DC
  Administered 2017-07-18 – 2017-07-25 (×13): 237 mL via ORAL
  Filled 2017-07-16 (×3): qty 237

## 2017-07-16 NOTE — Progress Notes (Signed)
POST OPERATIVE NOTE: Postop day #1.  07/16/2017 Clenton Pare [phone]  VITALS: BP (!) 90/54 (BP Location: Left Arm)   Pulse 88   Temp 97.6 F (36.4 C) (Oral)   Resp 18   Ht 5\' 6"  (1.676 m)   Wt 141 lb 12.8 oz (64.3 kg)   SpO2 99%   BMI 22.89 kg/m   LABS:  Lab Results  Component Value Date   WBC 11.4 (H) 07/09/2017   HGB 12.7 (L) 07/09/2017   HCT 40.1 07/09/2017   MCV 90.7 07/09/2017   PLT 234 07/09/2017   BMET    Component Value Date/Time   NA 133 (L) 07/16/2017 0532   K 5.1 07/16/2017 0532   CL 97 (L) 07/16/2017 0532   CO2 28 07/16/2017 0532   GLUCOSE 108 (H) 07/16/2017 0532   BUN 33 (H) 07/16/2017 0532   CREATININE 1.46 (H) 07/16/2017 0532   CALCIUM 8.5 (L) 07/16/2017 0532   GFRNONAA 45 (L) 07/16/2017 0532   GFRAA 52 (L) 07/16/2017 0532    Lab Results  Component Value Date   INR 1.12 07/10/2017   No results found for: PTT   Monica Zahler is status post extraction of remaining teeth with alveoloplasty in the operating room with general anesthesia on 07/16/17.  SUBJECTIVE: Patient with minimal complaints. The patient denies any active bleeding at this time.  EXAM: There is no sign of infection, heme, or ooze. Sutures are intact. Clots present. Patient has significant intraoral ecchymoses and some extraoral bruising. The patient is now edentulous. There is atrophy of the edentulous alveolar ridges.  The patient denies having any sinus problems or symptoms.  ASSESSMENT: Post operative course is consistent with dental procedures performed in the OR on 07/15/17. Patient is edentulous. There is atrophy of edentulous alveolar ridges.  PLAN: 1. Rinse with Chlorhexidine rinses twice daily after breakfast and at bedtime. 2. Use salt water rinses every 2 hours while awake in between the chlorhexidine rinses. 3. Advance diet as tolerated to a soft diet 4. Dr. Burt Knack to proceed with percutaneous intervention and stenting at his discretion.   Lenn Cal,  DDS

## 2017-07-16 NOTE — Progress Notes (Signed)
On call for Cardiology paged to make aware of BP.

## 2017-07-16 NOTE — Progress Notes (Signed)
Initial Nutrition Assessment  DOCUMENTATION CODES:   Non-severe (moderate) malnutrition in context of chronic illness  INTERVENTION:   -recommend Ensure Enlive po BID, each supplement provides 350 kcal and 20 grams of protein  -recommend advancing diet as tolerated to Dysphagia III (easy to chew)   NUTRITION DIAGNOSIS:   Malnutrition (Moderate) related to chronic illness (CHF, CAD) as evidenced by mild depletion of muscle mass, mild depletion of body fat.  GOAL:   Patient will meet greater than or equal to 90% of their needs  MONITOR:   PO intake, Supplement acceptance, Labs, Weight trends, Diet advancement  REASON FOR ASSESSMENT:   Consult  (pt is edentulous)  ASSESSMENT:   76 yo male admitted with severe symptomatic aortic stenosis, CHF, AKI. Pt with hx of CHF, HTN. ECHO on admission with EF 20-25%  Plan for PCI, TAVR  9/17 Dental extraction, pt now edentulous  Pt reports tolerating liquid diet post extraction.  Pt not a good historian. Reports intake not great due to poor dentition but indicates that he still ate 3 meals per day but like a mouse. Unable to tell writer much more. Recorded po intake prior to surgery 95-100%.   Pt unsure of weight status PTA Weight down 9 pounds, Net negative 5.4 L  Nutrition-Focused physical exam completed. Findings are mild/moderate fat depletion, mild/moderate muscle depletion, and mild edema.   Labs: reviewed Meds: lasix, KCl  Diet Order:  Diet clear liquid Room service appropriate? No; Fluid consistency: Thin Diet NPO time specified Except for: Sips with Meds  Skin:  Reviewed, no issues  Last BM:  9/18  Height:   Ht Readings from Last 1 Encounters:  07/09/17 5\' 6"  (1.676 m)    Weight:   Wt Readings from Last 1 Encounters:  07/16/17 141 lb 12.8 oz (64.3 kg)    Ideal Body Weight:     BMI:  Body mass index is 22.89 kg/m.  Estimated Nutritional Needs:   Kcal:  1017-5102 kcals  Protein:  88-98 g  Fluid:   >/= 1.8 L  EDUCATION NEEDS:   No education needs identified at this time  [place], [place], LDN [phone] Pager  [phone] Weekend/On-Call Pager

## 2017-07-16 NOTE — Progress Notes (Addendum)
Physical Therapy Treatment Patient Details Name: Edward Strickland MRN: [id number] DOB: 05-Jun-1941 Today's Date: 07/16/2017    History of Present Illness Pt is a 76 yo male who presented to ED with SOB and LE swelling, recently diagnosed with critical aortic stenosis, PMH for HTN, CKD, and CHF.     PT Comments    Pre-TAVR assessment administered today and pt able to ambulate continuously throughout the 6 minute walk test with no need for break. Pt continues to be min guard for transfers and ambulation. Pt will require continued skilled PT to improve strength and endurance in preparation for TAVR procedure later this week.    Follow Up Recommendations  Home health PT;Supervision - Intermittent     Equipment Recommendations  None recommended by PT    Recommendations for Other Services       Precautions / Restrictions Precautions Precautions: Fall Restrictions Weight Bearing Restrictions: No    Mobility  Bed Mobility               General bed mobility comments: in recliner at entry  Transfers Overall transfer level: Needs assistance Equipment used: None Transfers: Sit to/from Stand Sit to Stand: Min guard         General transfer comment: min guard for safety, pt able to self steady before reaching for RW  Ambulation/Gait Ambulation/Gait assistance: Min guard Ambulation Distance (Feet): 750 Feet Assistive device: Rolling walker (2 wheeled);Straight cane Gait Pattern/deviations: Step-through pattern;Trunk flexed;Decreased step length - right;Decreased step length - left Gait velocity: slowed Gait velocity interpretation: Below normal speed for age/gender General Gait Details: min guard for safety,        Balance Overall balance assessment: Needs assistance Sitting-balance support: No upper extremity supported;Feet supported Sitting balance-Leahy Scale: Good     Standing balance support: No upper extremity supported;During functional activity Standing  balance-Leahy Scale: Fair                              Cognition Arousal/Alertness: Awake/alert Behavior During Therapy: WFL for tasks assessed/performed Overall Cognitive Status: No family/caregiver present to determine baseline cognitive functioning                                 General Comments: sometimes have to repeat or rephrase questions for understanding      Exercises      General Comments General comments (skin integrity, edema, etc.): VSS      Pertinent Vitals/Pain Pain Assessment: No/denies pain           PT Goals (current goals can now be found in the care plan section) Acute Rehab PT Goals Patient Stated Goal: go home PT Goal Formulation: With patient Time For Goal Achievement: 07/25/17 Potential to Achieve Goals: Good    Frequency    Min 3X/week      PT Plan Current plan remains appropriate       AM-PAC PT "6 Clicks" Daily Activity  Outcome Measure  Difficulty turning over in bed (including adjusting bedclothes, sheets and blankets)?: A Little Difficulty moving from lying on back to sitting on the side of the bed? : A Little Difficulty sitting down on and standing up from a chair with arms (e.g., wheelchair, bedside commode, etc,.)?: None Help needed moving to and from a bed to chair (including a wheelchair)?: None Help needed walking in hospital room?: None Help needed climbing 3-5 steps with  a railing? : A Little 6 Click Score: 21    End of Session Equipment Utilized During Treatment: Gait belt;Oxygen Activity Tolerance: Patient tolerated treatment well Patient left: in chair;with call bell/phone within reach;with chair alarm set Nurse Communication: Mobility status PT Visit Diagnosis: Other abnormalities of gait and mobility (R26.89);Muscle weakness (generalized) (M62.81);Difficulty in walking, not elsewhere classified (R26.2)     Time: 3403-5248 PT Time Calculation (min) (ACUTE ONLY): 31 min  Charges:   $Gait Training: 23-37 mins                    G Codes:       Yash Cacciola B. Migdalia Dk PT, DPT Acute Rehabilitation  [phone] Pager [phone]     [place] 07/16/2017, 2:26 PM

## 2017-07-16 NOTE — Progress Notes (Signed)
Pt. With BP of 90/69. On call for [facility] paged to make aware.

## 2017-07-16 NOTE — Progress Notes (Signed)
Patient Name: Edward Strickland Date of Encounter: 07/16/2017  Primary Cardiologist: Dr. Tyrell Antonio Problem List     Principal Problem:   Acute on chronic systolic CHF (congestive heart failure) ([place]) Active Problems:   Aortic stenosis, severe   CKD (chronic kidney disease), stage III   Essential hypertension   COPD (chronic obstructive pulmonary disease) ([facility])   Pleural effusion     Subjective   No complaints. Wonders if his toenail needs to be cut off.   Inpatient Medications    Scheduled Meds: . aminocaproic acid  5 mL Oral Q4H while awake  . aspirin EC  81 mg Oral Daily  . atorvastatin  80 mg Oral q1800  . bacitracin  1 application Topical O3J  . furosemide  40 mg Oral BID  . metoprolol tartrate  12.5 mg Oral BID  . potassium chloride  20 mEq Oral BID  . sodium chloride flush  3 mL Intravenous Q12H   Continuous Infusions: . sodium chloride    . lactated ringers 10 mL/hr at 07/15/17 1451   PRN Meds: sodium chloride, acetaminophen **OR** acetaminophen, aminocaproic acid, lidocaine (PF), ondansetron **OR** ondansetron (ZOFRAN) IV, oxyCODONE-acetaminophen, sodium chloride flush   Vital Signs    Vitals:   07/15/17 1500 07/15/17 1919 07/16/17 0000 07/16/17 0421  BP: (!) 130/50 (!) 99/47 90/69 (!) 90/54  Pulse: 98 90 80 88  Resp:  18  18  Temp:  98.5 F (36.9 C)  97.6 F (36.4 C)  TempSrc:  Axillary  Oral  SpO2: 96% 98%  99%  Weight:    141 lb 12.8 oz (64.3 kg)  Height:        Intake/Output Summary (Last 24 hours) at 07/16/17 0828 Last data filed at 07/16/17 0000  Gross per 24 hour  Intake            931.5 ml  Output             1075 ml  Net           -143.5 ml   Filed Weights   07/14/17 0540 07/15/17 0623 07/16/17 0421  Weight: 143 lb 8 oz (65.1 kg) 138 lb 8 oz (62.8 kg) 141 lb 12.8 oz (64.3 kg)    Physical Exam   GEN: Well nourished, well developed, in no acute distress.  HEENT: Grossly normal.  Neck: Supple, no JVD, carotid upstrokes  delayed with bilateral bruits. no masses. Cardiac: RRR, 3/6 harsh late peaking systolic murmur at RUSB/LLSB. No rubs or gallops. No clubbing, cyanosis. No pretibial edema with skin mottling  Radials/DP/PT 2+ and equal bilaterally.  Respiratory:  Respirations regular and unlabored, diminished breath sounds R> L, mild wheezing GI: Soft, nontender, nondistended, BS + x 4. MS: no deformity or atrophy. Skin: warm and dry, no rash. chronic stasis changes L>R Neuro:  Strength and sensation are intact. Psych: AAOx3.  Normal affect.  Labs    CBC No results for input(s): WBC, NEUTROABS, HGB, HCT, MCV, PLT in the last 72 hours. Basic Metabolic Panel  Recent Labs  07/14/17 0429 07/15/17 0446 07/16/17 0532  NA 138 135 133*  K 4.5 4.5 5.1  CL 101 98* 97*  CO2 31 27 28   GLUCOSE 93 99 108*  BUN 26* 27* 33*  CREATININE 1.13 1.13 1.46*  CALCIUM 8.8* 8.8* 8.5*  MG 2.3  --   --    Liver Function Tests No results for input(s): AST, ALT, ALKPHOS, BILITOT, PROT, ALBUMIN in the last 72 hours. No results  for input(s): LIPASE, AMYLASE in the last 72 hours. Cardiac Enzymes No results for input(s): CKTOTAL, CKMB, CKMBINDEX, TROPONINI in the last 72 hours. BNP Invalid input(s): POCBNP D-Dimer No results for input(s): DDIMER in the last 72 hours. Hemoglobin A1C No results for input(s): HGBA1C in the last 72 hours. Fasting Lipid Panel No results for input(s): CHOL, HDL, LDLCALC, TRIG, CHOLHDL, LDLDIRECT in the last 72 hours. Thyroid Function Tests No results for input(s): TSH, T4TOTAL, T3FREE, THYROIDAB in the last 72 hours.  Invalid input(s): FREET3  Telemetry    NSR - Personally Reviewed  ECG    sinus with 1st deg AV block, LVH with repol abnormality, HR 74 - Personally Reviewed  Radiology    No results found.  Cardiac Studies   2D ECHO: 07/09/2017 LV EF: 25% -   30% Study Conclusion - Left ventricle: The cavity size was mildly dilated. Wall   thickness was increased in a pattern  of mild LVH. Systolic   function was severely reduced. The estimated ejection fraction   was in the range of 25% to 30%. Diffuse hypokinesis. - Aortic valve: Valve mobility was restricted. There was severe   stenosis. There was mild to moderate regurgitation. Valve area   (VTI): 0.41 cm^2. Valve area (Vmax): 0.35 cm^2. Valve area   (Vmean): 0.39 cm^2. - Left atrium: The atrium was moderately dilated. - Right ventricle: The cavity size was moderately dilated. Systolic   function was moderately reduced. - Right atrium: The atrium was moderately dilated. - Pulmonary arteries: Systolic pressure was severely increased. PA   peak pressure: 79 mm Hg (S). - Pericardium, extracardiac: There was a left pleural effusion. Impressions: - Severe global reduction in LV systolic function; mild LVE and   LVH; calcified aortic valve with severe AS and mild to moderate   AI; moderate LAE; moderate RVE with moderately reduced function;   mild TR with severely elevated pulmonary pressure.   07/10/17 RIGHT HEART CATH AND CORONARY ANGIOGRAPHY  Conclusion  1. Severe single-vessel coronary artery disease with severe stenosis in the mid LAD and otherwise mild diffuse nonobstructive calcific coronary disease 2. Known severe aortic stenosis with very heavy calcification of the aortic valve on plain fluoroscopy and severely restricted aortic leaflet mobility 3. Severe pulmonary hypertension likely secondary to left heart disease with severely elevated pulmonary capillary wedge pressure  Recommendation: Continued evaluation by the multidisciplinary heart valve team. Likely proceed with PCI of the LAD followed by TAVR pending cardiac surgical consultation    Cardiac CT 07/12/17 IMPRESSION: 1. Trileaflet aortic valve with heavily calcified leaflets and severe leaflet opening restriction. There are significant asymmetric calcifications extending into the LVOT under the non-coronary leaflet. Annular measurements  suitable for delivery of a 29 mm Edwards-SAPIEN valve. 2. Sufficient annulus to coronary distance. 3. Optimum Fluoroscopic Angle for Delivery:  LAU 7 CAU 7. 4. No Thrombus in the left atrial appendage. 5. Dilated pulmonary artery measuring 33 x 32 mm suggestive of pulmonary hypertension.   Carotid doppler 07/14/17 Summary: Bilateral: mild soft plaque CCA. Mild mixed plaque origin and proximal ICA. 1-39% ICA plaquing. vertebral artery flow is antegrade.   CT angio abdomen/pelvis/chest: 07/12/17 IMPRESSION: 1. Vascular findings and measurements pertinent to potential TAVR procedure, as detailed above. This patient does have suitable pelvic arterial access bilaterally. 2. Severe thickening calcification of the aortic valve, compatible with the reported clinical history of severe aortic stenosis. 3. Findings in the thorax suggestive of underlying congestive heart failure, including moderate pulmonary edema, moderate right and small  a moderate left pleural effusions, and cardiomegaly. 4. Aortic atherosclerosis, in addition to left anterior descending coronary artery disease. Assessment for potential risk factor modification, dietary therapy or pharmacologic therapy may be warranted, if clinically indicated. 5. Colonic diverticulosis without evidence to suggest an acute diverticulitis at this time. 6. Additional incidental findings, as above. Aortic Atherosclerosis (ICD10-I70.0).   Patient Profile     Edward Strickland is a 76 y.o. male with a hx of HTN, aortic stenosis, and COPD who was transferred to [facility] from [place] on 07/09/17 for further work up of his severe AS and new systolic CHF.   Assessment & Plan    Severe symptomatic stage D AS: echo from [facility] showed severe aortic stenosis with peak gradient of 89 mmHg and mean transvalvular gradient of 51 mmHg. Repeat echo here shows the aortic valve is severely calcified and markedly restricted. There is at least mild aortic  insufficiency. DVI 0.16. [facility] 9/12 showed single vessel LAD stenosis that will require revascularization. Undergoing pre TAVR work up. CTs show anatomy suitable for an Edwards 54mm valve and transfemoral access.   New systolic CHF: EF 62-94% by echo. Likely LV dilation 2/2 severe AS. He was diuresed IV lasix. Net neg 5.4L. Weight down 9 lbs ( 150--> 141). Creat bumped to 1.46. I will decrease lasix from 40mg  BID to 40mg  daily and hold tomorrow AM dose before heart cath.   CAD: cath 9/12 showed 90% mLAD stenosis that will require revascularization. Plan for PCI of the LAD on 9/19. Orders placed. Will plan to load with plavix 600mg  tonight. Continue ASA 81mg  daily, Lopressor 12.5mg  BID and atorvastatin 80mg  daily.   Right pleural effusion: likely 2/2 CHF/high RA pressures. s/p IR thoracentesis 9/13 which drained 2 L from the right pleural space.   AKI: creat bumped to 1.45. Holding lasix given heart cath tomorrow.   COPD: PFTS showed severe obstruction   HTN: BP soft. Hold lasix as above and BB for now.   Dental: s/p multiple dental extractions yesterday in the OR  Dispo: he does not have a lot of social support and will likely need short term placement in a SNF at discharge.   SignedAngelena Form, PA-C  07/16/2017, 8:28 AM  Pager [phone]  Patient seen, examined. Available data reviewed. Agree with findings, assessment, and plan as outlined by Nell Range, PA-C. The patient is alert and oriented, in no distress. JVP is normal. There is no peripheral edema. Cardiovascular exam is unchanged. Agree with plans as outlined above. The patient will be loaded with clopidogrel tonight and he is scheduled to undergo PCI of the LAD tomorrow with Dr. Angelena Form. I have reviewed risks, indications, and alternatives to PCI with the patient. He understands and agrees to proceed. Agree that he is at his dry weight and should not be diuresed further especially with his creatinine trending slightly upward.  Because of his critical aortic stenosis, severe LV dysfunction, and lack of any social support, I agree that he should be kept as an inpatient until he undergoes TAVR next week.  Sherren Mocha, M.D. 07/16/2017 2:16 PM

## 2017-07-16 NOTE — Consult Note (Addendum)
[address]       [place] [address]             [phone]          CARDIOTHORACIC SURGERY CONSULTATION REPORT  PCP is Jaclynn Major, NP Referring Provider is Edward Mocha, MD   Reason for consultation:  Severe aortic stenosis and CAD  HPI:  Patient is a 76 year old male with history of aortic stenosis, coronary artery disease, hypertension, and COPD referred for a second surgical opinion to discuss treatment options for management of severe symptomatic aortic stenosis. The patient reportedly presented to the emergency department at [facility] in [place] in 2016 with chest pain. Echocardiogram performed at that time revealed severe aortic stenosis with normal left ventricular systolic function. He was referred for cardiology consultation but apparently never followed up.  He presented acutely to [facility] 07/07/2017 with dyspnea on exertion and severe bilateral lower extremity edema. He was notably hypoxemic on presentation chest x-ray revealed congestive heart failure with bilateral pleural effusions, right greater than left. Troponin levels were minimally elevated and remained stable.  Transthoracic echocardiogram was performed demonstrating severe left ventricular systolic dysfunction with ejection fraction estimated 20-25%. There was severe aortic stenosis with mean gradient estimated 51 mmHg and aortic valve area calculated 0.41 cm. There was mild mitral regurgitation and moderate to severe tricuspid regurgitation. The patient was treated with diuretics and transferred to [facility] for further management. He was seen in consultation by Dr. Burt Knack and repeat echocardiogram confirmed the presence of severe aortic stenosis with peak velocity across the aortic valve measured 4.1 m/s corresponding to mean transvalvular gradient estimated 36 mmHg. The DVI was 0.16 and aortic valve area calculated 0.41 cm.  There was severe left  ventricular systolic dysfunction with ejection fraction estimated 25-30%. There was mild to moderate aortic insufficiency, mild mitral regurgitation, mild tricuspid regurgitation, and severe pulmonary hypertension.  Left and right heart catheterization was performed 07/10/2017. This revealed severe single-vessel coronary artery disease with long segment 90% stenosis of the mid left anterior descending coronary artery and there was otherwise mild nonobstructive coronary artery disease. There was severe pulmonary hypertension.  The following day the patient underwent right thoracentesis yielding 2 L of serous fluid.  The patient has been seen in consultation by Dr. Cyndia Strickland and felt to be high risk for conventional surgical aortic valve replacement and coronary artery bypass grafting. The patient was seen in consultation by Dr. Enrique Sack and underwent dental extraction on 07/15/2017. CT angiography has been performed and a second surgical opinion has been requested.  The patient is single and lives alone in [place]. He has a friend name [name] who lives close by and checks on him regularly here at reportedly the patient still drives an automobile although his mobility is limited. He walks using a cane. He has a remote history of tobacco use. He lives a sedentary lifestyle. He gets short of breath with activity but reports that he is currently feeling much better than he was at the time of his presentation. He has had chronic dyspnea with activity and severe lower extremity edema. He has had orthopnea. He denies any history of chest pain or chest tightness although he had some epigastric discomfort at the time of presentation. He has never had any dizzy spells or syncope.   Past Medical History:  Diagnosis Date  . Aortic stenosis, severe   . CAD (coronary artery disease)   . Chronic systolic  CHF (congestive heart failure) ([place])   . COPD (chronic obstructive pulmonary disease) ([place])   .  Hypertension     Past Surgical History:  Procedure Laterality Date  . IR THORACENTESIS ASP PLEURAL SPACE W/IMG GUIDE  07/11/2017  . MULTIPLE EXTRACTIONS WITH ALVEOLOPLASTY N/A 07/15/2017   Procedure: Extraction of tooth #'s 4,6,7,8,9,15, 20,21,22, 23 ,24,26,and 27 with alveoloplassty;  Surgeon: Lenn Cal, DDS;  Location: [place];  Service: Oral Surgery;  Laterality: N/A;    Family History  Problem Relation Age of Onset  . Congestive Heart Failure Paternal Uncle     Social History   Social History  . Marital status: Single    Spouse name: N/A  . Number of children: N/A  . Years of education: N/A   Occupational History  . Not on file.   Social History Main Topics  . Smoking status: Former Research scientist (life sciences)  . Smokeless tobacco: Never Used  . Alcohol use Not on file  . Drug use: Unknown  . Sexual activity: Not on file   Other Topics Concern  . Not on file   Social History Narrative  . No narrative on file    Prior to Admission medications   Medication Sig Start Date End Date Taking? Authorizing Provider  aspirin EC 81 MG tablet Take 81 mg by mouth daily.   Yes [provider]  furosemide (LASIX) 40 MG tablet Take 40 mg by mouth daily. 07/04/17  Yes [provider]  lisinopril (PRINIVIL,ZESTRIL) 10 MG tablet Take 10 mg by mouth daily. 06/06/17  Yes [provider]  metoprolol tartrate (LOPRESSOR) 25 MG tablet Take 12.5 mg by mouth 2 (two) times daily.  06/19/17  Yes [provider]    Current Facility-Administered Medications  Medication Dose Route Frequency Provider Last Rate Last Dose  . 0.9 %  sodium chloride infusion  250 mL Intravenous PRN Edward Mocha, MD      . acetaminophen (TYLENOL) tablet 650 mg  650 mg Oral Q6H PRN Danford, Suann Larry, MD       Or  . acetaminophen (TYLENOL) suppository 650 mg  650 mg Rectal Q6H PRN Danford, Suann Larry, MD      . aminocaproic acid (AMICAR) oral solution 50 mg/mL (5%), 100 ml  10 mL Oral Q1H  PRN Teena Dunk F, DDS      . aminocaproic acid (AMICAR) oral solution 50 mg/mL (5%), 100 ml  5 mL Oral Q4H while awake Angelena Form R, PA-C   5 mL at 07/16/17 1800  . aspirin EC tablet 81 mg  81 mg Oral Daily Edwin Dada, MD   81 mg at 07/16/17 0951  . atorvastatin (LIPITOR) tablet 80 mg  80 mg Oral q1800 Eileen Stanford, PA-C   80 mg at 07/16/17 1709  . bacitracin ointment 1 application  1 application Topical I6E Lenn Cal, DDS   1 application at 70/35/00 1440  . [START ON 07/17/2017] clopidogrel (PLAVIX) tablet 75 mg  75 mg Oral Daily Edward Mocha, MD      . feeding supplement (ENSURE ENLIVE) (ENSURE ENLIVE) liquid 237 mL  237 mL Oral BID BM Edward Mocha, MD      . Derrill Memo ON 07/18/2017] furosemide (LASIX) tablet 40 mg  40 mg Oral Daily Eileen Stanford, PA-C      . heparin injection 5,000 Units  5,000 Units Subcutaneous Q8H Eileen Stanford, PA-C   5,000 Units at 07/16/17 1440  . lactated ringers infusion   Intravenous Continuous Kulinski,  Pete Glatter, DDS 10 mL/hr at 07/15/17 1451    . lidocaine (PF) (XYLOCAINE) 1 % injection    PRN Ardis Rowan, PA-C   10 mL at 07/11/17 1402  . ondansetron (ZOFRAN) tablet 4 mg  4 mg Oral Q6H PRN Danford, Suann Larry, MD       Or  . ondansetron (ZOFRAN) injection 4 mg  4 mg Intravenous Q6H PRN Danford, Suann Larry, MD      . oxyCODONE-acetaminophen (PERCOCET/ROXICET) 5-325 MG per tablet 1-2 tablet  1-2 tablet Oral Q6H PRN Lenn Cal, DDS      . [START ON 07/17/2017] potassium chloride SA (K-DUR,KLOR-CON) CR tablet 20 mEq  20 mEq Oral Daily Angelena Form R, PA-C      . sodium chloride flush (NS) 0.9 % injection 3 mL  3 mL Intravenous Q12H Edward Mocha, MD   3 mL at 07/16/17 1000  . sodium chloride flush (NS) 0.9 % injection 3 mL  3 mL Intravenous PRN Edward Mocha, MD        No Known Allergies    Review of Systems:   General:  poor appetite, decreased energy, no weight gain, no weight  loss, no fever  Cardiac:  no chest pain with exertion, no chest pain at rest, + SOB with exertion, + resting SOB, no PND, + orthopnea, no palpitations, no arrhythmia, no atrial fibrillation, + severe LE edema, no dizzy spells, no syncope  Respiratory:  + shortness of breath, no home oxygen, no productive cough, no dry cough, no bronchitis, no wheezing, no hemoptysis, no asthma, no pain with inspiration or cough, no sleep apnea, no CPAP at night  GI:   no difficulty swallowing, no reflux, no frequent heartburn, no hiatal hernia, no abdominal pain, no constipation, no diarrhea, no hematochezia, no hematemesis, no melena  GU:   no dysuria,  no frequency, no urinary tract infection, no hematuria, no enlarged prostate, no kidney stones, no kidney disease  Vascular:  no pain suggestive of claudication, no pain in feet, no leg cramps, no varicose veins, no DVT, no non-healing foot ulcer  Neuro:   no stroke, no TIA's, no seizures, no headaches, no temporary blindness one eye,  no slurred speech, no peripheral neuropathy, no chronic pain, + instability of gait, + significant memory/cognitive dysfunction  Musculoskeletal: + arthritis , no joint swelling, no myalgias, + difficulty walking, limited mobility   Skin:   no rash, no itching, no skin infections, no pressure sores or ulcerations  Psych:   no anxiety, no depression, no nervousness, no unusual recent stress  Eyes:   no blurry vision, no floaters, no recent vision changes, does not wears glasses or contacts  ENT:   no hearing loss  Hematologic:  no easy bruising, no abnormal bleeding, no clotting disorder, no frequent epistaxis  Endocrine:  no diabetes, does not check CBG's at home     Physical Exam:   BP (!) 90/50 (BP Location: Right Arm)   Pulse 76   Temp 98 F (36.7 C) (Oral)   Resp 18   Ht 5\' 6"  (1.676 m)   Wt 141 lb 12.8 oz (64.3 kg)   SpO2 96%   BMI 22.89 kg/m   General:  Thin and frail-appearing, limited understanding re his health  problems  HEENT:  Unremarkable   Neck:   no JVD, no bruits, no adenopathy   Chest:   clear to auscultation, symmetrical breath sounds, no wheezes, no rhonchi   CV:   RRR, grade III/VI  late peaking systolic murmur   Abdomen:  soft, non-tender, no masses   Extremities:  warm, well-perfused, pulses diminished, + lower extremity edema  Rectal/GU  Deferred  Neuro:   Grossly non-focal and symmetrical throughout  Skin:   Clean and dry, no rashes, no breakdown  Diagnostic Tests:  Transthoracic Echocardiography  Patient:    Demitrious, Mccannon MR #:       [id number] Study Date: 07/09/2017 Gender:     M Age:        18 Height:     167.6 cm Weight:     68.4 kg BSA:        1.79 m^2 Pt. Status: Room:       3E12C   ADMITTING    Waldemar Dickens  ATTENDING    Waldemar Dickens  PERFORMING   [facility], Inpatient  SONOGRAPHER  Alvino Chapel, RCS  ORDERING     [place] Silos, Katie  Sharl Ma, Katie  cc:  ------------------------------------------------------------------- LV EF: 25% -   30%  ------------------------------------------------------------------- Indications:      Aortic stenosis 424.1.  ------------------------------------------------------------------- History:   PMH:  Acquired from the patient and from the patient&'s chart.  Congestive heart failure.  Chronic obstructive pulmonary disease.  Risk factors:  Hypertension.  ------------------------------------------------------------------- Study Conclusions  - Left ventricle: The cavity size was mildly dilated. Wall   thickness was increased in a pattern of mild LVH. Systolic   function was severely reduced. The estimated ejection fraction   was in the range of 25% to 30%. Diffuse hypokinesis. - Aortic valve: Valve mobility was restricted. There was severe   stenosis. There was mild to moderate regurgitation. Valve area   (VTI): 0.41 cm^2. Valve area (Vmax): 0.35 cm^2. Valve area   (Vmean): 0.39 cm^2. - Left atrium:  The atrium was moderately dilated. - Right ventricle: The cavity size was moderately dilated. Systolic   function was moderately reduced. - Right atrium: The atrium was moderately dilated. - Pulmonary arteries: Systolic pressure was severely increased. PA   peak pressure: 79 mm Hg (S). - Pericardium, extracardiac: There was a left pleural effusion.  Impressions:  - Severe global reduction in LV systolic function; mild LVE and   LVH; calcified aortic valve with severe AS and mild to moderate   AI; moderate LAE; moderate RVE with moderately reduced function;   mild TR with severely elevated pulmonary pressure.  ------------------------------------------------------------------- Study data:  No prior study was available for comparison.  Study status:  Routine.  Procedure:  The patient reported no pain pre or post test. Transthoracic echocardiography. Image quality was adequate.  Study completion:  There were no complications. Transthoracic echocardiography.  M-mode, complete 2D, spectral Doppler, and color Doppler.  Birthdate:  Patient birthdate: 30-Jun-1941.  Age:  Patient is 76 yr old.  Sex:  Gender: male. BMI: 24.4 kg/m^2.  Blood pressure:     93/52  Patient status: Inpatient.  Study date:  Study date: 07/09/2017. Study time: 03:37 PM.  Location:  Bedside.  -------------------------------------------------------------------  ------------------------------------------------------------------- Left ventricle:  The cavity size was mildly dilated. Wall thickness was increased in a pattern of mild LVH. Systolic function was severely reduced. The estimated ejection fraction was in the range of 25% to 30%. Diffuse hypokinesis.  ------------------------------------------------------------------- Aortic valve:   Severely calcified leaflets. Valve mobility was restricted.  Doppler:   There was severe stenosis.   There was mild to moderate regurgitation.    VTI ratio of LVOT to aortic  valve: 0.18. Valve area (VTI):  0.41 cm^2. Indexed valve area (VTI): 0.23 cm^2/m^2. Peak velocity ratio of LVOT to aortic valve: 0.16. Valve area (Vmax): 0.35 cm^2. Indexed valve area (Vmax): 0.2 cm^2/m^2. Mean velocity ratio of LVOT to aortic valve: 0.17. Valve area (Vmean): 0.39 cm^2. Indexed valve area (Vmean): 0.22 cm^2/m^2. Mean gradient (S): 36 mm Hg. Peak gradient (S): 66 mm Hg.  ------------------------------------------------------------------- Aorta:  Aortic root: The aortic root was normal in size.  ------------------------------------------------------------------- Mitral valve:   Structurally normal valve.   Mobility was not restricted.  Doppler:  Transvalvular velocity was within the normal range. There was no evidence for stenosis. There was trivial regurgitation.    Indexed valve area by pressure half-time: 1.23 cm^2/m^2.    Peak gradient (D): 5 mm Hg.  ------------------------------------------------------------------- Left atrium:  The atrium was moderately dilated.  ------------------------------------------------------------------- Right ventricle:  The cavity size was moderately dilated. Systolic function was moderately reduced.  ------------------------------------------------------------------- Pulmonic valve:    Doppler:  Transvalvular velocity was within the normal range. There was no evidence for stenosis. There was trivial regurgitation.  ------------------------------------------------------------------- Tricuspid valve:   Structurally normal valve.    Doppler: Transvalvular velocity was within the normal range. There was mild regurgitation.  ------------------------------------------------------------------- Pulmonary artery:   Systolic pressure was severely increased.  ------------------------------------------------------------------- Right atrium:  The atrium was moderately  dilated.  ------------------------------------------------------------------- Pericardium:  There was no pericardial effusion.  ------------------------------------------------------------------- Systemic veins: Inferior vena cava: The vessel was normal in size.  ------------------------------------------------------------------- Pleura:  There was a left pleural effusion.  ------------------------------------------------------------------- Measurements   Left ventricle                           Value          Reference  LV ID, ED, PLAX chordal          (H)     53    mm       43 - 52  LV ID, ES, PLAX chordal          (H)     47    mm       23 - 38  LV fx shortening, PLAX chordal   (L)     11    %        >=29  LV PW thickness, ED                      12    mm       ----------  IVS/LV PW ratio, ED                      0.92           <=1.3  Stroke volume, 2D                        35    ml       ----------  Stroke volume/bsa, 2D                    20    ml/m^2   ----------  LV ejection fraction, 1-p A4C            42    %        ----------  LV end-diastolic volume, 2-p             184   ml       ----------  LV end-systolic volume, 2-p              121   ml       ----------  LV ejection fraction, 2-p                34    %        ----------  Stroke volume, 2-p                       63    ml       ----------  LV end-diastolic volume/bsa, 2-p         103   ml/m^2   ----------  LV end-systolic volume/bsa, 2-p          67    ml/m^2   ----------  Stroke volume/bsa, 2-p                   35.1  ml/m^2   ----------    Ventricular septum                       Value          Reference  IVS thickness, ED                        11    mm       ----------    LVOT                                     Value          Reference  LVOT ID, S                               17    mm       ----------  LVOT area                                2.27  cm^2     ----------  LVOT peak velocity, S                     63.4  cm/s     ----------  LVOT mean velocity, S                    47    cm/s     ----------  LVOT VTI, S                              15.5  cm       ----------  LVOT peak gradient, S                    2     mm Hg    ----------    Aortic valve                             Value          Reference  Aortic valve peak velocity, S            406   cm/s     ----------  Aortic valve mean velocity, S            274   cm/s     ----------  Aortic valve VTI, S                      86.2  cm       ----------  Aortic mean gradient, S                  36    mm Hg    ----------  Aortic peak gradient, S                  66    mm Hg    ----------  VTI ratio, LVOT/AV                       0.18           ----------  Aortic valve area, VTI                   0.41  cm^2     ----------  Aortic valve area/bsa, VTI               0.23  cm^2/m^2 ----------  Velocity ratio, peak, LVOT/AV            0.16           ----------  Aortic valve area, peak velocity         0.35  cm^2     ----------  Aortic valve area/bsa, peak              0.2   cm^2/m^2 ----------  velocity  Velocity ratio, mean, LVOT/AV            0.17           ----------  Aortic valve area, mean velocity         0.39  cm^2     ----------  Aortic valve area/bsa, mean              0.22  cm^2/m^2 ----------  velocity  Aortic regurg pressure half-time         171   ms       ----------    Aorta                                    Value          Reference  Aortic root ID, ED                       30    mm       ----------    Left atrium                              Value          Reference  LA ID, A-P, ES                           42    mm       ----------  LA ID/bsa, A-P                   (H)     2.34  cm/m^2   <=2.2  LA volume, S                             82.6  ml       ----------  LA volume/bsa, S                         46.1  ml/m^2   ----------  LA volume, ES, 1-p A4C                   77.9  ml       ----------  LA volume/bsa, ES, 1-p A4C                43.4  ml/m^2   ----------  LA volume, ES, 1-p A2C                   80.8  ml       ----------  LA volume/bsa, ES, 1-p A2C               45    ml/m^2   ----------    Mitral valve                             Value          Reference  Mitral E-wave peak velocity              111   cm/s     ----------  Mitral A-wave peak velocity              42.2  cm/s     ----------  Mitral deceleration time         (L)     106   ms       150 - 230  Mitral pressure half-time                31    ms       ----------  Mitral peak gradient, D                  5     mm Hg    ----------  Mitral E/A ratio, peak                   2.6            ----------  Mitral valve area/bsa, PHT, DP           1.23  cm^2/m^2 ----------    Pulmonary arteries                       Value          Reference  PA pressure, S, DP               (H)     79    mm Hg    <=30    Tricuspid valve                          Value          Reference  Tricuspid regurg peak velocity           400   cm/s     ----------  Tricuspid peak RV-RA gradient            64  mm Hg    ----------    Right atrium                             Value          Reference  RA ID, S-I, ES, A4C              (H)     54.7  mm       34 - 49  RA area, ES, A4C                 (H)     22    cm^2     8.3 - 19.5  RA volume, ES, A/L                       76.2  ml       ----------  RA volume/bsa, ES, A/L                   42.5  ml/m^2   ----------    Systemic veins                           Value          Reference  Estimated CVP                            15    mm Hg    ----------    Right ventricle                          Value          Reference  RV ID, ED, PLAX                          37    mm       19 - 38  TAPSE                                    13.5  mm       ----------  RV pressure, S, DP               (H)     79    mm Hg    <=30  RV s&', lateral, S                        9.73  cm/s     ----------  Legend: (L)  and  (H)  mark values outside  specified reference range.  ------------------------------------------------------------------- Prepared and Electronically Authenticated by  Kirk Ruths 2018-09-11T16:49:04   RIGHT HEART CATH AND CORONARY ANGIOGRAPHY  Conclusion   1. Severe single-vessel coronary artery disease with severe stenosis in the mid LAD and otherwise mild diffuse nonobstructive calcific coronary disease 2. Known severe aortic stenosis with very heavy calcification of the aortic valve on plain fluoroscopy and severely restricted aortic leaflet mobility 3. Severe pulmonary hypertension likely secondary to left heart disease with severely elevated pulmonary capillary wedge pressure  Recommendation: Continued evaluation by the multidisciplinary heart valve team. Likely proceed with PCI of the LAD followed by TAVR pending cardiac surgical consultation  Indications  Severe aortic stenosis [I35.0 (ICD-10-CM)]  Procedural Details/Technique   Technical Details INDICATION: Severe aortic stenosis/Acute systolic heart failure  PROCEDURAL DETAILS: There was an indwelling IV in a right antecubital vein. Using normal sterile technique, the IV was changed out for a 5 Fr brachial sheath over a 0.018 inch wire. The right wrist was then prepped, draped, and anesthetized with 1% lidocaine. Using the modified Seldinger technique a 5/6 French Slender sheath was placed in the right radial artery. Intra-arterial verapamil was administered through the radial artery sheath. IV heparin was administered after a JR4 catheter was advanced into the central aorta. A Swan-Ganz catheter was used for the right heart catheterization. Standard protocol was followed for recording of right heart pressures and sampling of oxygen saturations. Fick cardiac output was calculated. Standard Judkins catheters were used for selective coronary angiography. There were no immediate procedural complications. The patient was transferred to the post  catheterization recovery area for further monitoring.  Note the patient's respiratory status was marginal after receiving low doses of Versed and fentanyl. He required a nonrebreather through much of the procedure. At the completion of the procedure he had an oxygen saturation greater than 90% on oxygen per nasal cannula and he was awake and alert.     Estimated blood loss <50 mL.  During this procedure the patient was administered the following to achieve and maintain moderate conscious sedation: Versed 1 mg, Fentanyl 25 mcg, while the patient's heart rate, blood pressure, and oxygen saturation were continuously monitored. The period of conscious sedation was 31 minutes, of which I was present face-to-face 100% of this time.    Coronary Findings   Dominance: Right  Left Anterior Descending  Prox LAD to Mid LAD lesion, 90% stenosed. The lesion is calcified. There is a long lesion in the mid LAD with the most severe portion of the lesion approaching 90% stenosis  First Diagonal Branch  Vessel is large in size. The first diagonal is a large branch with no stenosis  Left Circumflex  First Obtuse Marginal Branch  Ost 1st Mrg to 1st Mrg lesion, 40% stenosed.  Right Coronary Artery  There is mild the vessel.  Mid RCA lesion, 50% stenosed. The lesion is calcified.  Right Heart   Right Heart Pressures Hemodynamic findings consistent with severe pulmonary hypertension.    Left Heart   Aortic Valve There is severe aortic valve stenosis. The aortic valve is calcified. There is restricted aortic valve motion.    Coronary Diagrams   Diagnostic Diagram       Implants     No implant documentation for this case.  PACS Images   Show images for CARDIAC CATHETERIZATION   Link to Procedure Log   Procedure Log    Hemo Data    Most Recent Value  Fick Cardiac Output 4.42 L/min  Fick Cardiac Output Index 2.48 (L/min)/BSA  RA A Wave 19 mmHg  RA V Wave 13 mmHg  RA Mean 11 mmHg  RV  Systolic Pressure 82 mmHg  RV Diastolic Pressure 12 mmHg  RV EDP 22 mmHg  PA Systolic Pressure 79 mmHg  PA Diastolic Pressure 39 mmHg  PA Mean 52 mmHg  PW A Wave 40 mmHg  PW V Wave 42 mmHg  PW Mean 36 mmHg  AO Systolic Pressure 683 mmHg  AO Diastolic Pressure 48 mmHg  AO Mean 72 mmHg  QP/QS 0.67  TPVR Index 31.07 HRUI  TSVR Index 28.99 HRUI  PVR SVR Ratio 0.39  TPVR/TSVR Ratio 1.07  Cardiac TAVR CT  TECHNIQUE: The patient was scanned on a [employer]. A 120 kV retrospective scan was triggered in the descending thoracic aorta at 111 HU's. Gantry rotation speed was 250 msecs and collimation was .6 mm. No beta blockade or nitro were given. The 3D data set was reconstructed in 5% intervals of the R-R cycle. Systolic and diastolic phases were analyzed on a dedicated work station using MPR, MIP and VRT modes. The patient received 80 cc of contrast.  FINDINGS: Aortic Valve: Trileaflet aortic valve with heavily calcified leaflets and severe leaflet opening restriction. There are significant asymmetric calcifications extending into the LVOT under the non-coronary leaflet.  Aorta:  Normal size, mild diffuse calcifications, no dissection.  Sinotubular Junction:  31 x 28 mm  Ascending Thoracic Aorta:  36 x 35 mm  Aortic Arch:  24 x 24 mm  Descending Thoracic Aorta:  24 x 24 mm  Sinus of Valsalva Measurements:  Non-coronary:  35 mm  Right -coronary:  33 mm  Left -coronary:  34 mm  Coronary Artery Height above Annulus:  Left Main:  17 mm  Right Coronary:  18 mm  Virtual Basal Annulus Measurements:  Maximum/Minimum Diameter:  31.6 x 23.4 mm  Perimeter:  89.2 mm  Area:  601 mm2  Coronary Arteries:  Normal origin.  Optimum Fluoroscopic Angle for Delivery:  LAU 7 CAU 7  IMPRESSION: 1. Trileaflet aortic valve with heavily calcified leaflets and severe leaflet opening restriction. There are significant asymmetric calcifications  extending into the LVOT under the non-coronary leaflet. Annular measurements suitable for delivery of a 29 mm Edwards-SAPIEN valve.  2. Sufficient annulus to coronary distance.  3. Optimum Fluoroscopic Angle for Delivery:  LAU 7 CAU 7.  4. No Thrombus in the left atrial appendage.  5. Dilated pulmonary artery measuring 33 x 32 mm suggestive of pulmonary hypertension.  Ena Dawley  Electronically Signed: By: Ena Dawley On: 07/12/2017 18:17   CT ANGIOGRAPHY CHEST, ABDOMEN AND PELVIS  TECHNIQUE: Multidetector CT imaging through the chest, abdomen and pelvis was performed using the standard protocol during bolus administration of intravenous contrast. Multiplanar reconstructed images and MIPs were obtained and reviewed to evaluate the vascular anatomy.  CONTRAST:  100 mL of Isovue 370.  COMPARISON:  None.  FINDINGS: CTA CHEST FINDINGS  Cardiovascular: Cardiomegaly. There is no significant pericardial fluid, thickening or pericardial calcification. There is aortic atherosclerosis, as well as atherosclerosis of the great vessels of the mediastinum and the coronary arteries, including calcified atherosclerotic plaque in the left anterior descending coronary artery. Severe thickening calcification of the aortic valve.  Mediastinum/Lymph Nodes: Multiple prominent borderline enlarged mediastinal and bilateral hilar lymph nodes are noted measuring up to 9 mm in short axis (nonspecific). Esophagus is unremarkable in appearance. No axillary lymphadenopathy.  Lungs/Pleura: Patchy areas of ground-glass attenuation and septal thickening are noted throughout the lungs bilaterally, presumably related to a background of moderate pulmonary edema. Moderate right and small to moderate left pleural effusions lie dependently with associated areas of passive subsegmental atelectasis in the dependent lung bases. No definite suspicious appearing pulmonary nodules or  masses are noted.  Musculoskeletal/Soft Tissues: There are no aggressive appearing lytic or blastic lesions noted in the visualized portions of the skeleton.  CTA ABDOMEN AND PELVIS FINDINGS  Hepatobiliary: No cystic or solid hepatic lesions. No intra or extrahepatic biliary ductal dilatation. Gallbladder is unremarkable in appearance.  Pancreas: No pancreatic mass. No pancreatic ductal dilatation. No pancreatic or peripancreatic fluid or inflammatory changes.  Spleen:  Unremarkable.  Adrenals/Urinary Tract: Subcentimeter low-attenuation lesion in the right kidney is too small to definitively characterize, but is statistically likely a cyst. Left kidney and bilateral adrenal glands are normal in appearance. No hydroureteronephrosis. Urinary bladder is normal in appearance.  Stomach/Bowel: Normal appearance of the stomach. No pathologic dilatation of small bowel or colon. A few scattered colonic diverticulae are noted, without surrounding inflammatory changes to suggest an acute diverticulitis at this time. Normal appendix.  Vascular/Lymphatic: Aortic atherosclerosis with vascular findings and measurements pertinent to potential TAVR procedure, as detailed below. No aneurysm or dissection noted in the abdominal or pelvic vasculature. Celiac axis, superior mesenteric artery and inferior mesenteric artery are all widely patent without hemodynamically significant stenosis. Celiac axis is ectatic measuring up to 9 mm in diameter. Two right and 2 left renal arteries are all widely patent without hemodynamically significant stenosis. No lymphadenopathy noted in the abdomen or pelvis.  Reproductive: Prostate gland and seminal vesicles are unremarkable in appearance.  Other: No significant volume of ascites.  No pneumoperitoneum.  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 There are no aggressive appearing lytic or blastic lesions noted in the visualized portions of the skeleton.  VASCULAR MEASUREMENTS PERTINENT TO TAVR:  AORTA:  Minimal Aortic Diameter -  11 x 9 mm  Severity of Aortic Calcification -  moderate to severe  RIGHT PELVIS:  Right Common Iliac Artery -  Minimal Diameter - 7.9 x 6.7 mm  Tortuosity - mild  Calcification - mild  Right External Iliac Artery -  Minimal Diameter - 7.7 x 8.9 mm  Tortuosity - mild  Calcification - none  Right Common Femoral Artery -  Minimal Diameter - 7.6 x 6.3 mm  Tortuosity - mild  Calcification - mild to moderate  LEFT PELVIS:  Left Common Iliac Artery -  Minimal Diameter - 6.5 x 5.9 mm  Tortuosity - mild  Calcification - mild to moderate  Left External Iliac Artery -  Minimal Diameter - 7.0 x 5.9 mm  Tortuosity - mild  Calcification - none  Left Common Femoral Artery -  Minimal Diameter - 7.7 x 5.7 mm  Tortuosity - mild  Calcification - mild to moderate  Review of the MIP images confirms the above findings.  IMPRESSION: 1. Vascular findings and measurements pertinent to potential TAVR procedure, as detailed above. This patient does have suitable pelvic arterial access bilaterally. 2. Severe thickening calcification of the aortic valve, compatible with the reported clinical history of severe aortic stenosis. 3. Findings in the thorax suggestive of underlying congestive heart failure, including moderate pulmonary edema, moderate right and small a moderate left pleural effusions, and cardiomegaly. 4. Aortic atherosclerosis, in addition to left anterior descending coronary artery disease. Assessment for potential risk factor modification, dietary therapy or pharmacologic therapy may be warranted, if clinically indicated. 5. Colonic diverticulosis without evidence to suggest an acute diverticulitis at this time. 6.  Additional incidental findings, as above. Aortic Atherosclerosis (ICD10-I70.0).   Electronically Signed   By: Vinnie Langton M.D.   On: 07/16/2017 06:55   Impression:  Patient has stage D severe symptomatic aortic stenosis and single-vessel coronary artery disease. He presents with acute exacerbation of chronic combined systolic and diastolic congestive heart failure. At the time of his presentation he had class IV symptoms with resting shortness of breath and hypoxemia. He is currently doing much better following diuretic therapy and drainage of his right pleural effusion. I  personally reviewed the patient's transthoracic echocardiogram, diagnostic cardiac catheterization, and CT angiograms. Echocardiogram reveals severe calcification and thickening of all 3 leaflets of the patient's aortic valve with extremely severely limited mobility. There is severe left ventricular systolic dysfunction and despite this peak velocity across the aortic valve still measured greater than 4 m/s, consistent with critical aortic stenosis. Diagnostic cardiac catheterization reveals long segment 90% stenosis of left anterior descending coronary artery with otherwise mild nonobstructive coronary artery disease. There was severe pulmonary hypertension. I agree the patient needs aortic valve replacement and coronary revascularization. Risks associated with conventional surgery would unquestionably be very high, and I would be very reluctant to consider this patient a candidate for conventional surgery given his age, severe physical deconditioning and decompensated heart failure.  Cardiac-gated CTA of the heart reveals anatomical characteristics consistent with aortic stenosis suitable for treatment by transcatheter aortic valve replacement without any significant complicating features, although the patient may be at somewhat increased risk for paravalvular leak due to the presence of significant calcification in the annulus  beneath the noncoronary leaflet which extends into the aorto-mitral curtain.  CTA of the aorta and iliac vessels demonstrate what appears to be adequate pelvic vascular access to facilitate a transfemoral approach.   Plan:  The patient and his friend [name] were counseled at length regarding treatment alternatives for management of severe symptomatic aortic stenosis. Alternative approaches such as conventional aortic valve replacement, transcatheter aortic valve replacement, and palliative medical therapy were compared and contrasted at length.  The risks associated with conventional surgical aortic valve replacement were been discussed in detail, as were expectations for post-operative convalescence, and why I would be reluctant to consider this patient a candidate for conventional surgery.  Issues specific to transcatheter aortic valve replacement were discussed including questions about long term valve durability, the potential for paravalvular leak, possible increased risk of need for permanent pacemaker placement, and other technical complications related to the procedure itself.  Long-term prognosis with medical therapy was discussed. This discussion was placed in the context of the patient's own specific clinical presentation and past medical history.  All of their questions been addressed.  I agree with plans to proceed with PCI of the left anterior descending coronary artery followed by transcatheter aortic valve replacement early next week. All questions answered.   I spent in excess of 90 minutes during the conduct of this hospital consultation and >50% of this time involved direct face-to-face encounter for counseling and/or coordination of the patient's care.    Valentina Gu. Roxy Manns, MD 07/16/2017 6:29 PM

## 2017-07-16 NOTE — Progress Notes (Signed)
PreTAVR Assessment   Pt on RA utilizing a RW throughout.  6 Minute Walk Test   Distance - 562 ft (greater than age norm distance)      Pre    Post  BP     91/45    135/61  HR    79 bpm   106 bpm  SaO2 On RA   97%O2   96%O2  Modified Borg Dyspnea 0    5  RPE    0    11    5 Meter Walk Test  Trial   1) 9.90 sec   2) 10.06 sec  3) 7.99 sec  Average - 9.31 sec = 0.57 ft/sec   Clinical Frailty Scale - 4  Edward Strickland B. Migdalia Dk PT, DPT Acute Rehabilitation  [phone] Pager [phone]

## 2017-07-17 ENCOUNTER — Encounter (HOSPITAL_COMMUNITY): Payer: Self-pay | Admitting: Cardiovascular Disease

## 2017-07-17 ENCOUNTER — Encounter (HOSPITAL_COMMUNITY)
Admission: AD | Disposition: A | Discharge status: Skilled Nursing Facility | Payer: Self-pay | Source: Other Acute Inpatient Hospital | Attending: Cardiovascular Disease

## 2017-07-17 DIAGNOSIS — I25110 Atherosclerotic heart disease of native coronary artery with unstable angina pectoris: Secondary | ICD-10-CM | POA: Diagnosis present

## 2017-07-17 HISTORY — PX: CORONARY STENT INTERVENTION: CATH118234

## 2017-07-17 LAB — BASIC METABOLIC PANEL
ANION GAP: 7 (ref 5–15)
BUN: 27 mg/dL — ABNORMAL HIGH (ref 6–20)
CHLORIDE: 99 mmol/L — AB (ref 101–111)
CO2: 28 mmol/L (ref 22–32)
Calcium: 8.6 mg/dL — ABNORMAL LOW (ref 8.9–10.3)
Creatinine, Ser: 1.25 mg/dL — ABNORMAL HIGH (ref 0.61–1.24)
GFR calc Af Amer: >60 mL/min (ref >60)
GFR, EST NON AFRICAN AMERICAN: 54 mL/min — AB (ref >60)
GLUCOSE: 93 mg/dL (ref 65–99)
POTASSIUM: 4.1 mmol/L (ref 3.5–5.1)
Sodium: 134 mmol/L — ABNORMAL LOW (ref 135–145)

## 2017-07-17 LAB — PROTIME-INR
INR: 1.16
Prothrombin Time: 14.7 seconds (ref 11.4–15.2)

## 2017-07-17 LAB — POCT ACTIVATED CLOTTING TIME: Activated Clotting Time: 527 seconds

## 2017-07-17 SURGERY — CORONARY STENT INTERVENTION
Anesthesia: LOCAL

## 2017-07-17 MED ORDER — NITROGLYCERIN 1 MG/10 ML FOR IR/CATH LAB
INTRA_ARTERIAL | Status: AC
  Filled 2017-07-17: qty 10

## 2017-07-17 MED ORDER — MIDAZOLAM HCL 2 MG/2ML IJ SOLN
INTRAMUSCULAR | Status: DC | PRN
  Administered 2017-07-17: 1 mg via INTRAVENOUS

## 2017-07-17 MED ORDER — VERAPAMIL HCL 2.5 MG/ML IV SOLN
INTRAVENOUS | Status: DC | PRN
  Administered 2017-07-17: 10 mL via INTRA_ARTERIAL

## 2017-07-17 MED ORDER — LIDOCAINE HCL 2 % IJ SOLN
INTRAMUSCULAR | Status: AC
  Filled 2017-07-17: qty 10

## 2017-07-17 MED ORDER — FENTANYL CITRATE (PF) 100 MCG/2ML IJ SOLN
INTRAMUSCULAR | Status: AC
  Filled 2017-07-17: qty 2

## 2017-07-17 MED ORDER — SODIUM CHLORIDE 0.9% FLUSH: 3.0000 mL | INTRAVENOUS | Status: DC | PRN

## 2017-07-17 MED ORDER — HEPARIN (PORCINE) IN NACL 2-0.9 UNIT/ML-% IJ SOLN
INTRAMUSCULAR | Status: AC
  Filled 2017-07-17: qty 1000

## 2017-07-17 MED ORDER — HEPARIN SODIUM (PORCINE) 1000 UNIT/ML IJ SOLN
INTRAMUSCULAR | Status: AC
  Filled 2017-07-17: qty 1

## 2017-07-17 MED ORDER — HEPARIN SODIUM (PORCINE) 5000 UNIT/ML IJ SOLN
5000.0000 [IU] | Freq: Three times a day (TID) | INTRAMUSCULAR | Status: DC
  Administered 2017-07-18 – 2017-07-20 (×6): 5000 [IU] via SUBCUTANEOUS
  Filled 2017-07-17 (×5): qty 1

## 2017-07-17 MED ORDER — IOPAMIDOL (ISOVUE-370) INJECTION 76%
INTRAVENOUS | Status: AC
  Filled 2017-07-17: qty 125

## 2017-07-17 MED ORDER — SODIUM CHLORIDE 0.9 % IV SOLN
INTRAVENOUS | Status: DC
  Administered 2017-07-17: 06:00:00 via INTRAVENOUS

## 2017-07-17 MED ORDER — MIDAZOLAM HCL 2 MG/2ML IJ SOLN
INTRAMUSCULAR | Status: AC
  Filled 2017-07-17: qty 2

## 2017-07-17 MED ORDER — SODIUM CHLORIDE 0.9 % IV SOLN: INTRAVENOUS | Status: AC

## 2017-07-17 MED ORDER — VERAPAMIL HCL 2.5 MG/ML IV SOLN
INTRAVENOUS | Status: AC
  Filled 2017-07-17: qty 2

## 2017-07-17 MED ORDER — SODIUM CHLORIDE 0.9 % IV SOLN: 250.0000 mL | INTRAVENOUS | Status: DC | PRN

## 2017-07-17 MED ORDER — IOPAMIDOL (ISOVUE-370) INJECTION 76%
INTRAVENOUS | Status: DC | PRN
  Administered 2017-07-17: 120 mL via INTRA_ARTERIAL

## 2017-07-17 MED ORDER — SODIUM CHLORIDE 0.9 % IV SOLN: INTRAVENOUS | Status: DC

## 2017-07-17 MED ORDER — HEPARIN SODIUM (PORCINE) 1000 UNIT/ML IJ SOLN
INTRAMUSCULAR | Status: DC | PRN
  Administered 2017-07-17: 8000 [IU] via INTRAVENOUS

## 2017-07-17 MED ORDER — SODIUM CHLORIDE 0.9% FLUSH
3.0000 mL | Freq: Two times a day (BID) | INTRAVENOUS | Status: DC
  Administered 2017-07-17: 3 mL via INTRAVENOUS

## 2017-07-17 MED ORDER — IOPAMIDOL (ISOVUE-370) INJECTION 76%
INTRAVENOUS | Status: AC
  Filled 2017-07-17: qty 50

## 2017-07-17 MED ORDER — LIDOCAINE HCL (PF) 1 % IJ SOLN
INTRAMUSCULAR | Status: DC | PRN
  Administered 2017-07-17: 2 mL

## 2017-07-17 MED ORDER — FENTANYL CITRATE (PF) 100 MCG/2ML IJ SOLN
INTRAMUSCULAR | Status: DC | PRN
  Administered 2017-07-17: 25 ug via INTRAVENOUS

## 2017-07-17 MED ORDER — HEPARIN (PORCINE) IN NACL 2-0.9 UNIT/ML-% IJ SOLN
INTRAMUSCULAR | Status: AC | PRN
  Administered 2017-07-17: 1000 mL

## 2017-07-17 MED ORDER — SODIUM CHLORIDE 0.9% FLUSH
3.0000 mL | Freq: Two times a day (BID) | INTRAVENOUS | Status: DC
  Administered 2017-07-18 – 2017-07-23 (×8): 3 mL via INTRAVENOUS

## 2017-07-17 SURGICAL SUPPLY — 15 items
BALLN SAPPHIRE 2.0X15 (BALLOONS) ×2
BALLN SAPPHIRE ~~LOC~~ 2.75X18 (BALLOONS) ×2 IMPLANT
BALLOON SAPPHIRE 2.0X15 (BALLOONS) ×1 IMPLANT
CATH VISTA GUIDE 6FR XBLAD3.5 (CATHETERS) ×2 IMPLANT
DEVICE RAD COMP TR BAND LRG (VASCULAR PRODUCTS) ×2 IMPLANT
GLIDESHEATH SLEND SS 6F .021 (SHEATH) ×2 IMPLANT
GUIDEWIRE INQWIRE 1.5J.035X260 (WIRE) ×1 IMPLANT
INQWIRE 1.5J .035X260CM (WIRE) ×2
KIT ENCORE 26 ADVANTAGE (KITS) ×4 IMPLANT
KIT HEART LEFT (KITS) ×2 IMPLANT
PACK CARDIAC CATHETERIZATION (CUSTOM PROCEDURE TRAY) ×2 IMPLANT
STENT SYNERGY DES 2.5X32 (Permanent Stent) ×2 IMPLANT
TRANSDUCER W/STOPCOCK (MISCELLANEOUS) ×2 IMPLANT
TUBING CIL FLEX 10 FLL-RA (TUBING) ×2 IMPLANT
WIRE COUGAR XT STRL 190CM (WIRE) ×2 IMPLANT

## 2017-07-17 NOTE — H&P (View-Only) (Signed)
Patient Name: Edward Strickland Date of Encounter: 07/16/2017  Primary Cardiologist: Dr. Tyrell Antonio Problem List     Principal Problem:   Acute on chronic systolic CHF (congestive heart failure) ([place]) Active Problems:   Aortic stenosis, severe   CKD (chronic kidney disease), stage III   Essential hypertension   COPD (chronic obstructive pulmonary disease) ([facility])   Pleural effusion     Subjective   No complaints. Wonders if his toenail needs to be cut off.   Inpatient Medications    Scheduled Meds: . aminocaproic acid  5 mL Oral Q4H while awake  . aspirin EC  81 mg Oral Daily  . atorvastatin  80 mg Oral q1800  . bacitracin  1 application Topical W9U  . furosemide  40 mg Oral BID  . metoprolol tartrate  12.5 mg Oral BID  . potassium chloride  20 mEq Oral BID  . sodium chloride flush  3 mL Intravenous Q12H   Continuous Infusions: . sodium chloride    . lactated ringers 10 mL/hr at 07/15/17 1451   PRN Meds: sodium chloride, acetaminophen **OR** acetaminophen, aminocaproic acid, lidocaine (PF), ondansetron **OR** ondansetron (ZOFRAN) IV, oxyCODONE-acetaminophen, sodium chloride flush   Vital Signs    Vitals:   07/15/17 1500 07/15/17 1919 07/16/17 0000 07/16/17 0421  BP: (!) 130/50 (!) 99/47 90/69 (!) 90/54  Pulse: 98 90 80 88  Resp:  18  18  Temp:  98.5 F (36.9 C)  97.6 F (36.4 C)  TempSrc:  Axillary  Oral  SpO2: 96% 98%  99%  Weight:    141 lb 12.8 oz (64.3 kg)  Height:        Intake/Output Summary (Last 24 hours) at 07/16/17 0828 Last data filed at 07/16/17 0000  Gross per 24 hour  Intake            931.5 ml  Output             1075 ml  Net           -143.5 ml   Filed Weights   07/14/17 0540 07/15/17 0623 07/16/17 0421  Weight: 143 lb 8 oz (65.1 kg) 138 lb 8 oz (62.8 kg) 141 lb 12.8 oz (64.3 kg)    Physical Exam   GEN: Well nourished, well developed, in no acute distress.  HEENT: Grossly normal.  Neck: Supple, no JVD, carotid upstrokes  delayed with bilateral bruits. no masses. Cardiac: RRR, 3/6 harsh late peaking systolic murmur at RUSB/LLSB. No rubs or gallops. No clubbing, cyanosis. No pretibial edema with skin mottling  Radials/DP/PT 2+ and equal bilaterally.  Respiratory:  Respirations regular and unlabored, diminished breath sounds R> L, mild wheezing GI: Soft, nontender, nondistended, BS + x 4. MS: no deformity or atrophy. Skin: warm and dry, no rash. chronic stasis changes L>R Neuro:  Strength and sensation are intact. Psych: AAOx3.  Normal affect.  Labs    CBC No results for input(s): WBC, NEUTROABS, HGB, HCT, MCV, PLT in the last 72 hours. Basic Metabolic Panel  Recent Labs  07/14/17 0429 07/15/17 0446 07/16/17 0532  NA 138 135 133*  K 4.5 4.5 5.1  CL 101 98* 97*  CO2 31 27 28   GLUCOSE 93 99 108*  BUN 26* 27* 33*  CREATININE 1.13 1.13 1.46*  CALCIUM 8.8* 8.8* 8.5*  MG 2.3  --   --    Liver Function Tests No results for input(s): AST, ALT, ALKPHOS, BILITOT, PROT, ALBUMIN in the last 72 hours. No results  for input(s): LIPASE, AMYLASE in the last 72 hours. Cardiac Enzymes No results for input(s): CKTOTAL, CKMB, CKMBINDEX, TROPONINI in the last 72 hours. BNP Invalid input(s): POCBNP D-Dimer No results for input(s): DDIMER in the last 72 hours. Hemoglobin A1C No results for input(s): HGBA1C in the last 72 hours. Fasting Lipid Panel No results for input(s): CHOL, HDL, LDLCALC, TRIG, CHOLHDL, LDLDIRECT in the last 72 hours. Thyroid Function Tests No results for input(s): TSH, T4TOTAL, T3FREE, THYROIDAB in the last 72 hours.  Invalid input(s): FREET3  Telemetry    NSR - Personally Reviewed  ECG    sinus with 1st deg AV block, LVH with repol abnormality, HR 74 - Personally Reviewed  Radiology    No results found.  Cardiac Studies   2D ECHO: 07/09/2017 LV EF: 25% -   30% Study Conclusion - Left ventricle: The cavity size was mildly dilated. Wall   thickness was increased in a pattern  of mild LVH. Systolic   function was severely reduced. The estimated ejection fraction   was in the range of 25% to 30%. Diffuse hypokinesis. - Aortic valve: Valve mobility was restricted. There was severe   stenosis. There was mild to moderate regurgitation. Valve area   (VTI): 0.41 cm^2. Valve area (Vmax): 0.35 cm^2. Valve area   (Vmean): 0.39 cm^2. - Left atrium: The atrium was moderately dilated. - Right ventricle: The cavity size was moderately dilated. Systolic   function was moderately reduced. - Right atrium: The atrium was moderately dilated. - Pulmonary arteries: Systolic pressure was severely increased. PA   peak pressure: 79 mm Hg (S). - Pericardium, extracardiac: There was a left pleural effusion. Impressions: - Severe global reduction in LV systolic function; mild LVE and   LVH; calcified aortic valve with severe AS and mild to moderate   AI; moderate LAE; moderate RVE with moderately reduced function;   mild TR with severely elevated pulmonary pressure.   07/10/17 RIGHT HEART CATH AND CORONARY ANGIOGRAPHY  Conclusion  1. Severe single-vessel coronary artery disease with severe stenosis in the mid LAD and otherwise mild diffuse nonobstructive calcific coronary disease 2. Known severe aortic stenosis with very heavy calcification of the aortic valve on plain fluoroscopy and severely restricted aortic leaflet mobility 3. Severe pulmonary hypertension likely secondary to left heart disease with severely elevated pulmonary capillary wedge pressure  Recommendation: Continued evaluation by the multidisciplinary heart valve team. Likely proceed with PCI of the LAD followed by TAVR pending cardiac surgical consultation    Cardiac CT 07/12/17 IMPRESSION: 1. Trileaflet aortic valve with heavily calcified leaflets and severe leaflet opening restriction. There are significant asymmetric calcifications extending into the LVOT under the non-coronary leaflet. Annular measurements  suitable for delivery of a 29 mm Edwards-SAPIEN valve. 2. Sufficient annulus to coronary distance. 3. Optimum Fluoroscopic Angle for Delivery:  LAU 7 CAU 7. 4. No Thrombus in the left atrial appendage. 5. Dilated pulmonary artery measuring 33 x 32 mm suggestive of pulmonary hypertension.   Carotid doppler 07/14/17 Summary: Bilateral: mild soft plaque CCA. Mild mixed plaque origin and proximal ICA. 1-39% ICA plaquing. vertebral artery flow is antegrade.   CT angio abdomen/pelvis/chest: 07/12/17 IMPRESSION: 1. Vascular findings and measurements pertinent to potential TAVR procedure, as detailed above. This patient does have suitable pelvic arterial access bilaterally. 2. Severe thickening calcification of the aortic valve, compatible with the reported clinical history of severe aortic stenosis. 3. Findings in the thorax suggestive of underlying congestive heart failure, including moderate pulmonary edema, moderate right and small  a moderate left pleural effusions, and cardiomegaly. 4. Aortic atherosclerosis, in addition to left anterior descending coronary artery disease. Assessment for potential risk factor modification, dietary therapy or pharmacologic therapy may be warranted, if clinically indicated. 5. Colonic diverticulosis without evidence to suggest an acute diverticulitis at this time. 6. Additional incidental findings, as above. Aortic Atherosclerosis (ICD10-I70.0).   Patient Profile     Edward Strickland is a 76 y.o. male with a hx of HTN, aortic stenosis, and COPD who was transferred to [facility] from [place] on 07/09/17 for further work up of his severe AS and new systolic CHF.   Assessment & Plan    Severe symptomatic stage D AS: echo from [facility] showed severe aortic stenosis with peak gradient of 89 mmHg and mean transvalvular gradient of 51 mmHg. Repeat echo here shows the aortic valve is severely calcified and markedly restricted. There is at least mild aortic  insufficiency. DVI 0.16. [facility] 9/12 showed single vessel LAD stenosis that will require revascularization. Undergoing pre TAVR work up. CTs show anatomy suitable for an Edwards 42mm valve and transfemoral access.   New systolic CHF: EF 28-78% by echo. Likely LV dilation 2/2 severe AS. He was diuresed IV lasix. Net neg 5.4L. Weight down 9 lbs ( 150--> 141). Creat bumped to 1.46. I will decrease lasix from 40mg  BID to 40mg  daily and hold tomorrow AM dose before heart cath.   CAD: cath 9/12 showed 90% mLAD stenosis that will require revascularization. Plan for PCI of the LAD on 9/19. Orders placed. Will plan to load with plavix 600mg  tonight. Continue ASA 81mg  daily, Lopressor 12.5mg  BID and atorvastatin 80mg  daily.   Right pleural effusion: likely 2/2 CHF/high RA pressures. s/p IR thoracentesis 9/13 which drained 2 L from the right pleural space.   AKI: creat bumped to 1.45. Holding lasix given heart cath tomorrow.   COPD: PFTS showed severe obstruction   HTN: BP soft. Hold lasix as above and BB for now.   Dental: s/p multiple dental extractions yesterday in the OR  Dispo: he does not have a lot of social support and will likely need short term placement in a SNF at discharge.   SignedAngelena Form, PA-C  07/16/2017, 8:28 AM  Pager [phone]  Patient seen, examined. Available data reviewed. Agree with findings, assessment, and plan as outlined by Nell Range, PA-C. The patient is alert and oriented, in no distress. JVP is normal. There is no peripheral edema. Cardiovascular exam is unchanged. Agree with plans as outlined above. The patient will be loaded with clopidogrel tonight and he is scheduled to undergo PCI of the LAD tomorrow with Dr. Angelena Form. I have reviewed risks, indications, and alternatives to PCI with the patient. He understands and agrees to proceed. Agree that he is at his dry weight and should not be diuresed further especially with his creatinine trending slightly upward.  Because of his critical aortic stenosis, severe LV dysfunction, and lack of any social support, I agree that he should be kept as an inpatient until he undergoes TAVR next week.  Sherren Mocha, M.D. 07/16/2017 2:16 PM

## 2017-07-17 NOTE — Progress Notes (Signed)
PT Cancellation Note  Patient Details Name: Albertus Chiarelli MRN: [id number] DOB: [birth year]   Cancelled Treatment:    Reason Eval/Treat Not Completed: Patient at procedure or test/unavailable PT will follow up this afternoon as able.  Veryl Winemiller B. Migdalia Dk PT, DPT Acute Rehabilitation  [phone] Pager [phone]  [place] 07/17/2017, 9:59 AM

## 2017-07-17 NOTE — Interval H&P Note (Signed)
History and Physical Interval Note:  07/17/2017 9:23 AM  Edward Strickland  has presented today for PCI of the LAD with the diagnosis of CAD/severe AS pre tavr  The various methods of treatment have been discussed with the patient and family. After consideration of risks, benefits and other options for treatment, the patient has consented to  Procedure(s): CORONARY STENT INTERVENTION (N/A) as a surgical intervention .  The patient's history has been reviewed, patient examined, no change in status, stable for surgery.  I have reviewed the patient's chart and labs.  Questions were answered to the patient's satisfaction.    Cath Lab Visit (complete for each Cath Lab visit)  Clinical Evaluation Leading to the Procedure:   ACS: No.  Non-ACS:    Anginal Classification: CCS III  Anti-ischemic medical therapy: Minimal Therapy (1 class of medications)  Non-Invasive Test Results: No non-invasive testing performed  Prior CABG: No previous CABG         Lauree Chandler

## 2017-07-17 NOTE — Care Management Note (Addendum)
Case Management Note  Patient Details  Name: Edward Strickland MRN: [id number] Date of Birth: 1940-10-30  Subjective/Objective:   From home alone, s/p coronary stent intervention, will be on plavix, per pt eval rec HHPT,  NCM spoke with patient and offered choice, he stated "No, No, I do not want HH, I have a friend who is with me and will help me, and he then preceded to hand the NCM the phone, there was a lady on the phone who gave me [name] 's phone number cell [phone] and home is [phone].  She states she helps patient out a lot because he took care of her mother.   9/20 [place], BSN - he is s/p coronary stent intervention, he has severe AS and new syst CHF,planning for Valve surgery on 9/25.  PT will need to reassess him after surgery.                    Action/Plan: NCM will follow for dc needs.   Expected Discharge Date:  07/11/17               Expected Discharge Plan:  [place]  In-House Referral:     Discharge planning Services  CM Consult  Post Acute Care Choice:  Home Health Choice offered to:  Patient  DME Arranged:    DME Agency:     HH Arranged:  Patient Refused [place] Agency:     Status of Service:  In process, will continue to follow  If discussed at Long Length of Stay Meetings, dates discussed:    Additional Comments:  Zenon Mayo, RN 07/17/2017, 4:45 PM

## 2017-07-17 NOTE — Progress Notes (Signed)
Dr. Angelena Form made aware of BP. Patient alert and oriented

## 2017-07-18 LAB — CBC
HCT: 38.8 % — ABNORMAL LOW (ref 39.0–52.0)
HEMOGLOBIN: 12.4 g/dL — AB (ref 13.0–17.0)
MCH: 28.2 pg (ref 26.0–34.0)
MCHC: 32 g/dL (ref 30.0–36.0)
MCV: 88.4 fL (ref 78.0–100.0)
PLATELETS: 215 10*3/uL (ref 150–400)
RBC: 4.39 MIL/uL (ref 4.22–5.81)
RDW: 14.8 % (ref 11.5–15.5)
WBC: 9.4 10*3/uL (ref 4.0–10.5)

## 2017-07-18 LAB — BASIC METABOLIC PANEL
ANION GAP: 8 (ref 5–15)
BUN: 22 mg/dL — ABNORMAL HIGH (ref 6–20)
CALCIUM: 8.1 mg/dL — AB (ref 8.9–10.3)
CHLORIDE: 102 mmol/L (ref 101–111)
CO2: 25 mmol/L (ref 22–32)
Creatinine, Ser: 1.01 mg/dL (ref 0.61–1.24)
GFR calc Af Amer: >60 mL/min (ref >60)
GFR calc non Af Amer: >60 mL/min (ref >60)
GLUCOSE: 100 mg/dL — AB (ref 65–99)
Potassium: 3.8 mmol/L (ref 3.5–5.1)
Sodium: 135 mmol/L (ref 135–145)

## 2017-07-18 LAB — GLUCOSE, CAPILLARY: GLUCOSE-CAPILLARY: 144 mg/dL — AB (ref 65–99)

## 2017-07-18 MED ORDER — MAGNESIUM HYDROXIDE 400 MG/5ML PO SUSP: 30.0000 mL | Freq: Every day | ORAL | Status: DC | PRN

## 2017-07-18 NOTE — Progress Notes (Signed)
Patient Name: Edward Strickland Date of Encounter: 07/18/2017  Primary Cardiologist: Dr. Burt Knack- he may be able to establish in [facility] eventually  Hospital Problem List     Principal Problem:   Acute on chronic systolic CHF (congestive heart failure) ([place]) Active Problems:   Aortic stenosis   CKD (chronic kidney disease), stage III   Essential hypertension   COPD (chronic obstructive pulmonary disease) (H[facility])   Pleural effusion   Coronary artery disease involving native coronary artery of native heart with unstable angina pectoris (HCC)     Subjective   No complaints.   Inpatient Medications    Scheduled Meds: . aminocaproic acid  5 mL Oral Q4H while awake  . aspirin EC  81 mg Oral Daily  . atorvastatin  80 mg Oral q1800  . bacitracin  1 application Topical A5W  . clopidogrel  75 mg Oral Daily  . feeding supplement (ENSURE ENLIVE)  237 mL Oral BID BM  . furosemide  40 mg Oral Daily  . heparin subcutaneous  5,000 Units Subcutaneous Q8H  . potassium chloride  20 mEq Oral Daily  . sodium chloride flush  3 mL Intravenous Q12H  . sodium chloride flush  3 mL Intravenous Q12H   Continuous Infusions: . sodium chloride    . sodium chloride    . lactated ringers 10 mL/hr at 07/15/17 1451   PRN Meds: sodium chloride, sodium chloride, acetaminophen **OR** acetaminophen, aminocaproic acid, lidocaine (PF), magnesium hydroxide, ondansetron **OR** ondansetron (ZOFRAN) IV, oxyCODONE-acetaminophen, sodium chloride flush, sodium chloride flush   Vital Signs    Vitals:   07/17/17 1500 07/17/17 2019 07/18/17 0549 07/18/17 0800  BP: (!) 94/43 (!) 85/64 (!) 104/54 101/62  Pulse: 90 83 77 66  Resp: 19 (!) 22 (!) 24 15  Temp: (!) 97.5 F (36.4 C) 98.1 F (36.7 C) (!) 97.5 F (36.4 C) 98 F (36.7 C)  TempSrc: Oral Oral Oral Oral  SpO2: 99% 95% 95% 96%  Weight:   141 lb 12.1 oz (64.3 kg)   Height:        Intake/Output Summary (Last 24 hours) at 07/18/17 1101 Last data filed at  07/18/17 0107  Gross per 24 hour  Intake              720 ml  Output              650 ml  Net               70 ml   Filed Weights   07/16/17 0421 07/17/17 0525 07/18/17 0549  Weight: 141 lb 12.8 oz (64.3 kg) 138 lb 4.8 oz (62.7 kg) 141 lb 12.1 oz (64.3 kg)    Physical Exam   GEN: Well nourished, well developed, in no acute distress. Thin, frail appreaing HEENT: Grossly normal.  Neck: Supple, no JVD, carotid upstrokes delayed with bilateral bruits. no masses. Cardiac: RRR, 3/6 harsh late peaking systolic murmur at RUSB/LLSB. No rubs or gallops. No clubbing, cyanosis. No pretibial edema with skin mottling  Radials/DP/PT 2+ and equal bilaterally.  Respiratory:  Respirations regular and unlabored, diminished breath sounds R> L, mild wheezing GI: Soft, nontender, nondistended, BS + x 4. MS: no deformity or atrophy. Skin: warm and dry, no rash. chronic stasis changes L>R Neuro:  Strength and sensation are intact. Psych: AAOx3.  Normal affect.  Labs    CBC  Recent Labs  07/16/17 0849 07/18/17 0239  WBC 12.5* 9.4  HGB 13.1 12.4*  HCT 40.5 38.8*  MCV 89.2 88.4  PLT 237 784   Basic Metabolic Panel  Recent Labs  07/17/17 0503 07/18/17 0239  NA 134* 135  K 4.1 3.8  CL 99* 102  CO2 28 25  GLUCOSE 93 100*  BUN 27* 22*  CREATININE 1.25* 1.01  CALCIUM 8.6* 8.1*   Liver Function Tests No results for input(s): AST, ALT, ALKPHOS, BILITOT, PROT, ALBUMIN in the last 72 hours. No results for input(s): LIPASE, AMYLASE in the last 72 hours. Cardiac Enzymes No results for input(s): CKTOTAL, CKMB, CKMBINDEX, TROPONINI in the last 72 hours. BNP Invalid input(s): POCBNP D-Dimer No results for input(s): DDIMER in the last 72 hours. Hemoglobin A1C No results for input(s): HGBA1C in the last 72 hours. Fasting Lipid Panel No results for input(s): CHOL, HDL, LDLCALC, TRIG, CHOLHDL, LDLDIRECT in the last 72 hours. Thyroid Function Tests No results for input(s): TSH, T4TOTAL, T3FREE,  THYROIDAB in the last 72 hours.  Invalid input(s): FREET3  Telemetry    NSR - Personally Reviewed  ECG    sinus with 1st deg AV block, LVH with repol abnormality, HR 74 - Personally Reviewed  Radiology    No results found.  Cardiac Studies   2D ECHO: 07/09/2017 LV EF: 25% -   30% Study Conclusion - Left ventricle: The cavity size was mildly dilated. Wall   thickness was increased in a pattern of mild LVH. Systolic   function was severely reduced. The estimated ejection fraction   was in the range of 25% to 30%. Diffuse hypokinesis. - Aortic valve: Valve mobility was restricted. There was severe   stenosis. There was mild to moderate regurgitation. Valve area   (VTI): 0.41 cm^2. Valve area (Vmax): 0.35 cm^2. Valve area   (Vmean): 0.39 cm^2. - Left atrium: The atrium was moderately dilated. - Right ventricle: The cavity size was moderately dilated. Systolic   function was moderately reduced. - Right atrium: The atrium was moderately dilated. - Pulmonary arteries: Systolic pressure was severely increased. PA   peak pressure: 79 mm Hg (S). - Pericardium, extracardiac: There was a left pleural effusion. Impressions: - Severe global reduction in LV systolic function; mild LVE and   LVH; calcified aortic valve with severe AS and mild to moderate   AI; moderate LAE; moderate RVE with moderately reduced function;   mild TR with severely elevated pulmonary pressure.   07/10/17 RIGHT HEART CATH AND CORONARY ANGIOGRAPHY  Conclusion  1. Severe single-vessel coronary artery disease with severe stenosis in the mid LAD and otherwise mild diffuse nonobstructive calcific coronary disease 2. Known severe aortic stenosis with very heavy calcification of the aortic valve on plain fluoroscopy and severely restricted aortic leaflet mobility 3. Severe pulmonary hypertension likely secondary to left heart disease with severely elevated pulmonary capillary wedge pressure  Recommendation:  Continued evaluation by the multidisciplinary heart valve team. Likely proceed with PCI of the LAD followed by TAVR pending cardiac surgical consultation    Cardiac CT 07/12/17 IMPRESSION: 1. Trileaflet aortic valve with heavily calcified leaflets and severe leaflet opening restriction. There are significant asymmetric calcifications extending into the LVOT under the non-coronary leaflet. Annular measurements suitable for delivery of a 29 mm Edwards-SAPIEN valve. 2. Sufficient annulus to coronary distance. 3. Optimum Fluoroscopic Angle for Delivery:  LAU 7 CAU 7. 4. No Thrombus in the left atrial appendage. 5. Dilated pulmonary artery measuring 33 x 32 mm suggestive of pulmonary hypertension.   Carotid doppler 07/14/17 Summary: Bilateral: mild soft plaque CCA. Mild mixed plaque origin and proximal ICA.  1-39% ICA plaquing. vertebral artery flow is antegrade.   CT angio abdomen/pelvis/chest: 07/12/17 IMPRESSION: 1. Vascular findings and measurements pertinent to potential TAVR procedure, as detailed above. This patient does have suitable pelvic arterial access bilaterally. 2. Severe thickening calcification of the aortic valve, compatible with the reported clinical history of severe aortic stenosis. 3. Findings in the thorax suggestive of underlying congestive heart failure, including moderate pulmonary edema, moderate right and small a moderate left pleural effusions, and cardiomegaly. 4. Aortic atherosclerosis, in addition to left anterior descending coronary artery disease. Assessment for potential risk factor modification, dietary therapy or pharmacologic therapy may be warranted, if clinically indicated. 5. Colonic diverticulosis without evidence to suggest an acute diverticulitis at this time. 6. Additional incidental findings, as above. Aortic Atherosclerosis (ICD10-I70.0).   07/14/17 CORONARY STENT INTERVENTION  Conclusion   1. Severe stenosis mid LAD 2.  Successful PTCA/DES x 1 mid LAD  Recommendations: Will continue DAPT with ASA and Plavix for at least 3 months but longer if he tolerates. Continue planning for TAVR.      Patient Profile     Edward Strickland is a 76 y.o. male with a hx of HTN, aortic stenosis, and COPD who was transferred to [facility] from [place] on 07/09/17 for further work up of his severe AS and new systolic CHF.   Assessment & Plan    Severe symptomatic stage D AS: echo from [facility] showed severe aortic stenosis with peak gradient of 89 mmHg and mean transvalvular gradient of 51 mmHg. Repeat echo here shows the aortic valve is severely calcified and markedly restricted. There is at least mild aortic insufficiency. DVI 0.16. [facility] 9/12 showed single vessel LAD stenosis that will require revascularization. Undergoing pre TAVR work up. CTs show anatomy suitable for an Edwards 15mm valve and transfemoral access. TAVR is planned for next Tuesday 07/23/17.  New systolic CHF: EF 31-54% by echo. Likely LV dilation 2/2 severe AS. He was diuresed IV lasix. Net neg 6.5L. Weight down 9 lbs ( 150--> 141). Now euvolemic. Creat bumped to 1.46 and lasix was held. His creat has now stabilized and we have resumed lasix 40mg  daily   CAD: cath 9/12 showed 90% mLAD stenosis. He underwent PCI/DEs of the LAD on 9/19. Continue ASA/plavix and atorvastatin 80mg  daily. BB discontinued 2/2 soft BPs  Right pleural effusion: s/p IR thoracentesis 9/13 which drained 2 L from the right pleural space.   AKI: creat now stablazied.   COPD: PFTS showed severe obstruction   HTN: BP soft but stable  Dental: s/p multiple dental extractions 9/17 by Dr. Enrique Sack    Dispo: he does not have a lot of social support and will likely need short term placement in a SNF at discharge. He has refused home health services  Signed, Angelena Form, PA-C  07/18/2017, 11:01 AM  Pager [phone]

## 2017-07-18 NOTE — Progress Notes (Signed)
Physical Therapy Treatment Patient Details Name: Edward Strickland MRN: [id number] DOB: 04/16/1941 Today's Date: 07/18/2017    History of Present Illness Pt is a 76 yo male who presented to ED with SOB and LE swelling, recently diagnosed with critical aortic stenosis, Plan for TAVR possibly tomorrow. PMH for HTN, CKD, and CHF.     PT Comments    Patient progressing slowly towards PT goals. Pt requires Min A for balance during gait training esp when using SPC. Recommend using RW for support as pt reaching for furniture with other UE. VSS throughout. Pt requires repetition of cues at times. Will continue to follow and progress as tolerated.     Follow Up Recommendations  Home health PT;Supervision - Intermittent     Equipment Recommendations  None recommended by PT    Recommendations for Other Services       Precautions / Restrictions Precautions Precautions: Fall Restrictions Weight Bearing Restrictions: No    Mobility  Bed Mobility Overal bed mobility: Needs Assistance Bed Mobility: Supine to Sit     Supine to sit: Min assist     General bed mobility comments: Increased effort and difficulty getting to EOB requirng Min A to elevate trunk as pt with LOB x2 posteriorly.  Transfers Overall transfer level: Needs assistance Equipment used: Straight cane Transfers: Sit to/from Stand Sit to Stand: Min guard         General transfer comment: min guard for safety, Pt not able to flex left knee so some difficulty standing. Stood from [employer], from toilet x1. Transferred to chair.   Ambulation/Gait Ambulation/Gait assistance: Min assist Ambulation Distance (Feet): 20 Feet (x2 bouts) Assistive device: Straight cane Gait Pattern/deviations: Step-through pattern;Trunk flexed;Decreased step length - right;Decreased stance time - left Gait velocity: slowed Gait velocity interpretation: Below normal speed for age/gender General Gait Details: Min A for balance/safety. Pt reaching  with LUE for door, furniture for support. Would do better with BUE support.   Stairs            Wheelchair Mobility    Modified Rankin (Stroke Patients Only)       Balance Overall balance assessment: Needs assistance Sitting-balance support: No upper extremity supported;Feet supported Sitting balance-Leahy Scale: Poor Sitting balance - Comments: Pt with posterior lean sitting EOB difficulty sitting upright without support.   Standing balance support: During functional activity;Single extremity supported Standing balance-Leahy Scale: Poor Standing balance comment: Requires UE support in standing, uses SPC but reaching for support withother UE.                            Cognition Arousal/Alertness: Awake/alert Behavior During Therapy: WFL for tasks assessed/performed Overall Cognitive Status: No family/caregiver present to determine baseline cognitive functioning                                 General Comments: Requires repetition at times.       Exercises      General Comments General comments (skin integrity, edema, etc.): HR up to 115 bpm.      Pertinent Vitals/Pain Pain Assessment: No/denies pain    Home Living                      Prior Function            PT Goals (current goals can now be found in the care plan section)  Progress towards PT goals: Progressing toward goals    Frequency    Min 3X/week      PT Plan Current plan remains appropriate    Co-evaluation              AM-PAC PT "6 Clicks" Daily Activity  Outcome Measure  Difficulty turning over in bed (including adjusting bedclothes, sheets and blankets)?: Unable Difficulty moving from lying on back to sitting on the side of the bed? : Unable Difficulty sitting down on and standing up from a chair with arms (e.g., wheelchair, bedside commode, etc,.)?: None Help needed moving to and from a bed to chair (including a wheelchair)?: A Little Help  needed walking in hospital room?: A Little Help needed climbing 3-5 steps with a railing? : A Lot 6 Click Score: 14    End of Session Equipment Utilized During Treatment: Gait belt Activity Tolerance: Patient tolerated treatment well Patient left: in chair;with call bell/phone within reach Nurse Communication: Mobility status PT Visit Diagnosis: Other abnormalities of gait and mobility (R26.89);Muscle weakness (generalized) (M62.81);Difficulty in walking, not elsewhere classified (R26.2)     Time: 1320-1340 PT Time Calculation (min) (ACUTE ONLY): 20 min  Charges:  $Therapeutic Activity: 8-22 mins                    G Codes:       Edward Strickland, PT, DPT [phone]     Edward Strickland 07/18/2017, 1:54 PM

## 2017-07-19 DIAGNOSIS — I2510 Atherosclerotic heart disease of native coronary artery without angina pectoris: Secondary | ICD-10-CM

## 2017-07-19 LAB — BASIC METABOLIC PANEL
Anion gap: 5 (ref 5–15)
BUN: 21 mg/dL — ABNORMAL HIGH (ref 6–20)
CALCIUM: 8.4 mg/dL — AB (ref 8.9–10.3)
CO2: 25 mmol/L (ref 22–32)
CREATININE: 0.98 mg/dL (ref 0.61–1.24)
Chloride: 105 mmol/L (ref 101–111)
Glucose, Bld: 122 mg/dL — ABNORMAL HIGH (ref 65–99)
Potassium: 4.1 mmol/L (ref 3.5–5.1)
SODIUM: 135 mmol/L (ref 135–145)

## 2017-07-19 LAB — CHOLESTEROL, BODY FLUID: CHOL FL: 23 mg/dL

## 2017-07-19 NOTE — Progress Notes (Signed)
Physical Therapy Treatment Patient Details Name: Edward Strickland MRN: [id number] DOB: 04-16-1941 Today's Date: 07/19/2017    History of Present Illness Pt is a 76 yo male who presented to ED with SOB and LE swelling, recently diagnosed with critical aortic stenosis, Plan for TAVR . PMH for HTN, CKD, and CHF.     PT Comments    Pt with decreased hip and knee flexion of LLE at baseline which impairs gait and transfers with pt performing better with use of RW and pt agreeable to continued use. Pt educated for HEP and encouraged continued ambulation acutely. Pt states he lives with "sis" who cares for him and is eager to return home. Will continue to follow acutely to maximize safety and independence.   HR 88-118 with gait BP pre 96/54 Post 122/77   Follow Up Recommendations  Home health PT;Supervision - Intermittent     Equipment Recommendations  None recommended by PT    Recommendations for Other Services       Precautions / Restrictions Precautions Precautions: Fall Restrictions Weight Bearing Restrictions: No    Mobility  Bed Mobility               General bed mobility comments: in chair on arrival  Transfers Overall transfer level: Needs assistance   Transfers: Sit to/from Stand Sit to Stand: Supervision         General transfer comment: pt with decreased left hip flexion making sitting and standing difficult but able to complete with use of bil armrests with increased time  Ambulation/Gait Ambulation/Gait assistance: Min guard Ambulation Distance (Feet): 400 Feet Assistive device: Rolling walker (2 wheeled) Gait Pattern/deviations: Step-through pattern;Trunk flexed   Gait velocity interpretation: Below normal speed for age/gender General Gait Details: pt with vaulting gait on RLE with LLE shorter than RLE. Pt with cues for posture and position in RW without physical assist required for gait. Encouraged RW use and pt agreed   [employer] Rankin (Stroke Patients Only)       Balance Overall balance assessment: Needs assistance   Sitting balance-Leahy Scale: Fair       Standing balance-Leahy Scale: Poor                              Cognition Arousal/Alertness: Awake/alert Behavior During Therapy: WFL for tasks assessed/performed Overall Cognitive Status: No family/caregiver present to determine baseline cognitive functioning                                 General Comments: pt perseverating on wanting to get screwed in teeth to eat corn      Exercises General Exercises - Lower Extremity Long Arc Quad: AROM;Both;Seated;10 reps Hip Flexion/Marching: AROM;Both;Seated;10 reps    General Comments        Pertinent Vitals/Pain Pain Assessment: No/denies pain    Home Living                      Prior Function            PT Goals (current goals can now be found in the care plan section) Progress towards PT goals: Progressing toward goals    Frequency           PT Plan Current plan remains appropriate  Co-evaluation              AM-PAC PT "6 Clicks" Daily Activity  Outcome Measure  Difficulty turning over in bed (including adjusting bedclothes, sheets and blankets)?: A Lot Difficulty moving from lying on back to sitting on the side of the bed? : A Lot Difficulty sitting down on and standing up from a chair with arms (e.g., wheelchair, bedside commode, etc,.)?: A Little Help needed moving to and from a bed to chair (including a wheelchair)?: None Help needed walking in hospital room?: A Little Help needed climbing 3-5 steps with a railing? : A Little 6 Click Score: 17    End of Session Equipment Utilized During Treatment: Gait belt Activity Tolerance: Patient tolerated treatment well Patient left: in chair;with call bell/phone within reach;with chair alarm set Nurse Communication: Mobility status PT Visit  Diagnosis: Other abnormalities of gait and mobility (R26.89);Muscle weakness (generalized) (M62.81);Difficulty in walking, not elsewhere classified (R26.2)     Time: 5859-2924 PT Time Calculation (min) (ACUTE ONLY): 18 min  Charges:  $Gait Training: 8-22 mins                    G Codes:       Elwyn Reach, PT [phone]    Albert Lea 07/19/2017, 1:34 PM

## 2017-07-19 NOTE — Progress Notes (Signed)
CARDIAC REHAB PHASE I   PRE:  Rate/Rhythm: 88 SR  BP:  Sitting: 105/56        SaO2: 96 RA  MODE:  Ambulation: 290 ft   POST:  Rate/Rhythm: 120 ST  BP:  Sitting: 118/75         SaO2: 95 RA  Pt required assistance x2 to stand due to inability to fully bend LLE. Pt ambulated 290 ft on RA, rolling walker, gait belt, assist x1 to walk, mildly unsteady gait at baseline, tolerated well with no complaints. Pt to recliner after walk, feet elevated, call bell within reach. Will follow.    [id number] Edward Sciara, RN, BSN 07/19/2017 8:49 AM

## 2017-07-19 NOTE — Progress Notes (Signed)
Patient Name: Edward Strickland Date of Encounter: 07/19/2017  Primary Cardiologist: Dr. Burt Knack- he may be able to establish in [facility] eventually  Hospital Problem List     Principal Problem:   Acute on chronic systolic CHF (congestive heart failure) ([place]) Active Problems:   Aortic stenosis   CKD (chronic kidney disease), stage III   Essential hypertension   COPD (chronic obstructive pulmonary disease) (HCC)   Pleural effusion   Coronary artery disease involving native coronary artery of native heart with unstable angina pectoris (HCC)     Subjective   No complaints.   Inpatient Medications    Scheduled Meds: . aspirin EC  81 mg Oral Daily  . atorvastatin  80 mg Oral q1800  . bacitracin  1 application Topical P5T  . clopidogrel  75 mg Oral Daily  . feeding supplement (ENSURE ENLIVE)  237 mL Oral BID BM  . furosemide  40 mg Oral Daily  . heparin subcutaneous  5,000 Units Subcutaneous Q8H  . potassium chloride  20 mEq Oral Daily  . sodium chloride flush  3 mL Intravenous Q12H  . sodium chloride flush  3 mL Intravenous Q12H   Continuous Infusions: . sodium chloride    . sodium chloride    . lactated ringers 10 mL/hr at 07/15/17 1451   PRN Meds: sodium chloride, sodium chloride, acetaminophen **OR** acetaminophen, aminocaproic acid, lidocaine (PF), magnesium hydroxide, ondansetron **OR** ondansetron (ZOFRAN) IV, oxyCODONE-acetaminophen, sodium chloride flush, sodium chloride flush   Vital Signs    Vitals:   07/18/17 1500 07/18/17 2028 07/19/17 0500 07/19/17 0625  BP: 96/64 94/60  (!) 104/58  Pulse:  84 71 77  Resp:  (!) 22 (!) 21 (!) 21  Temp: 97.6 F (36.4 C) 98.6 F (37 C) 98.1 F (36.7 C) 98.2 F (36.8 C)  TempSrc: Oral Oral Oral Oral  SpO2:  94% 94% 95%  Weight:    142 lb 13.7 oz (64.8 kg)  Height:        Intake/Output Summary (Last 24 hours) at 07/19/17 0926 Last data filed at 07/19/17 0600  Gross per 24 hour  Intake              720 ml  Output              1325 ml  Net             -605 ml   Filed Weights   07/17/17 0525 07/18/17 0549 07/19/17 0625  Weight: 138 lb 4.8 oz (62.7 kg) 141 lb 12.1 oz (64.3 kg) 142 lb 13.7 oz (64.8 kg)    Physical Exam   GEN: Well nourished, well developed, in no acute distress. Thin, frail appreaing HEENT: Grossly normal.  Neck: Supple, no JVD, carotid upstrokes delayed with bilateral bruits. no masses. Cardiac: RRR, 3/6 harsh late peaking systolic murmur at RUSB/LLSB. No rubs or gallops. No clubbing, cyanosis. No pretibial edema.  Radials/DP/PT 2+ and equal bilaterally.  Respiratory:  Respirations regular and unlabored, diminished breath sounds R> L, mild wheezing GI: Soft, nontender, nondistended, BS + x 4. MS: no deformity or atrophy. Skin: warm and dry, no rash. chronic stasis changes L>R Neuro:  Strength and sensation are intact. Psych: AAOx3.  Normal affect.  Labs    CBC  Recent Labs  07/18/17 0239  WBC 9.4  HGB 12.4*  HCT 38.8*  MCV 88.4  PLT 614   Basic Metabolic Panel  Recent Labs  07/17/17 0503 07/18/17 0239  NA 134* 135  K 4.1  3.8  CL 99* 102  CO2 28 25  GLUCOSE 93 100*  BUN 27* 22*  CREATININE 1.25* 1.01  CALCIUM 8.6* 8.1*   Liver Function Tests No results for input(s): AST, ALT, ALKPHOS, BILITOT, PROT, ALBUMIN in the last 72 hours. No results for input(s): LIPASE, AMYLASE in the last 72 hours. Cardiac Enzymes No results for input(s): CKTOTAL, CKMB, CKMBINDEX, TROPONINI in the last 72 hours. BNP Invalid input(s): POCBNP D-Dimer No results for input(s): DDIMER in the last 72 hours. Hemoglobin A1C No results for input(s): HGBA1C in the last 72 hours. Fasting Lipid Panel No results for input(s): CHOL, HDL, LDLCALC, TRIG, CHOLHDL, LDLDIRECT in the last 72 hours. Thyroid Function Tests No results for input(s): TSH, T4TOTAL, T3FREE, THYROIDAB in the last 72 hours.  Invalid input(s): FREET3  Telemetry    NSR - Personally Reviewed  ECG    sinus with 1st deg  AV block, LVH with repol abnormality, HR 74 - Personally Reviewed  Radiology    No results found.  Cardiac Studies   2D ECHO: 07/09/2017 LV EF: 25% -   30% Study Conclusion - Left ventricle: The cavity size was mildly dilated. Wall   thickness was increased in a pattern of mild LVH. Systolic   function was severely reduced. The estimated ejection fraction   was in the range of 25% to 30%. Diffuse hypokinesis. - Aortic valve: Valve mobility was restricted. There was severe   stenosis. There was mild to moderate regurgitation. Valve area   (VTI): 0.41 cm^2. Valve area (Vmax): 0.35 cm^2. Valve area   (Vmean): 0.39 cm^2. - Left atrium: The atrium was moderately dilated. - Right ventricle: The cavity size was moderately dilated. Systolic   function was moderately reduced. - Right atrium: The atrium was moderately dilated. - Pulmonary arteries: Systolic pressure was severely increased. PA   peak pressure: 79 mm Hg (S). - Pericardium, extracardiac: There was a left pleural effusion. Impressions: - Severe global reduction in LV systolic function; mild LVE and   LVH; calcified aortic valve with severe AS and mild to moderate   AI; moderate LAE; moderate RVE with moderately reduced function;   mild TR with severely elevated pulmonary pressure.   07/10/17 RIGHT HEART CATH AND CORONARY ANGIOGRAPHY  Conclusion  1. Severe single-vessel coronary artery disease with severe stenosis in the mid LAD and otherwise mild diffuse nonobstructive calcific coronary disease 2. Known severe aortic stenosis with very heavy calcification of the aortic valve on plain fluoroscopy and severely restricted aortic leaflet mobility 3. Severe pulmonary hypertension likely secondary to left heart disease with severely elevated pulmonary capillary wedge pressure  Recommendation: Continued evaluation by the multidisciplinary heart valve team. Likely proceed with PCI of the LAD followed by TAVR pending cardiac  surgical consultation    Cardiac CT 07/12/17 IMPRESSION: 1. Trileaflet aortic valve with heavily calcified leaflets and severe leaflet opening restriction. There are significant asymmetric calcifications extending into the LVOT under the non-coronary leaflet. Annular measurements suitable for delivery of a 29 mm Edwards-SAPIEN valve. 2. Sufficient annulus to coronary distance. 3. Optimum Fluoroscopic Angle for Delivery:  LAU 7 CAU 7. 4. No Thrombus in the left atrial appendage. 5. Dilated pulmonary artery measuring 33 x 32 mm suggestive of pulmonary hypertension.   Carotid doppler 07/14/17 Summary: Bilateral: mild soft plaque CCA. Mild mixed plaque origin and proximal ICA. 1-39% ICA plaquing. vertebral artery flow is antegrade.   CT angio abdomen/pelvis/chest: 07/12/17 IMPRESSION: 1. Vascular findings and measurements pertinent to potential TAVR procedure, as detailed  above. This patient does have suitable pelvic arterial access bilaterally. 2. Severe thickening calcification of the aortic valve, compatible with the reported clinical history of severe aortic stenosis. 3. Findings in the thorax suggestive of underlying congestive heart failure, including moderate pulmonary edema, moderate right and small a moderate left pleural effusions, and cardiomegaly. 4. Aortic atherosclerosis, in addition to left anterior descending coronary artery disease. Assessment for potential risk factor modification, dietary therapy or pharmacologic therapy may be warranted, if clinically indicated. 5. Colonic diverticulosis without evidence to suggest an acute diverticulitis at this time. 6. Additional incidental findings, as above. Aortic Atherosclerosis (ICD10-I70.0).   07/14/17 CORONARY STENT INTERVENTION  Conclusion   1. Severe stenosis mid LAD 2. Successful PTCA/DES x 1 mid LAD  Recommendations: Will continue DAPT with ASA and Plavix for at least 3 months but longer if he tolerates.  Continue planning for TAVR.      Patient Profile     Edward Strickland is a 76 y.o. male with a hx of HTN, aortic stenosis, and COPD who was transferred to [facility] from [place] on 07/09/17 for further work up of his severe AS and new systolic CHF.   Assessment & Plan    Severe symptomatic stage D AS: echo from [facility] showed severe aortic stenosis with peak gradient of 89 mmHg and mean transvalvular gradient of 51 mmHg. Repeat echo here shows the aortic valve is severely calcified and markedly restricted. There is at least mild aortic insufficiency. DVI 0.16. Undergoing pre TAVR work up. CTs show anatomy suitable for an Edwards 64mm valve and transfemoral access. TAVR is planned for next Tuesday 07/23/17.  New systolic CHF: EF 41-93% by echo. Likely LV dilation 2/2 severe AS. He was diuresed IV lasix. Net neg 6.5L. Weight down 9 lbs ( 150--> 141). Now euvolemic. Creat bumped to 1.46 and lasix was held. His creat has now stabilized and we have resumed lasix 40mg  daily. BMET pending today  CAD: cath 9/12 showed 90% mLAD stenosis. He underwent PCI/DEs of the LAD on 9/19. Continue ASA/plavix and atorvastatin 80mg  daily. BB discontinued 2/2 soft BPs  Right pleural effusion: s/p IR thoracentesis 9/13 which drained 2 L from the right pleural space.   AKI: creat now stablazied.   COPD: PFTS showed severe obstruction   HTN: BP soft but stable  Dental: s/p multiple dental extractions 9/17 by Dr. Enrique Sack    Dispo: he does not have a lot of social support and will likely need short term placement in a SNF at discharge. He has refused home health services. PT consulted given prolonged hospitalization  DVT prophylaxis: he was accidentally given an extra dose of heparin 5000 units this morning. No s/s bleeding. Will skip next dose   Signed, Angelena Form, PA-C  07/19/2017, 9:26 AM  Pager [phone]  I have personally seen and examined this patient with Angelena Form, PA-C. I agree with the assessment  and plan as outlined above. He is doing well post PCI. Breathing ok. Volume status ok. Plans for TAVR next week. He is on ASA and Plavix.   Lauree Chandler 07/19/2017 9:55 AM

## 2017-07-20 LAB — BASIC METABOLIC PANEL
ANION GAP: 8 (ref 5–15)
BUN: 24 mg/dL — ABNORMAL HIGH (ref 6–20)
CALCIUM: 8.4 mg/dL — AB (ref 8.9–10.3)
CHLORIDE: 104 mmol/L (ref 101–111)
CO2: 23 mmol/L (ref 22–32)
CREATININE: 0.91 mg/dL (ref 0.61–1.24)
GFR calc Af Amer: >60 mL/min (ref >60)
GFR calc non Af Amer: >60 mL/min (ref >60)
GLUCOSE: 101 mg/dL — AB (ref 65–99)
Potassium: 4.2 mmol/L (ref 3.5–5.1)
Sodium: 135 mmol/L (ref 135–145)

## 2017-07-20 NOTE — Progress Notes (Signed)
Instructed pt to order soft foods for the remainder of the day.

## 2017-07-20 NOTE — Progress Notes (Signed)
Patient bleeding from the mouth at site of tooth extraction. Prn amicar given x2 with no change. [facility] NP paged and notified. Verbal order to hold heparin and asprin and continue to monitor patient. Patient stable. VSS wnl. No acute distress. Will continue to monitor patient.

## 2017-07-20 NOTE — Progress Notes (Signed)
Packing removed to assess bleeding, bleeding appears to have slowed down. Amicar and oral rinse tolerated well.

## 2017-07-20 NOTE — Progress Notes (Addendum)
Progress Note  Patient Name: Edward Strickland Date of Encounter: 07/20/2017  Primary Cardiologist:   Subjective   Breathing pretty good  No CP    Inpatient Medications    Scheduled Meds: . aspirin EC  81 mg Oral Daily  . atorvastatin  80 mg Oral q1800  . bacitracin  1 application Topical N6E  . clopidogrel  75 mg Oral Daily  . feeding supplement (ENSURE ENLIVE)  237 mL Oral BID BM  . furosemide  40 mg Oral Daily  . heparin subcutaneous  5,000 Units Subcutaneous Q8H  . potassium chloride  20 mEq Oral Daily  . sodium chloride flush  3 mL Intravenous Q12H  . sodium chloride flush  3 mL Intravenous Q12H   Continuous Infusions: . sodium chloride    . sodium chloride    . lactated ringers 10 mL/hr at 07/15/17 1451   PRN Meds: sodium chloride, sodium chloride, acetaminophen **OR** acetaminophen, aminocaproic acid, lidocaine (PF), magnesium hydroxide, ondansetron **OR** ondansetron (ZOFRAN) IV, oxyCODONE-acetaminophen, sodium chloride flush, sodium chloride flush   Vital Signs    Vitals:   07/19/17 1329 07/19/17 2005 07/19/17 2059 07/20/17 0700  BP: 122/77 (!) 90/52 (!) 108/52 (!) 110/50  Pulse: (!) 118 81 84 74  Resp: 19 (!) 22 19   Temp: 98.2 F (36.8 C) 98.1 F (36.7 C) 97.7 F (36.5 C) 98.2 F (36.8 C)  TempSrc: Oral Oral Oral Oral  SpO2: 98%  96% 95%  Weight:    141 lb 11.2 oz (64.3 kg)  Height:        Intake/Output Summary (Last 24 hours) at 07/20/17 1133 Last data filed at 07/20/17 0439  Gross per 24 hour  Intake              240 ml  Output             1450 ml  Net            -1210 ml   Filed Weights   07/18/17 0549 07/19/17 0625 07/20/17 0700  Weight: 141 lb 12.1 oz (64.3 kg) 142 lb 13.7 oz (64.8 kg) 141 lb 11.2 oz (64.3 kg)    Telemetry    SR   - Personally Reviewed  ECG      Physical Exam   GEN: No acute distress.   Neck: No JVD Cardiac: RRR, III/VI systolic murmur  No rubs, or gallops.  Respiratory:  Decreased inspir effort  Mild rales at  bases  GI: Soft, nontender, non-distended  MS: No edema; No deformity. Neuro:  Nonfocal  Psych: Normal affect   Labs    Chemistry Recent Labs Lab 07/18/17 0239 07/19/17 1018 07/20/17 0452  NA 135 135 135  K 3.8 4.1 4.2  CL 102 105 104  CO2 25 25 23   GLUCOSE 100* 122* 101*  BUN 22* 21* 24*  CREATININE 1.01 0.98 0.91  CALCIUM 8.1* 8.4* 8.4*  GFRNONAA >60 >60 >60  GFRAA >60 >60 >60  ANIONGAP 8 5 8      Hematology Recent Labs Lab 07/16/17 0849 07/18/17 0239  WBC 12.5* 9.4  RBC 4.54 4.39  HGB 13.1 12.4*  HCT 40.5 38.8*  MCV 89.2 88.4  MCH 28.9 28.2  MCHC 32.3 32.0  RDW 14.7 14.8  PLT 237 215    Cardiac EnzymesNo results for input(s): TROPONINI in the last 168 hours. No results for input(s): TROPIPOC in the last 168 hours.   BNPNo results for input(s): BNP, PROBNP in the last 168 hours.  DDimer No results for input(s): DDIMER in the last 168 hours.   Radiology    No results found.  Cardiac Studies     Patient Profile     76 y.o. male 76 yo historu htn and aortic stenosis and CAD  Underent PCI/DES  of LAD  Plan for TAVR on Tuds  Assessment & Plan    1  Aortic Stenosis  Plan for TAVR on Tues  2  CAD  S/p PCI/DES to LAD  Continue ASA and Plavix  3  New systolic CHF   LVEF 25 to 30%  Prob due to AS  Keep on oral lasix for now  Follow I/O  4  COPD  Sever3    5  HTN  BP OK  a6  Dental  S/p extraction    For questions or updates, please contact [facility] Please consult [email or web address] for contact info under Cardiology/STEMI.      Signed, Dorris Carnes, MD  07/20/2017, 11:33 AM

## 2017-07-20 NOTE — Progress Notes (Signed)
Pt extraction sites bleeding. Provided with amicar for swiss and spit.gauze packing provided. Continued to monitor bleeding closely.

## 2017-07-20 NOTE — Progress Notes (Signed)
Pt tooth extraction site continues to bleed, packing and amicar provided.

## 2017-07-20 NOTE — Progress Notes (Signed)
Pt still bleeding from teeth extraction. Gauze packing changed.

## 2017-07-21 LAB — BASIC METABOLIC PANEL
Anion gap: 4 — ABNORMAL LOW (ref 5–15)
BUN: 20 mg/dL (ref 6–20)
CHLORIDE: 104 mmol/L (ref 101–111)
CO2: 26 mmol/L (ref 22–32)
CREATININE: 0.96 mg/dL (ref 0.61–1.24)
Calcium: 8.6 mg/dL — ABNORMAL LOW (ref 8.9–10.3)
GFR calc non Af Amer: >60 mL/min (ref >60)
Glucose, Bld: 90 mg/dL (ref 65–99)
Potassium: 4.2 mmol/L (ref 3.5–5.1)
SODIUM: 134 mmol/L — AB (ref 135–145)

## 2017-07-21 NOTE — Progress Notes (Signed)
Progress Note  Patient Name: Edward Strickland Date of Encounter: 07/21/2017  Primary Cardiologist:   Subjective   Breathing is fair  No CP     Inpatient Medications    Scheduled Meds: . aspirin EC  81 mg Oral Daily  . atorvastatin  80 mg Oral q1800  . bacitracin  1 application Topical L7L  . clopidogrel  75 mg Oral Daily  . feeding supplement (ENSURE ENLIVE)  237 mL Oral BID BM  . furosemide  40 mg Oral Daily  . heparin subcutaneous  5,000 Units Subcutaneous Q8H  . potassium chloride  20 mEq Oral Daily  . sodium chloride flush  3 mL Intravenous Q12H  . sodium chloride flush  3 mL Intravenous Q12H   Continuous Infusions: . sodium chloride    . sodium chloride    . lactated ringers 10 mL/hr at 07/15/17 1451   PRN Meds: sodium chloride, sodium chloride, acetaminophen **OR** acetaminophen, aminocaproic acid, lidocaine (PF), magnesium hydroxide, ondansetron **OR** ondansetron (ZOFRAN) IV, oxyCODONE-acetaminophen, sodium chloride flush, sodium chloride flush   Vital Signs    Vitals:   07/20/17 0700 07/20/17 1233 07/20/17 2011 07/21/17 0631  BP: (!) 110/50 (!) 92/52 (!) 100/51 (!) 96/54  Pulse: 74 75 80 79  Resp:  18 14   Temp: 98.2 F (36.8 C) 98 F (36.7 C) 97.8 F (36.6 C) (!) 97.5 F (36.4 C)  TempSrc: Oral Oral Oral Oral  SpO2: 95% 96% 97% 97%  Weight: 141 lb 11.2 oz (64.3 kg)   139 lb 12.8 oz (63.4 kg)  Height:        Intake/Output Summary (Last 24 hours) at 07/21/17 0748 Last data filed at 07/21/17 0528  Gross per 24 hour  Intake              480 ml  Output             1400 ml  Net             -920 ml   Filed Weights   07/19/17 0625 07/20/17 0700 07/21/17 0631  Weight: 142 lb 13.7 oz (64.8 kg) 141 lb 11.2 oz (64.3 kg) 139 lb 12.8 oz (63.4 kg)    Telemetry    SR   - Personally Reviewed  ECG      Physical Exam   GEN: No acute distress.   Neck: No JVD Cardiac: RRR,  III/VI systolic murmur   No rubs, or gallops.  Respiratory:  Mild rhochi   GI:  Soft, nontender, non-distended  MS: No edema; No deformity. Neuro:  Nonfocal  Psych: Normal affect   Labs    Chemistry  Recent Labs Lab 07/19/17 1018 07/20/17 0452 07/21/17 0539  NA 135 135 134*  K 4.1 4.2 4.2  CL 105 104 104  CO2 25 23 26   GLUCOSE 122* 101* 90  BUN 21* 24* 20  CREATININE 0.98 0.91 0.96  CALCIUM 8.4* 8.4* 8.6*  GFRNONAA >60 >60 >60  GFRAA >60 >60 >60  ANIONGAP 5 8 4*     Hematology  Recent Labs Lab 07/16/17 0849 07/18/17 0239  WBC 12.5* 9.4  RBC 4.54 4.39  HGB 13.1 12.4*  HCT 40.5 38.8*  MCV 89.2 88.4  MCH 28.9 28.2  MCHC 32.3 32.0  RDW 14.7 14.8  PLT 237 215    Cardiac EnzymesNo results for input(s): TROPONINI in the last 168 hours. No results for input(s): TROPIPOC in the last 168 hours.   BNPNo results for input(s): BNP, PROBNP in  the last 168 hours.   DDimer No results for input(s): DDIMER in the last 168 hours.   Radiology    No results found.  Cardiac Studies     Patient Profile     76 y.o. male 76 yo historu htn and aortic stenosis and CAD  Underent PCI/DES  of LAD  Plan for TAVR on Tuds  Assessment & Plan    1  Aortic Stenosis  Sever  Plan for TAVR on 9.25  2  CAD  Single vessel CAD Severe pulmonary HTN    S/p PCI/DES to LAD  Continue ASA and Plavix  3  New systolic CHF   LVEF 25 to 30%  Prob due to AS  Keep on oral lasix for now  Follow I/O  Volume OK    4  COPD  Severe   5  HTN  BP remains soft  Pt not dizzy  a6  Dental  S/p extraction  Some bleeding last night    For questions or updates, please contact [facility] Please consult [email or web address] for contact info under Cardiology/STEMI.      Signed, Dorris Carnes, MD  07/21/2017, 7:48 AM

## 2017-07-21 NOTE — Progress Notes (Signed)
Noted Minimal bleeding as of now. Observed closely

## 2017-07-22 ENCOUNTER — Inpatient Hospital Stay (HOSPITAL_COMMUNITY): Payer: Medicare Other

## 2017-07-22 DIAGNOSIS — I350 Nonrheumatic aortic (valve) stenosis: Secondary | ICD-10-CM

## 2017-07-22 LAB — URINALYSIS, COMPLETE (UACMP) WITH MICROSCOPIC
Bacteria, UA: NONE SEEN
Bilirubin Urine: NEGATIVE
Glucose, UA: NEGATIVE mg/dL
Hgb urine dipstick: NEGATIVE
Ketones, ur: NEGATIVE mg/dL
Leukocytes, UA: NEGATIVE
NITRITE: NEGATIVE
PH: 6 (ref 5.0–8.0)
Protein, ur: NEGATIVE mg/dL
SPECIFIC GRAVITY, URINE: 1.016 (ref 1.005–1.030)
Squamous Epithelial / HPF: NONE SEEN

## 2017-07-22 LAB — COMPREHENSIVE METABOLIC PANEL
ALBUMIN: 2.8 g/dL — AB (ref 3.5–5.0)
ALK PHOS: 63 U/L (ref 38–126)
ALT: 30 U/L (ref 17–63)
ANION GAP: 7 (ref 5–15)
AST: 37 U/L (ref 15–41)
BUN: 24 mg/dL — ABNORMAL HIGH (ref 6–20)
CALCIUM: 8.8 mg/dL — AB (ref 8.9–10.3)
CHLORIDE: 102 mmol/L (ref 101–111)
CO2: 26 mmol/L (ref 22–32)
Creatinine, Ser: 1.03 mg/dL (ref 0.61–1.24)
GFR calc non Af Amer: >60 mL/min (ref >60)
GLUCOSE: 97 mg/dL (ref 65–99)
Potassium: 4.4 mmol/L (ref 3.5–5.1)
SODIUM: 135 mmol/L (ref 135–145)
Total Bilirubin: 0.6 mg/dL (ref 0.3–1.2)
Total Protein: 6.2 g/dL — ABNORMAL LOW (ref 6.5–8.1)

## 2017-07-22 LAB — CBC
HCT: 36.2 % — ABNORMAL LOW (ref 39.0–52.0)
HEMOGLOBIN: 11.6 g/dL — AB (ref 13.0–17.0)
MCH: 28.5 pg (ref 26.0–34.0)
MCHC: 32 g/dL (ref 30.0–36.0)
MCV: 88.9 fL (ref 78.0–100.0)
Platelets: 218 10*3/uL (ref 150–400)
RBC: 4.07 MIL/uL — AB (ref 4.22–5.81)
RDW: 14.9 % (ref 11.5–15.5)
WBC: 8.8 10*3/uL (ref 4.0–10.5)

## 2017-07-22 LAB — BLOOD GAS, ARTERIAL
Acid-Base Excess: 2.5 mmol/L — ABNORMAL HIGH (ref 0.0–2.0)
Bicarbonate: 26 mmol/L (ref 20.0–28.0)
Drawn by: 252031
FIO2: 21
O2 Saturation: 96.7 %
PCO2 ART: 36.2 mmHg (ref 32.0–48.0)
PH ART: 7.47 — AB (ref 7.350–7.450)
Patient temperature: 98.6
pO2, Arterial: 78.6 mmHg — ABNORMAL LOW (ref 83.0–108.0)

## 2017-07-22 LAB — PROTIME-INR
INR: 1.13
PROTHROMBIN TIME: 14.4 s (ref 11.4–15.2)

## 2017-07-22 LAB — PREALBUMIN: PREALBUMIN: 16.4 mg/dL — AB (ref 18–38)

## 2017-07-22 LAB — APTT: aPTT: 32 seconds (ref 24–36)

## 2017-07-22 LAB — ABO/RH: ABO/RH(D): A POS

## 2017-07-22 MED ORDER — MAGNESIUM SULFATE 50 % IJ SOLN
40.0000 meq | INTRAMUSCULAR | Status: DC
  Filled 2017-07-22: qty 10

## 2017-07-22 MED ORDER — SODIUM CHLORIDE 0.9 % IV SOLN
INTRAVENOUS | Status: DC
  Filled 2017-07-22: qty 30

## 2017-07-22 MED ORDER — VANCOMYCIN HCL 10 G IV SOLR
1250.0000 mg | INTRAVENOUS | Status: AC
  Administered 2017-07-23: 1250 mg via INTRAVENOUS
  Filled 2017-07-22: qty 1250

## 2017-07-22 MED ORDER — CHLORHEXIDINE GLUCONATE 4 % EX LIQD
60.0000 mL | Freq: Once | CUTANEOUS | Status: AC
  Administered 2017-07-22: 4 via TOPICAL
  Filled 2017-07-22: qty 30

## 2017-07-22 MED ORDER — POTASSIUM CHLORIDE 2 MEQ/ML IV SOLN
80.0000 meq | INTRAVENOUS | Status: DC
  Filled 2017-07-22: qty 40

## 2017-07-22 MED ORDER — CHLORHEXIDINE GLUCONATE 0.12 % MT SOLN
15.0000 mL | Freq: Once | OROMUCOSAL | Status: AC
  Administered 2017-07-23: 15 mL via OROMUCOSAL
  Filled 2017-07-22: qty 15

## 2017-07-22 MED ORDER — DEXTROSE 5 % IV SOLN
1.5000 g | INTRAVENOUS | Status: AC
  Administered 2017-07-23: 1.5 g via INTRAVENOUS
  Filled 2017-07-22: qty 1.5

## 2017-07-22 MED ORDER — NITROGLYCERIN IN D5W 200-5 MCG/ML-% IV SOLN
2.0000 ug/min | INTRAVENOUS | Status: DC
  Filled 2017-07-22: qty 250

## 2017-07-22 MED ORDER — CHLORHEXIDINE GLUCONATE 4 % EX LIQD
60.0000 mL | Freq: Once | CUTANEOUS | Status: AC
  Administered 2017-07-23: 4 via TOPICAL
  Filled 2017-07-22: qty 60

## 2017-07-22 MED ORDER — DEXTROSE 5 % IV SOLN
0.0000 ug/min | INTRAVENOUS | Status: DC
  Filled 2017-07-22: qty 4

## 2017-07-22 MED ORDER — DOPAMINE-DEXTROSE 3.2-5 MG/ML-% IV SOLN
0.0000 ug/kg/min | INTRAVENOUS | Status: DC
  Filled 2017-07-22: qty 250

## 2017-07-22 MED ORDER — SODIUM CHLORIDE 0.9 % IV SOLN
INTRAVENOUS | Status: DC
  Filled 2017-07-22: qty 1

## 2017-07-22 MED ORDER — DEXMEDETOMIDINE HCL IN NACL 400 MCG/100ML IV SOLN
0.1000 ug/kg/h | INTRAVENOUS | Status: AC
  Administered 2017-07-23: .3 ug/kg/h via INTRAVENOUS
  Filled 2017-07-22: qty 100

## 2017-07-22 MED ORDER — SODIUM CHLORIDE 0.9 % IV SOLN
30.0000 ug/min | INTRAVENOUS | Status: AC
  Administered 2017-07-23: 20 ug/min via INTRAVENOUS
  Filled 2017-07-22: qty 2

## 2017-07-22 MED ORDER — NOREPINEPHRINE BITARTRATE 1 MG/ML IV SOLN
0.0000 ug/min | INTRAVENOUS | Status: AC
  Administered 2017-07-23: 2 ug/min via INTRAVENOUS
  Filled 2017-07-22: qty 4

## 2017-07-22 NOTE — Progress Notes (Signed)
Nutrition Follow-up  DOCUMENTATION CODES:   Non-severe (moderate) malnutrition in context of chronic illness  INTERVENTION:   -Continue Ensure Enlive po BID, each supplement provides 350 kcal and 20 grams of protein   NUTRITION DIAGNOSIS:   Malnutrition (Moderate) related to chronic illness (CHF, CAD) as evidenced by mild depletion of muscle mass, mild depletion of body fat.  Being addressed via oral nutrition supplements  GOAL:   Patient will meet greater than or equal to 90% of their needs  Progressing  MONITOR:   PO intake, Supplement acceptance, Labs, Weight trends, Diet advancement  REASON FOR ASSESSMENT:   Consult  (pt is edentulous)  ASSESSMENT:   76 yo male admitted with severe symptomatic aortic stenosis, CHF, AKI. Pt with hx of CHF, HTN. ECHO on admission with EF 20-25%  9/13 Thoracentesis with 2L removed from right pleural space 9/19 PCI with DEs of LAD 9/25 plan for TAVR, PleurX tube placement planned as well  Recorded po intake 75-100% of meals; pt reports drinking Ensure Enlive shakes as well  Pt reports no problems chewing current diet post dental extractions; noted MD documenting oozing in gums  Weight trend down since admission 69 kg to 63 kg.  Net negative 10 L since admission which likely explains weight loss.   Labs: reviewed Meds: lasix,  KCl  Diet Order:  Diet Heart Room service appropriate? Yes; Fluid consistency: Thin  Skin:  Reviewed, no issues  Last BM:  no recent BM  Height:   Ht Readings from Last 1 Encounters:  07/09/17 5\' 6"  (1.676 m)    Weight:   Wt Readings from Last 1 Encounters:  07/22/17 138 lb 6.4 oz (62.8 kg)    Ideal Body Weight:  64.5 kg  BMI:  Body mass index is 22.34 kg/m.  Estimated Nutritional Needs:   Kcal:  5956-3875 kcals  Protein:  88-98 g  Fluid:  >/= 1.8 L  EDUCATION NEEDS:   No education needs identified at this time  Combs, Ramey, LDN [phone] Pager  [phone]  Weekend/On-Call Pager

## 2017-07-22 NOTE — Progress Notes (Signed)
[place] VALVE TEAM  Patient Name: Edward Strickland Date of Encounter: 07/22/2017  Primary Cardiologist: Dr. Burt Knack- he may be able to establish in [facility] eventually  Hospital Problem List     Principal Problem:   Acute on chronic systolic CHF (congestive heart failure) ([place]) Active Problems:   Aortic stenosis   CKD (chronic kidney disease), stage III   Essential hypertension   COPD (chronic obstructive pulmonary disease) (HCC)   Pleural effusion   Coronary artery disease involving native coronary artery of native heart with unstable angina pectoris (HCC)     Subjective   No complaints.  Inpatient Medications    Scheduled Meds: . aspirin EC  81 mg Oral Daily  . atorvastatin  80 mg Oral q1800  . bacitracin  1 application Topical Y5W  . clopidogrel  75 mg Oral Daily  . feeding supplement (ENSURE ENLIVE)  237 mL Oral BID BM  . furosemide  40 mg Oral Daily  . potassium chloride  20 mEq Oral Daily  . sodium chloride flush  3 mL Intravenous Q12H  . sodium chloride flush  3 mL Intravenous Q12H   Continuous Infusions: . sodium chloride    . sodium chloride    . lactated ringers 10 mL/hr at 07/15/17 1451   PRN Meds: sodium chloride, sodium chloride, acetaminophen **OR** acetaminophen, aminocaproic acid, lidocaine (PF), magnesium hydroxide, ondansetron **OR** ondansetron (ZOFRAN) IV, oxyCODONE-acetaminophen, sodium chloride flush, sodium chloride flush   Vital Signs    Vitals:   07/21/17 1250 07/21/17 2039 07/22/17 0555 07/22/17 0915  BP: 98/60 (!) 97/56 (!) 102/52 (!) 102/51  Pulse: 83 82 77 87  Resp: 18 16 18 17   Temp: 97.6 F (36.4 C) 98.7 F (37.1 C) 98 F (36.7 C) 98 F (36.7 C)  TempSrc: Oral Oral Oral Oral  SpO2: 98% 97% 97% 98%  Weight:   138 lb 6.4 oz (62.8 kg)   Height:        Intake/Output Summary (Last 24 hours) at 07/22/17 0937 Last data filed at 07/22/17 0900  Gross per 24 hour  Intake              700  ml  Output             1320 ml  Net             -620 ml   Filed Weights   07/20/17 0700 07/21/17 0631 07/22/17 0555  Weight: 141 lb 11.2 oz (64.3 kg) 139 lb 12.8 oz (63.4 kg) 138 lb 6.4 oz (62.8 kg)    Physical Exam   GEN: Well nourished, well developed, in no acute distress. Thin, frail appreaing HEENT: Grossly normal.  Neck: Supple, no JVD, carotid upstrokes delayed with bilateral bruits. no masses. Cardiac: RRR, 3/6 harsh late peaking systolic murmur at RUSB/LLSB. No rubs or gallops. No clubbing, cyanosis. No pretibial edema.  Radials/DP/PT 2+ and equal bilaterally.  Respiratory:  Respirations regular and unlabored, diminished breath sounds R> L, mild wheezing GI: Soft, nontender, nondistended, BS + x 4. MS: no deformity or atrophy. Skin: warm and dry, no rash. chronic stasis changes L>R Neuro:  Strength and sensation are intact. Psych: AAOx3.  Normal affect.  Labs    CBC  Recent Labs  07/22/17 0353  WBC 8.8  HGB 11.6*  HCT 36.2*  MCV 88.9  PLT 389   Basic Metabolic Panel  Recent Labs  07/21/17 0539 07/22/17 0353  NA 134* 135  K 4.2 4.4  CL 104 102  CO2 26 26  GLUCOSE 90 97  BUN 20 24*  CREATININE 0.96 1.03  CALCIUM 8.6* 8.8*   Liver Function Tests  Recent Labs  07/22/17 0353  AST 37  ALT 30  ALKPHOS 63  BILITOT 0.6  PROT 6.2*  ALBUMIN 2.8*   No results for input(s): LIPASE, AMYLASE in the last 72 hours. Cardiac Enzymes No results for input(s): CKTOTAL, CKMB, CKMBINDEX, TROPONINI in the last 72 hours. BNP Invalid input(s): POCBNP D-Dimer No results for input(s): DDIMER in the last 72 hours. Hemoglobin A1C No results for input(s): HGBA1C in the last 72 hours. Fasting Lipid Panel No results for input(s): CHOL, HDL, LDLCALC, TRIG, CHOLHDL, LDLDIRECT in the last 72 hours. Thyroid Function Tests No results for input(s): TSH, T4TOTAL, T3FREE, THYROIDAB in the last 72 hours.  Invalid input(s): FREET3  Telemetry    NSR - Personally  Reviewed  ECG    sinus with 1st deg AV block, LVH with repol abnormality, HR 74 - Personally Reviewed  Radiology    Dg Chest 2 View  Result Date: 07/22/2017 CLINICAL DATA:  History of CHF.  No current complaints EXAM: CHEST  2 VIEW COMPARISON:  Chest x-ray of July 11, 2017 FINDINGS: The lungs are well-expanded. There is a moderate-sized right-sided pleural effusion and small left pleural effusion. The cardiac silhouette is mildly enlarged. The central pulmonary vascularity is prominent but there is no cephalization. There is calcification in the wall of the aortic arch. The mediastinum is normal in width. The bony thorax exhibits no acute abnormality. IMPRESSION: There is has been interval increase in the size of the right pleural effusion. There is a stable small left pleural effusion. Stable cardiomegaly without significant pulmonary vascular congestion. Thoracic aortic atherosclerosis. Electronically Signed   By: David  Martinique M.D.   On: 07/22/2017 07:20    Cardiac Studies   2D ECHO: 07/09/2017 LV EF: 25% -   30% Study Conclusion - Left ventricle: The cavity size was mildly dilated. Wall   thickness was increased in a pattern of mild LVH. Systolic   function was severely reduced. The estimated ejection fraction   was in the range of 25% to 30%. Diffuse hypokinesis. - Aortic valve: Valve mobility was restricted. There was severe   stenosis. There was mild to moderate regurgitation. Valve area   (VTI): 0.41 cm^2. Valve area (Vmax): 0.35 cm^2. Valve area   (Vmean): 0.39 cm^2. - Left atrium: The atrium was moderately dilated. - Right ventricle: The cavity size was moderately dilated. Systolic   function was moderately reduced. - Right atrium: The atrium was moderately dilated. - Pulmonary arteries: Systolic pressure was severely increased. PA   peak pressure: 79 mm Hg (S). - Pericardium, extracardiac: There was a left pleural effusion. Impressions: - Severe global reduction in  LV systolic function; mild LVE and   LVH; calcified aortic valve with severe AS and mild to moderate   AI; moderate LAE; moderate RVE with moderately reduced function;   mild TR with severely elevated pulmonary pressure.   07/10/17 RIGHT HEART CATH AND CORONARY ANGIOGRAPHY  Conclusion  1. Severe single-vessel coronary artery disease with severe stenosis in the mid LAD and otherwise mild diffuse nonobstructive calcific coronary disease 2. Known severe aortic stenosis with very heavy calcification of the aortic valve on plain fluoroscopy and severely restricted aortic leaflet mobility 3. Severe pulmonary hypertension likely secondary to left heart disease with severely elevated pulmonary capillary wedge pressure  Recommendation: Continued evaluation by the multidisciplinary heart  valve team. Likely proceed with PCI of the LAD followed by TAVR pending cardiac surgical consultation    Cardiac CT 07/12/17 IMPRESSION: 1. Trileaflet aortic valve with heavily calcified leaflets and severe leaflet opening restriction. There are significant asymmetric calcifications extending into the LVOT under the non-coronary leaflet. Annular measurements suitable for delivery of a 29 mm Edwards-SAPIEN valve. 2. Sufficient annulus to coronary distance. 3. Optimum Fluoroscopic Angle for Delivery:  LAU 7 CAU 7. 4. No Thrombus in the left atrial appendage. 5. Dilated pulmonary artery measuring 33 x 32 mm suggestive of pulmonary hypertension.   Carotid doppler 07/14/17 Summary: Bilateral: mild soft plaque CCA. Mild mixed plaque origin and proximal ICA. 1-39% ICA plaquing. vertebral artery flow is antegrade.   CT angio abdomen/pelvis/chest: 07/12/17 IMPRESSION: 1. Vascular findings and measurements pertinent to potential TAVR procedure, as detailed above. This patient does have suitable pelvic arterial access bilaterally. 2. Severe thickening calcification of the aortic valve, compatible with the  reported clinical history of severe aortic stenosis. 3. Findings in the thorax suggestive of underlying congestive heart failure, including moderate pulmonary edema, moderate right and small a moderate left pleural effusions, and cardiomegaly. 4. Aortic atherosclerosis, in addition to left anterior descending coronary artery disease. Assessment for potential risk factor modification, dietary therapy or pharmacologic therapy may be warranted, if clinically indicated. 5. Colonic diverticulosis without evidence to suggest an acute diverticulitis at this time. 6. Additional incidental findings, as above. Aortic Atherosclerosis (ICD10-I70.0).   07/14/17 CORONARY STENT INTERVENTION  Conclusion   1. Severe stenosis mid LAD 2. Successful PTCA/DES x 1 mid LAD  Recommendations: Will continue DAPT with ASA and Plavix for at least 3 months but longer if he tolerates. Continue planning for TAVR.      Patient Profile     Edward Strickland is a 76 y.o. male with a hx of HTN, aortic stenosis, and COPD who was transferred to [facility] from [place] on 07/09/17 for further work up of his severe AS and new systolic CHF.   Assessment & Plan    Severe symptomatic stage D AS: echo from [facility] showed severe aortic stenosis with peak gradient of 89 mmHg and mean transvalvular gradient of 51 mmHg. Repeat echo here shows the aortic valve is severely calcified and markedly restricted. There is at least mild aortic insufficiency. DVI 0.16. Undergoing pre TAVR work up. CTs show anatomy suitable for an Edwards 27mm valve and transfemoral access. TAVR is planned for tomorrow Tuesday 07/23/17.  New systolic CHF: EF 16-07% by echo. Likely LV dilation 2/2 severe AS. He was diuresed IV lasix and then transitioned to PO lasix. Net neg 10.5L. Weight down 12 lbs ( 150--> 138). Now euvolemic.  CAD: cath 9/12 showed 90% mLAD stenosis. He underwent PCI/DEs of the LAD on 9/19. Continue ASA/plavix and atorvastatin 80mg  daily. BB  discontinued 2/2 soft BPs  Right pleural effusion: s/p IR thoracentesis 9/13 which drained 2 L from the right pleural space. CXR today with interval increase in the size of the right pleural effusion. Will add PleurX tube a the time of surgery.   AKI: creat now stablazied.   COPD: PFTS showed severe obstruction   HTN: BP soft but stable  Dental: s/p multiple dental extractions 9/17 by Dr. Enrique Sack. Has had some oozing in gums   Dispo: he does not have a lot of social support and will likely need short term placement in a SNF at discharge. He has refused home health services. PT consulted given prolonged hospitalization  Signed, Angelena Form,  PA-C  07/22/2017, 9:37 AM  Pager [phone]  I have seen and examined the patient and agree with the assessment and plan as outlined.  I have again discussed the indications, risks and potential benefits of TAVR with the patient at the bedside.  Expectations for his postoperative convalescence have been discussed, including the fact that he will need short term placement in SNF for rehab.  We also discussed the indications, risks and potential benefits of placement of right Pleur-X catheter at the time of surgery to treat his right pleural effusion which has reaccumulated since he underwent thoracentesis.  All questions answered.  For OR tomorrow.  I spent in excess of 15 minutes during the conduct of this hospital encounter and >50% of this time involved direct face-to-face encounter with the patient for counseling and/or coordination of their care.   Rexene Alberts, MD 07/22/2017 10:06 AM
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
## 2017-07-22 NOTE — Anesthesia Preprocedure Evaluation (Addendum)
Anesthesia Evaluation  Patient identified by MRN, date of birth, ID band Patient awake    Reviewed: Allergy & Precautions, NPO status , Patient's Chart, lab work & pertinent test results  History of Anesthesia Complications Negative for: history of anesthetic complications  Airway Mallampati: II  TM Distance: <3 FB Neck ROM: Full    Dental  (+) Edentulous Upper, Edentulous Lower   Pulmonary COPD, former smoker,    breath sounds clear to auscultation       Cardiovascular hypertension, + angina with exertion +CHF  + Valvular Problems/Murmurs AS  Rhythm:Regular Rate:Normal + Systolic murmurs Severe AS and low EF , also c severe Pulm HTN   Neuro/Psych    GI/Hepatic negative GI ROS, Neg liver ROS,   Endo/Other  negative endocrine ROS  Renal/GU Renal disease     Musculoskeletal   Abdominal   Peds  Hematology   Anesthesia Other Findings   Reproductive/Obstetrics                            Anesthesia Physical Anesthesia Plan  ASA: IV  Anesthesia Plan: MAC   Post-op Pain Management:    Induction: Intravenous  PONV Risk Score and Plan: 2 and Ondansetron and Dexamethasone  Airway Management Planned: Natural Airway and Simple Face Mask  Additional Equipment: CVP and Arterial line  Intra-op Plan:   Post-operative Plan:   Informed Consent: I have reviewed the patients History and Physical, chart, labs and discussed the procedure including the risks, benefits and alternatives for the proposed anesthesia with the patient or authorized representative who has indicated his/her understanding and acceptance.     Plan Discussed with: CRNA  Anesthesia Plan Comments:        Anesthesia Quick Evaluation

## 2017-07-22 NOTE — Progress Notes (Signed)
CARDIAC REHAB PHASE I   PRE:  Rate/Rhythm: 74 SR 1HB  BP:  Supine:   Sitting: 104/48  Standing:    SaO2: 100%RA  MODE:  Ambulation: 470 ft   POST:  Rate/Rhythm: 110 ST  BP:  Supine:   Sitting: 120/84  Standing:    SaO2: 98%RA 1405-1438 Pt able to stand with very little assistance. He walked 470 ft on RA with rolling walker and minimal asst. To bathroom after walk. Helped pt get cleaned up from BM. To recliner with call bell. Tolerated well.   Graylon Good, RN BSN  07/22/2017 2:35 PM

## 2017-07-23 ENCOUNTER — Inpatient Hospital Stay (HOSPITAL_COMMUNITY): Payer: Medicare Other

## 2017-07-23 ENCOUNTER — Encounter (HOSPITAL_COMMUNITY)
Admission: AD | Disposition: A | Discharge status: Skilled Nursing Facility | Payer: Self-pay | Source: Other Acute Inpatient Hospital | Attending: Cardiovascular Disease

## 2017-07-23 ENCOUNTER — Inpatient Hospital Stay (HOSPITAL_COMMUNITY): Payer: Medicare Other | Admitting: Certified Registered"

## 2017-07-23 ENCOUNTER — Encounter (HOSPITAL_COMMUNITY): Payer: Self-pay | Admitting: Certified Registered"

## 2017-07-23 DIAGNOSIS — I350 Nonrheumatic aortic (valve) stenosis: Secondary | ICD-10-CM

## 2017-07-23 DIAGNOSIS — Z006 Encounter for examination for normal comparison and control in clinical research program: Secondary | ICD-10-CM

## 2017-07-23 DIAGNOSIS — Z952 Presence of prosthetic heart valve: Secondary | ICD-10-CM

## 2017-07-23 HISTORY — PX: TRANSCATHETER AORTIC VALVE REPLACEMENT, TRANSFEMORAL: SHX6400

## 2017-07-23 HISTORY — PX: CHEST TUBE INSERTION: SHX231

## 2017-07-23 HISTORY — DX: Presence of prosthetic heart valve: Z95.2

## 2017-07-23 HISTORY — PX: TEE WITHOUT CARDIOVERSION: SHX5443

## 2017-07-23 LAB — PROTIME-INR
INR: 1.36
PROTHROMBIN TIME: 16.7 s — AB (ref 11.4–15.2)

## 2017-07-23 LAB — POCT I-STAT 3, ART BLOOD GAS (G3+)
ACID-BASE EXCESS: 4 mmol/L — AB (ref 0.0–2.0)
Acid-Base Excess: 2 mmol/L (ref 0.0–2.0)
Acid-Base Excess: 3 mmol/L — ABNORMAL HIGH (ref 0.0–2.0)
BICARBONATE: 27.9 mmol/L (ref 20.0–28.0)
BICARBONATE: 29.4 mmol/L — AB (ref 20.0–28.0)
Bicarbonate: 28.7 mmol/L — ABNORMAL HIGH (ref 20.0–28.0)
O2 SAT: 100 %
O2 SAT: 100 %
O2 Saturation: 100 %
PCO2 ART: 45.3 mmHg (ref 32.0–48.0)
PCO2 ART: 50.7 mmHg — AB (ref 32.0–48.0)
PH ART: 7.407 (ref 7.350–7.450)
PH ART: 7.36 (ref 7.350–7.450)
PO2 ART: 219 mmHg — AB (ref 83.0–108.0)
PO2 ART: 396 mmHg — AB (ref 83.0–108.0)
Patient temperature: 35.6
TCO2: 29 mmol/L (ref 22–32)
TCO2: 31 mmol/L (ref 22–32)
TCO2: 30 mmol/L (ref 22–32)
pCO2 arterial: 46.3 mmHg (ref 32.0–48.0)
pH, Arterial: 7.391 (ref 7.350–7.450)
pO2, Arterial: 193 mmHg — ABNORMAL HIGH (ref 83.0–108.0)

## 2017-07-23 LAB — CBC
HCT: 28.8 % — ABNORMAL LOW (ref 39.0–52.0)
Hemoglobin: 9.5 g/dL — ABNORMAL LOW (ref 13.0–17.0)
MCH: 29.1 pg (ref 26.0–34.0)
MCHC: 33 g/dL (ref 30.0–36.0)
MCV: 88.1 fL (ref 78.0–100.0)
PLATELETS: 188 10*3/uL (ref 150–400)
RBC: 3.27 MIL/uL — AB (ref 4.22–5.81)
RDW: 14.8 % (ref 11.5–15.5)
WBC: 9.7 10*3/uL (ref 4.0–10.5)

## 2017-07-23 LAB — POCT I-STAT, CHEM 8
BUN: 25 mg/dL — ABNORMAL HIGH (ref 6–20)
BUN: 22 mg/dL — AB (ref 6–20)
CALCIUM ION: 1.19 mmol/L (ref 1.15–1.40)
CHLORIDE: 101 mmol/L (ref 101–111)
CHLORIDE: 100 mmol/L — AB (ref 101–111)
Calcium, Ion: 1.19 mmol/L (ref 1.15–1.40)
Creatinine, Ser: 1 mg/dL (ref 0.61–1.24)
Creatinine, Ser: 0.9 mg/dL (ref 0.61–1.24)
Glucose, Bld: 112 mg/dL — ABNORMAL HIGH (ref 65–99)
Glucose, Bld: 117 mg/dL — ABNORMAL HIGH (ref 65–99)
HEMATOCRIT: 36 % — AB (ref 39.0–52.0)
HEMATOCRIT: 29 % — AB (ref 39.0–52.0)
HEMOGLOBIN: 12.2 g/dL — AB (ref 13.0–17.0)
Hemoglobin: 9.9 g/dL — ABNORMAL LOW (ref 13.0–17.0)
POTASSIUM: 4.4 mmol/L (ref 3.5–5.1)
POTASSIUM: 4.2 mmol/L (ref 3.5–5.1)
SODIUM: 136 mmol/L (ref 135–145)
SODIUM: 136 mmol/L (ref 135–145)
TCO2: 25 mmol/L (ref 22–32)
TCO2: 27 mmol/L (ref 22–32)

## 2017-07-23 LAB — GLUCOSE, CAPILLARY
GLUCOSE-CAPILLARY: 116 mg/dL — AB (ref 65–99)
GLUCOSE-CAPILLARY: 109 mg/dL — AB (ref 65–99)

## 2017-07-23 LAB — POCT I-STAT 4, (NA,K, GLUC, HGB,HCT)
Glucose, Bld: 124 mg/dL — ABNORMAL HIGH (ref 65–99)
HCT: 28 % — ABNORMAL LOW (ref 39.0–52.0)
Hemoglobin: 9.5 g/dL — ABNORMAL LOW (ref 13.0–17.0)
POTASSIUM: 4.2 mmol/L (ref 3.5–5.1)
SODIUM: 137 mmol/L (ref 135–145)

## 2017-07-23 LAB — BASIC METABOLIC PANEL
ANION GAP: 7 (ref 5–15)
BUN: 24 mg/dL — ABNORMAL HIGH (ref 6–20)
CALCIUM: 8.8 mg/dL — AB (ref 8.9–10.3)
CO2: 27 mmol/L (ref 22–32)
Chloride: 100 mmol/L — ABNORMAL LOW (ref 101–111)
Creatinine, Ser: 0.97 mg/dL (ref 0.61–1.24)
GFR calc Af Amer: >60 mL/min (ref >60)
GLUCOSE: 94 mg/dL (ref 65–99)
Potassium: 4.2 mmol/L (ref 3.5–5.1)
Sodium: 134 mmol/L — ABNORMAL LOW (ref 135–145)

## 2017-07-23 LAB — PREPARE RBC (CROSSMATCH)

## 2017-07-23 LAB — APTT: aPTT: 35 seconds (ref 24–36)

## 2017-07-23 SURGERY — IMPLANTATION, AORTIC VALVE, TRANSCATHETER, FEMORAL APPROACH
Anesthesia: Monitor Anesthesia Care | Site: Chest | Laterality: Right

## 2017-07-23 MED ORDER — NITROGLYCERIN IN D5W 200-5 MCG/ML-% IV SOLN: 0.0000 ug/min | INTRAVENOUS | Status: DC

## 2017-07-23 MED ORDER — LIDOCAINE 2% (20 MG/ML) 5 ML SYRINGE
INTRAMUSCULAR | Status: DC | PRN
  Administered 2017-07-23: 80 mg via INTRAVENOUS

## 2017-07-23 MED ORDER — FENTANYL CITRATE (PF) 250 MCG/5ML IJ SOLN
INTRAMUSCULAR | Status: AC
  Filled 2017-07-23: qty 5

## 2017-07-23 MED ORDER — METOPROLOL TARTRATE 5 MG/5ML IV SOLN
2.5000 mg | INTRAVENOUS | Status: DC | PRN
Start: 2017-07-23 — End: 2017-07-26

## 2017-07-23 MED ORDER — LACTATED RINGERS IV SOLN
INTRAVENOUS | Status: DC | PRN
  Administered 2017-07-23: 08:00:00 via INTRAVENOUS

## 2017-07-23 MED ORDER — FENTANYL CITRATE (PF) 100 MCG/2ML IJ SOLN
INTRAMUSCULAR | Status: DC | PRN
  Administered 2017-07-23: 25 ug via INTRAVENOUS
  Administered 2017-07-23: 50 ug via INTRAVENOUS
  Administered 2017-07-23 (×2): 25 ug via INTRAVENOUS
  Administered 2017-07-23: 50 ug via INTRAVENOUS

## 2017-07-23 MED ORDER — MORPHINE SULFATE (PF) 4 MG/ML IV SOLN: 1.0000 mg | INTRAVENOUS | Status: AC | PRN

## 2017-07-23 MED ORDER — ROCURONIUM BROMIDE 10 MG/ML (PF) SYRINGE
PREFILLED_SYRINGE | INTRAVENOUS | Status: AC
  Filled 2017-07-23: qty 5

## 2017-07-23 MED ORDER — ACETAMINOPHEN 160 MG/5ML PO SOLN: 1000.0000 mg | Freq: Four times a day (QID) | ORAL | Status: DC

## 2017-07-23 MED ORDER — ROCURONIUM BROMIDE 10 MG/ML (PF) SYRINGE
PREFILLED_SYRINGE | INTRAVENOUS | Status: DC | PRN
  Administered 2017-07-23 (×2): 20 mg via INTRAVENOUS
  Administered 2017-07-23: 50 mg via INTRAVENOUS

## 2017-07-23 MED ORDER — 0.9 % SODIUM CHLORIDE (POUR BTL) OPTIME
TOPICAL | Status: DC | PRN
  Administered 2017-07-23: 5000 mL

## 2017-07-23 MED ORDER — LACTATED RINGERS IV SOLN
INTRAVENOUS | Status: DC | PRN
  Administered 2017-07-23: 07:00:00 via INTRAVENOUS

## 2017-07-23 MED ORDER — DEXTROSE 5 % IV SOLN
1.5000 g | Freq: Two times a day (BID) | INTRAVENOUS | Status: AC
  Administered 2017-07-23 – 2017-07-25 (×4): 1.5 g via INTRAVENOUS
  Filled 2017-07-23 (×5): qty 1.5

## 2017-07-23 MED ORDER — SODIUM CHLORIDE 0.9 % IV SOLN
INTRAVENOUS | Status: DC | PRN
  Administered 2017-07-23: 09:00:00 1500 mL

## 2017-07-23 MED ORDER — MORPHINE SULFATE (PF) 4 MG/ML IV SOLN: 2.0000 mg | INTRAVENOUS | Status: DC | PRN

## 2017-07-23 MED ORDER — ALBUMIN HUMAN 5 % IV SOLN: 250.0000 mL | INTRAVENOUS | Status: AC | PRN

## 2017-07-23 MED ORDER — MIDAZOLAM HCL 2 MG/2ML IJ SOLN
INTRAMUSCULAR | Status: AC
  Filled 2017-07-23: qty 2

## 2017-07-23 MED ORDER — HEMOSTATIC AGENTS (NO CHARGE) OPTIME
TOPICAL | Status: DC | PRN
  Administered 2017-07-23: 1 via TOPICAL

## 2017-07-23 MED ORDER — IODIXANOL 320 MG/ML IV SOLN
INTRAVENOUS | Status: DC | PRN
  Administered 2017-07-23: 50.3 mL via INTRA_ARTERIAL

## 2017-07-23 MED ORDER — DEXMEDETOMIDINE HCL IN NACL 200 MCG/50ML IV SOLN
0.1000 ug/kg/h | INTRAVENOUS | Status: DC
  Filled 2017-07-23: qty 50

## 2017-07-23 MED ORDER — VANCOMYCIN HCL IN DEXTROSE 1-5 GM/200ML-% IV SOLN
1000.0000 mg | Freq: Once | INTRAVENOUS | Status: AC
  Administered 2017-07-23: 1000 mg via INTRAVENOUS
  Filled 2017-07-23: qty 200

## 2017-07-23 MED ORDER — LACTATED RINGERS IV SOLN: 500.0000 mL | Freq: Once | INTRAVENOUS | Status: DC | PRN

## 2017-07-23 MED ORDER — PROTAMINE SULFATE 10 MG/ML IV SOLN
INTRAVENOUS | Status: DC | PRN
  Administered 2017-07-23: 30 mg via INTRAVENOUS
  Administered 2017-07-23: 10 mg via INTRAVENOUS
  Administered 2017-07-23: 30 mg via INTRAVENOUS
  Administered 2017-07-23: 20 mg via INTRAVENOUS

## 2017-07-23 MED ORDER — SODIUM CHLORIDE 0.9 % IV SOLN: 0.1000 ug/kg/h | INTRAVENOUS | Status: DC

## 2017-07-23 MED ORDER — SODIUM CHLORIDE 0.9 % IV SOLN
INTRAVENOUS | Status: AC
  Administered 2017-07-23: 12:00:00 via INTRAVENOUS

## 2017-07-23 MED ORDER — OXYCODONE HCL 5 MG PO TABS: 5.0000 mg | ORAL_TABLET | ORAL | Status: DC | PRN

## 2017-07-23 MED ORDER — SUCCINYLCHOLINE CHLORIDE 20 MG/ML IJ SOLN
INTRAMUSCULAR | Status: DC | PRN
  Administered 2017-07-23: 100 mg via INTRAVENOUS

## 2017-07-23 MED ORDER — PANTOPRAZOLE SODIUM 40 MG PO TBEC
40.0000 mg | DELAYED_RELEASE_TABLET | Freq: Every day | ORAL | Status: DC
  Administered 2017-07-25 – 2017-07-26 (×2): 40 mg via ORAL
  Filled 2017-07-23 (×2): qty 1

## 2017-07-23 MED ORDER — MIDAZOLAM HCL 2 MG/2ML IJ SOLN
2.0000 mg | INTRAMUSCULAR | Status: DC | PRN
  Administered 2017-07-23: 2 mg via INTRAVENOUS
  Filled 2017-07-23: qty 2

## 2017-07-23 MED ORDER — LIDOCAINE 2% (20 MG/ML) 5 ML SYRINGE
INTRAMUSCULAR | Status: AC
  Filled 2017-07-23: qty 5

## 2017-07-23 MED ORDER — ONDANSETRON HCL 4 MG/2ML IJ SOLN
INTRAMUSCULAR | Status: DC | PRN
  Administered 2017-07-23: 4 mg via INTRAVENOUS

## 2017-07-23 MED ORDER — ACETAMINOPHEN 500 MG PO TABS
1000.0000 mg | ORAL_TABLET | Freq: Four times a day (QID) | ORAL | Status: DC
  Administered 2017-07-23 – 2017-07-26 (×9): 1000 mg via ORAL
  Filled 2017-07-23 (×9): qty 2

## 2017-07-23 MED ORDER — HEPARIN SODIUM (PORCINE) 1000 UNIT/ML IJ SOLN
INTRAMUSCULAR | Status: DC | PRN
  Administered 2017-07-23: 9000 [IU] via INTRAVENOUS

## 2017-07-23 MED ORDER — PROPOFOL 10 MG/ML IV BOLUS
INTRAVENOUS | Status: AC
  Filled 2017-07-23: qty 20

## 2017-07-23 MED ORDER — ONDANSETRON HCL 4 MG/2ML IJ SOLN: 4.0000 mg | Freq: Four times a day (QID) | INTRAMUSCULAR | Status: DC | PRN

## 2017-07-23 MED ORDER — ONDANSETRON HCL 4 MG/2ML IJ SOLN
INTRAMUSCULAR | Status: AC
  Filled 2017-07-23: qty 2

## 2017-07-23 MED ORDER — ACETAMINOPHEN 160 MG/5ML PO SOLN: 650.0000 mg | Freq: Once | ORAL | Status: DC

## 2017-07-23 MED ORDER — FAMOTIDINE IN NACL 20-0.9 MG/50ML-% IV SOLN
20.0000 mg | Freq: Two times a day (BID) | INTRAVENOUS | Status: AC
  Administered 2017-07-23 (×2): 20 mg via INTRAVENOUS
  Filled 2017-07-23 (×3): qty 50

## 2017-07-23 MED ORDER — PROPOFOL 10 MG/ML IV BOLUS
INTRAVENOUS | Status: DC | PRN
  Administered 2017-07-23: 90 mg via INTRAVENOUS

## 2017-07-23 MED ORDER — NOREPINEPHRINE BITARTRATE 1 MG/ML IV SOLN
0.0000 ug/min | INTRAVENOUS | Status: DC
  Administered 2017-07-24: 6 ug/min via INTRAVENOUS
  Filled 2017-07-23 (×2): qty 4

## 2017-07-23 MED ORDER — FENTANYL CITRATE (PF) 100 MCG/2ML IJ SOLN: INTRAMUSCULAR | Status: DC | PRN

## 2017-07-23 MED ORDER — ACETAMINOPHEN 650 MG RE SUPP: 650.0000 mg | Freq: Once | RECTAL | Status: DC

## 2017-07-23 MED ORDER — THROMBIN 5000 UNITS EX SOLR
CUTANEOUS | Status: AC
  Filled 2017-07-23: qty 5000

## 2017-07-23 MED ORDER — CHLORHEXIDINE GLUCONATE 0.12 % MT SOLN
15.0000 mL | OROMUCOSAL | Status: AC
  Administered 2017-07-23: 15 mL via OROMUCOSAL

## 2017-07-23 MED ORDER — SUCCINYLCHOLINE CHLORIDE 200 MG/10ML IV SOSY
PREFILLED_SYRINGE | INTRAVENOUS | Status: AC
  Filled 2017-07-23: qty 10

## 2017-07-23 MED ORDER — TRAMADOL HCL 50 MG PO TABS: 50.0000 mg | ORAL_TABLET | ORAL | Status: DC | PRN

## 2017-07-23 MED ORDER — SODIUM CHLORIDE 0.9 % IV SOLN
0.0000 ug/min | INTRAVENOUS | Status: DC
  Filled 2017-07-23: qty 2

## 2017-07-23 SURGICAL SUPPLY — 112 items
ADAPTER UNIV SWAN GANZ BIP (ADAPTER) ×2 IMPLANT
ADAPTER UNV SWAN GANZ BIP (ADAPTER) ×2
BAG BANDED W/RUBBER/TAPE 36X54 (MISCELLANEOUS) ×4 IMPLANT
BAG DECANTER FOR FLEXI CONT (MISCELLANEOUS) ×4 IMPLANT
BAG SNAP BAND KOVER 36X36 (MISCELLANEOUS) ×8 IMPLANT
BLADE CLIPPER SURG (BLADE) IMPLANT
BLADE OSCILLATING /SAGITTAL (BLADE) IMPLANT
BLADE STERNUM SYSTEM 6 (BLADE) ×4 IMPLANT
BRUSH SCRUB EZ PLAIN DRY (MISCELLANEOUS) ×8 IMPLANT
CABLE ADAPT CONN TEMP 6FT (ADAPTER) ×4 IMPLANT
CABLE PACING FASLOC BIEGE (MISCELLANEOUS) ×4 IMPLANT
CABLE PACING FASLOC BLUE (MISCELLANEOUS) ×4 IMPLANT
CANISTER SUCT 3000ML PPV (MISCELLANEOUS) ×4 IMPLANT
CANNULA FEM VENOUS REMOTE 22FR (CANNULA) IMPLANT
CANNULA OPTISITE PERFUSION 16F (CANNULA) IMPLANT
CANNULA OPTISITE PERFUSION 18F (CANNULA) IMPLANT
CATH DIAG EXPO 6F VENT PIG 145 (CATHETERS) ×8 IMPLANT
CATH EXPO 5FR AL1 (CATHETERS) ×4 IMPLANT
CATH S G BIP PACING (SET/KITS/TRAYS/PACK) ×8 IMPLANT
CLIP VESOCCLUDE MED 24/CT (CLIP) ×4 IMPLANT
CLIP VESOCCLUDE SM WIDE 24/CT (CLIP) ×4 IMPLANT
CONT SPEC 4OZ CLIKSEAL STRL BL (MISCELLANEOUS) ×24 IMPLANT
COVER BACK TABLE 24X17X13 BIG (DRAPES) ×4 IMPLANT
COVER BACK TABLE 60X90IN (DRAPES) ×4 IMPLANT
COVER BACK TABLE 80X110 HD (DRAPES) ×4 IMPLANT
COVER DOME SNAP 22 D (MISCELLANEOUS) ×4 IMPLANT
COVER MAYO STAND STRL (DRAPES) ×4 IMPLANT
COVER PROBE W GEL 5X96 (DRAPES) ×4 IMPLANT
COVER SURGICAL LIGHT HANDLE (MISCELLANEOUS) ×4 IMPLANT
CRADLE DONUT ADULT HEAD (MISCELLANEOUS) ×4 IMPLANT
DERMABOND ADVANCED (GAUZE/BANDAGES/DRESSINGS) ×2
DERMABOND ADVANCED .7 DNX12 (GAUZE/BANDAGES/DRESSINGS) ×2 IMPLANT
DEVICE CLOSURE PERCLS PRGLD 6F (VASCULAR PRODUCTS) ×4 IMPLANT
DRAPE C-ARM 42X72 X-RAY (DRAPES) ×4 IMPLANT
DRAPE INCISE IOBAN 66X45 STRL (DRAPES) IMPLANT
DRAPE LAPAROSCOPIC ABDOMINAL (DRAPES) ×4 IMPLANT
DRAPE SLUSH MACHINE 52X66 (DRAPES) ×4 IMPLANT
DRSG TEGADERM 4X4.75 (GAUZE/BANDAGES/DRESSINGS) ×12 IMPLANT
ELECT REM PT RETURN 9FT ADLT (ELECTROSURGICAL) ×8
ELECTRODE REM PT RTRN 9FT ADLT (ELECTROSURGICAL) ×4 IMPLANT
FELT TEFLON 6X6 (MISCELLANEOUS) ×4 IMPLANT
FEMORAL VENOUS CANN RAP (CANNULA) IMPLANT
GAUZE SPONGE 4X4 12PLY STRL (GAUZE/BANDAGES/DRESSINGS) ×4 IMPLANT
GAUZE SPONGE 4X4 12PLY STRL LF (GAUZE/BANDAGES/DRESSINGS) ×12 IMPLANT
GLOVE BIO SURGEON STRL SZ8 (GLOVE) ×8 IMPLANT
GLOVE EUDERMIC 7 POWDERFREE (GLOVE) ×8 IMPLANT
GLOVE ORTHO TXT STRL SZ7.5 (GLOVE) ×12 IMPLANT
GOWN STRL REUS W/ TWL LRG LVL3 (GOWN DISPOSABLE) ×10 IMPLANT
GOWN STRL REUS W/ TWL XL LVL3 (GOWN DISPOSABLE) ×12 IMPLANT
GOWN STRL REUS W/TWL LRG LVL3 (GOWN DISPOSABLE) ×10
GOWN STRL REUS W/TWL XL LVL3 (GOWN DISPOSABLE) ×12
GUIDEWIRE SAF TJ AMPL .035X180 (WIRE) ×4 IMPLANT
GUIDEWIRE SAFE TJ AMPLATZ EXST (WIRE) ×4 IMPLANT
GUIDEWIRE STRAIGHT .035 260CM (WIRE) ×4 IMPLANT
INSERT FOGARTY 61MM (MISCELLANEOUS) ×4 IMPLANT
INSERT FOGARTY SM (MISCELLANEOUS) ×8 IMPLANT
INSERT FOGARTY XLG (MISCELLANEOUS) IMPLANT
KIT BASIN OR (CUSTOM PROCEDURE TRAY) ×8 IMPLANT
KIT DILATOR VASC 18G NDL (KITS) IMPLANT
KIT HEART LEFT (KITS) ×4 IMPLANT
KIT PLEURX DRAIN CATH 1000ML (MISCELLANEOUS) ×4 IMPLANT
KIT PLEURX DRAIN CATH 15.5FR (DRAIN) ×4 IMPLANT
KIT ROOM TURNOVER OR (KITS) ×8 IMPLANT
KIT SUCTION CATH 14FR (SUCTIONS) ×8 IMPLANT
NEEDLE PERC 18GX7CM (NEEDLE) ×4 IMPLANT
NS IRRIG 1000ML POUR BTL (IV SOLUTION) ×16 IMPLANT
PACK AORTA (CUSTOM PROCEDURE TRAY) ×4 IMPLANT
PACK GENERAL/GYN (CUSTOM PROCEDURE TRAY) ×4 IMPLANT
PAD ARMBOARD 7.5X6 YLW CONV (MISCELLANEOUS) ×16 IMPLANT
PAD ELECT DEFIB RADIOL ZOLL (MISCELLANEOUS) ×4 IMPLANT
PERCLOSE PROGLIDE 6F (VASCULAR PRODUCTS) ×8
SET DRAINAGE LINE (MISCELLANEOUS) IMPLANT
SET MICROPUNCTURE 5F STIFF (MISCELLANEOUS) ×4 IMPLANT
SHEATH AVANTI 11CM 8FR (MISCELLANEOUS) ×4 IMPLANT
SHEATH PINNACLE 6F 10CM (SHEATH) ×8 IMPLANT
SLEEVE REPOSITIONING LENGTH 30 (MISCELLANEOUS) ×4 IMPLANT
SPONGE LAP 4X18 X RAY DECT (DISPOSABLE) ×4 IMPLANT
STOPCOCK MORSE 400PSI 3WAY (MISCELLANEOUS) ×24 IMPLANT
SUT CHROMIC 3 0 PS 2 (SUTURE) ×4 IMPLANT
SUT ETHIBOND X763 2 0 SH 1 (SUTURE) ×4 IMPLANT
SUT ETHILON 3 0 FSL (SUTURE) ×8 IMPLANT
SUT GORETEX CV 4 TH 22 36 (SUTURE) ×4 IMPLANT
SUT GORETEX CV4 TH-18 (SUTURE) ×12 IMPLANT
SUT GORETEX TH-18 36 INCH (SUTURE) ×8 IMPLANT
SUT MNCRL AB 3-0 PS2 18 (SUTURE) ×4 IMPLANT
SUT PROLENE 3 0 SH1 36 (SUTURE) IMPLANT
SUT PROLENE 4 0 RB 1 (SUTURE) ×2
SUT PROLENE 4-0 RB1 .5 CRCL 36 (SUTURE) ×2 IMPLANT
SUT PROLENE 5 0 C 1 36 (SUTURE) ×8 IMPLANT
SUT PROLENE 6 0 C 1 30 (SUTURE) ×8 IMPLANT
SUT SILK  1 MH (SUTURE) ×2
SUT SILK 1 MH (SUTURE) ×2 IMPLANT
SUT SILK 2 0 SH CR/8 (SUTURE) IMPLANT
SUT VIC AB 2-0 CT1 27 (SUTURE) ×2
SUT VIC AB 2-0 CT1 TAPERPNT 27 (SUTURE) ×2 IMPLANT
SUT VIC AB 2-0 CTX 36 (SUTURE) IMPLANT
SUT VIC AB 3-0 SH 8-18 (SUTURE) ×8 IMPLANT
SUT VIC AB 3-0 X1 27 (SUTURE) ×8 IMPLANT
SYR 10ML LL (SYRINGE) ×12 IMPLANT
SYR 30ML LL (SYRINGE) ×8 IMPLANT
SYR 50ML LL SCALE MARK (SYRINGE) ×4 IMPLANT
TOWEL OR 17X24 6PK STRL BLUE (TOWEL DISPOSABLE) ×4 IMPLANT
TOWEL OR 17X26 10 PK STRL BLUE (TOWEL DISPOSABLE) ×12 IMPLANT
TRANSDUCER W/STOPCOCK (MISCELLANEOUS) ×8 IMPLANT
TRAY FOLEY SILVER 16FR TEMP (SET/KITS/TRAYS/PACK) ×4 IMPLANT
TUBING HIGH PRESSURE 120CM (CONNECTOR) ×4 IMPLANT
VALVE HEART TRANSCATH SZ3 29MM (Prosthesis & Implant Heart) ×4 IMPLANT
VALVE REPLACEMENT CAP (MISCELLANEOUS) IMPLANT
WATER STERILE IRR 1000ML POUR (IV SOLUTION) ×4 IMPLANT
WIRE AMPLATZ SS-J .035X180CM (WIRE) ×4 IMPLANT
WIRE BENTSON .035X145CM (WIRE) ×4 IMPLANT
WIRE MINI STICK MAX (SHEATH) ×4 IMPLANT

## 2017-07-23 NOTE — Anesthesia Procedure Notes (Addendum)
Procedure Name: Intubation Date/Time: 07/23/2017 7:48 AM Performed by: Barrington Ellison Pre-anesthesia Checklist: Patient identified, Emergency Drugs available, Suction available and Patient being monitored Patient Re-evaluated:Patient Re-evaluated prior to induction Oxygen Delivery Method: Circle System Utilized Preoxygenation: Pre-oxygenation with 100% oxygen Induction Type: IV induction and Rapid sequence Ventilation: Mask ventilation without difficulty Laryngoscope Size: Mac and 3 Grade View: Grade I Tube type: Oral Tube size: 7.5 mm Number of attempts: 1 Airway Equipment and Method: Stylet and Oral airway Placement Confirmation: ETT inserted through vocal cords under direct vision,  positive ETCO2 and breath sounds checked- equal and bilateral Secured at: 21 cm Tube secured with: Tape Dental Injury: Teeth and Oropharynx as per pre-operative assessment  Comments: Gums actively bleeding prior to induction, RSI and suction used

## 2017-07-23 NOTE — Progress Notes (Addendum)
Patient prepped for surgery, call to OR. RN will go down with patient. Family called, will be at the hospital later.

## 2017-07-23 NOTE — Progress Notes (Signed)
*  PRELIMINARY RESULTS* Echocardiogram Echocardiogram Transesophageal has been performed.  Edward Strickland 07/23/2017, 9:54 AM

## 2017-07-23 NOTE — Care Management Note (Addendum)
Case Management Note Previous Note Created by Tomi Bamberger 07/18/17  Patient Details  Name: Edward Strickland MRN: [id number] Date of Birth: 11-08-1940  Subjective/Objective:   From home alone, s/p coronary stent intervention, will be on plavix, per pt eval rec HHPT,  NCM spoke with patient and offered choice, he stated "No, No, I do not want HH, I have a friend who is with me and will help me, and he then preceded to hand the NCM the phone, there was a lady on the phone who gave me [name] 's phone number cell [phone] and home is [phone].  She states she helps patient out a lot because he took care of her mother.   9/20 [name], BSN - he is s/p coronary stent intervention, he has severe AS and new syst CHF,planning for Valve surgery on 9/25.  PT will need to reassess him after surgery.                    Action/Plan: NCM will follow for dc needs.   Expected Discharge Date:  07/11/17               Expected Discharge Plan:  [place]  In-House Referral:     Discharge planning Services  CM Consult  Post Acute Care Choice:  Home Health Choice offered to:  Patient  DME Arranged:    DME Agency:     HH Arranged:  Patient Refused [place] Agency:     Status of Service:  In process, will continue to follow  If discussed at Long Length of Stay Meetings, dates discussed:    Additional Comments: CM clarified with Dr. Roxy Manns that discharge plan is for SNF - pt is not appropriate for LTACH.   Pt will need Plurex catheters and draining at discharge.  CSW following for SNF due to medical needs at discharge and lack of support that is required to discharge back to previous location.  Pt remains intubated - PT will re evaluate  Pt transferred to [place] today post TAVR.  CM verified that pts friend [name] will be able to house pt at discharge in a separate dwelling if he does not have health needs at discharge, pt was independent PTA and [place].  Pt receive SS and is able to secure food and gas " he mostly drives around in his car".  CM explained that pt will likely need [facility] and PT at discharge - Ms [name] voiced concerns with pts needs verses his living conditions and request SNF at discharge - CM will consult CSW however prior to TAVR pt was being recommended for HHPT.   CM received consult for LTACH and HH - CM contacted attending service to request clarity.   Maryclare Labrador, RN 07/23/2017, 3:48 PM

## 2017-07-23 NOTE — Progress Notes (Signed)
  [place] VALVE TEAM  Patient doing well s/p TAVR this morning. Now extubated. Patient hemodynamically stable. ECG with sinus bradycardia with first degree AV block. Tele with HR 40-60s. Groin sites stable.   Angelena Form PA-C  [facility]

## 2017-07-23 NOTE — Anesthesia Procedure Notes (Signed)
Arterial Line Insertion Start/End9/25/2018 6:45 AM, 07/23/2017 6:50 AM Performed by: Leda Gauze, Addison Freimuth E, CRNA  Lidocaine 1% used for infiltration and patient sedated Right, radial was placed Catheter size: 20 G Hand hygiene performed  and maximum sterile barriers used   Attempts: 1 Procedure performed without using ultrasound guided technique. Following insertion, dressing applied and Biopatch. Post procedure assessment: normal  Patient tolerated the procedure well with no immediate complications.

## 2017-07-23 NOTE — OR Nursing (Signed)
Moderate bleeding from gums where pt. Had previous dental extractions for pre-op workup before TAVR. Edward Strickland was placed to apply pressure with no progress. Pt. Still bleeding moderately from gums. Estimate 75cc of blood lost from gums. Dr. Enrique Sack was paged but I believe he may be on vacation. Dr. Roxy Manns placed Chromic stitch and applied surgiflo to affected gum area to maintain hemostasis before leaving OR.

## 2017-07-23 NOTE — Progress Notes (Signed)
CT surgery p.m. Rounds  Elderly patient alert and oriented after transcatheter aVR Postop norepinephrine being weaned with stable blood pressure Groin clean and dry

## 2017-07-23 NOTE — OR Nursing (Signed)
Pleurex catheter drained 1500cc of fluid before being capped.

## 2017-07-23 NOTE — Transfer of Care (Signed)
Immediate Anesthesia Transfer of Care Note  Patient: Talis Iwan  Procedure(s) Performed: Procedure(s): TRANSCATHETER AORTIC VALVE REPLACEMENT, TRANSFEMORAL (N/A) TRANSESOPHAGEAL ECHOCARDIOGRAM (TEE) (N/A) INSERTION PLEURAL DRAINAGE CATHETER (Right)  Patient Location: ICU  Anesthesia Type:General  Level of Consciousness: Patient remains intubated per anesthesia plan  Airway & Oxygen Therapy: Patient remains intubated per anesthesia plan and Patient placed on Ventilator (see vital sign flow sheet for setting)  Post-op Assessment: Report given to RN  Post vital signs: Reviewed and stable  Last Vitals:  Vitals:   07/23/17 0712 07/23/17 0858  BP:    Pulse: (!) 103 (!) 59  Resp: 18   Temp:    SpO2: 100%     Last Pain:  Vitals:   07/23/17 0419  TempSrc: Oral  PainSc:          Complications: No apparent anesthesia complications

## 2017-07-23 NOTE — Interval H&P Note (Signed)
History and Physical Interval Note:  07/23/2017 5:42 AM  Edward Strickland  has presented today for surgery, with the diagnosis of severe aortic stenosis  The various methods of treatment have been discussed with the patient and family. After consideration of risks, benefits and other options for treatment, the patient has consented to  Procedure(s): TRANSCATHETER AORTIC VALVE REPLACEMENT, TRANSFEMORAL (N/A) TRANSESOPHAGEAL ECHOCARDIOGRAM (TEE) (N/A) INSERTION PLEURAL DRAINAGE CATHETER (Right) as a surgical intervention .  The patient's history has been reviewed, patient examined, no change in status, stable for surgery.  I have reviewed the patient's chart and labs.  Questions were answered to the patient's satisfaction.     Rexene Alberts

## 2017-07-23 NOTE — Anesthesia Procedure Notes (Signed)
Central Venous Catheter Insertion Performed by: Nolon Nations, anesthesiologist Patient location: Pre-op. Preanesthetic checklist: patient identified, IV checked, site marked, risks and benefits discussed, surgical consent, monitors and equipment checked, pre-op evaluation, timeout performed and anesthesia consent Position: Trendelenburg Lidocaine 1% used for infiltration and patient sedated Hand hygiene performed , maximum sterile barriers used  and Seldinger technique used Catheter size: 8 Fr Total catheter length 16. Central line was placed.Double lumen Procedure performed using ultrasound guided technique. Ultrasound Notes:anatomy identified, needle tip was noted to be adjacent to the nerve/plexus identified, no ultrasound evidence of intravascular and/or intraneural injection and image(s) printed for medical record Attempts: 1 Following insertion, dressing applied, line sutured and Biopatch. Post procedure assessment: blood return through all ports, no air and free fluid flow  Patient tolerated the procedure well with no immediate complications.

## 2017-07-23 NOTE — Op Note (Signed)
[facility] AND [facility]   MULTIDISCIPLINARY HEART VALVE TEAM   TAVR OPERATIVE NOTE   Date of Procedure:  07/23/2017  Preoperative Diagnosis:   Severe Aortic Stenosis   Recurrent Right Pleural Effusion  Postoperative Diagnosis: Same   Procedure:    Transcatheter Aortic Valve Replacement - Percutaneous Right Transfemoral Approach  Edwards Sapien 3 THV (size 29 mm, model # 9600TFX, serial # [id number])   Placement of Right Pleur-X Catheter    Co-Surgeons:  Laiba Fuerte H. Roxy Manns, MD and Lauree Chandler, MD  Anesthesiologist:  Rica Koyanagi, MD  Echocardiographer:  Jenkins Rouge, MD  Pre-operative Echo Findings:  Severe aortic stenosis  Moderate aortic insufficiency  Severe left ventricular systolic dysfunction  Post-operative Echo Findings:  Moderate paravalvular leak  Unchanged left ventricular systolic function   BRIEF CLINICAL NOTE AND INDICATIONS FOR SURGERY  Patient is a 76 year old male with history of aortic stenosis, coronary artery disease, hypertension, and COPD referred for a second surgical opinion to discuss treatment options for management of severe symptomatic aortic stenosis. The patient reportedly presented to the emergency department at [facility] in [place] in 2016 with chest pain. Echocardiogram performed at that time revealed severe aortic stenosis with normal left ventricular systolic function. He was referred for cardiology consultation but apparently never followed up.  He presented acutely to [facility] 07/07/2017 with dyspnea on exertion and severe bilateral lower extremity edema. He was notably hypoxemic on presentation chest x-ray revealed congestive heart failure with bilateral pleural effusions, right greater than left. Troponin levels were minimally elevated and remained stable.  Transthoracic echocardiogram was performed demonstrating severe left ventricular systolic dysfunction with ejection fraction estimated 20-25%. There  was severe aortic stenosis with mean gradient estimated 51 mmHg and aortic valve area calculated 0.41 cm. There was mild mitral regurgitation and moderate to severe tricuspid regurgitation. The patient was treated with diuretics and transferred to [facility] for further management. He was seen in consultation by Dr. Burt Knack and repeat echocardiogram confirmed the presence of severe aortic stenosis with peak velocity across the aortic valve measured 4.1 m/s corresponding to mean transvalvular gradient estimated 36 mmHg. The DVI was 0.16 and aortic valve area calculated 0.41 cm.  There was severe left ventricular systolic dysfunction with ejection fraction estimated 25-30%. There was mild to moderate aortic insufficiency, mild mitral regurgitation, mild tricuspid regurgitation, and severe pulmonary hypertension.  Left and right heart catheterization was performed 07/10/2017. This revealed severe single-vessel coronary artery disease with long segment 90% stenosis of the mid left anterior descending coronary artery and there was otherwise mild nonobstructive coronary artery disease. There was severe pulmonary hypertension.  The following day the patient underwent right thoracentesis yielding 2 L of serous fluid.  The patient has been seen in consultation by Dr. Cyndia Bent and felt to be high risk for conventional surgical aortic valve replacement and coronary artery bypass grafting. The patient was seen in consultation by Dr. Enrique Sack and underwent dental extraction on 07/15/2017.   During the course of the patient's preoperative work up they have been evaluated comprehensively by a multidisciplinary team of specialists coordinated through the [facility] in the [place] [facility].  They have been demonstrated to suffer from symptomatic severe aortic stenosis as noted above. The patient has been counseled extensively as to the relative risks and benefits of  all options for the treatment of severe aortic stenosis including long term medical therapy, conventional surgery for aortic valve replacement, and transcatheter aortic valve replacement.  All  questions have been answered, and the patient provides full informed consent for the operation as described.   DETAILS OF THE OPERATIVE PROCEDURE  PREPARATION:    The patient is brought to the operating room on the above mentioned date and central monitoring was established by the anesthesia team including placement of a central venous line and radial arterial line. The patient is placed in the supine position on the operating table.  Intravenous antibiotics are administered. Plans were initially established to perform the procedure under conscious sedation, once the patient was in position on the operative table it became clear that he had considerable ongoing bleeding from his mouth related to recent dental extraction. Due to the volume of blood loss risk of aspiration, a decision was made to proceed with surgery under general anesthesia.    Baseline transesophageal echocardiogram was performed. The patient's chest, abdomen, both groins, and both lower extremities are prepared and draped in a sterile manner. A time out procedure is performed.   PERIPHERAL ACCESS:    Using the modified Seldinger technique, femoral arterial and venous access was obtained with placement of 6 Fr sheaths on the left side.  A pigtail diagnostic catheter was passed through the left arterial sheath under fluoroscopic guidance into the aortic root.  A temporary transvenous pacemaker catheter was passed through the left femoral venous sheath under fluoroscopic guidance into the right ventricle.  The pacemaker was tested to ensure stable lead placement and pacemaker capture. Aortic root angiography was performed in order to determine the optimal angiographic angle for valve deployment.   TRANSFEMORAL ACCESS:   Percutaneous  transfemoral access and sheath placement was performed using ultrasound guidance.  The right common femoral artery was cannulated using a micropuncture needle and appropriate location was verified using hand injection angiogram.  A pair of Abbott Perclose percutaneous closure devices were placed and a 6 French sheath replaced into the femoral artery.  The patient was heparinized systemically and ACT verified > 250 seconds.    A 16 Fr transfemoral E-sheath was introduced into the right common femoral artery after progressively dilating over an Amplatz superstiff wire. An AL-2 catheter was used to direct a straight-tip exchange length wire across the native aortic valve into the left ventricle. This was exchanged out for a pigtail catheter and position was confirmed in the LV apex. Simultaneous LV and Ao pressures were recorded.  The pigtail catheter was exchanged for an Amplatz Extra-stiff wire in the LV apex.  Echocardiography was utilized to confirm appropriate wire position and no sign of entanglement in the mitral subvalvular apparatus.   BALLOON AORTIC VALVULOPLASTY:   Balloon aortic valvuloplasty was performed using a 23 mm valvuloplasty balloon.  Once optimal position was achieved, BAV was done under rapid ventricular pacing. The patient recovered well hemodynamically.    TRANSCATHETER HEART VALVE DEPLOYMENT:   An Edwards Sapien 3 transcatheter heart valve (size 29 mm, model #9600TFX, serial #[id number]) was prepared and crimped per manufacturer's guidelines, and the proper orientation of the valve is confirmed on the [employer] delivery system. The valve was advanced through the introducer sheath using normal technique until in an appropriate position in the abdominal aorta beyond the sheath tip. The balloon was then retracted and using the fine-tuning wheel was centered on the valve. The valve was then advanced across the aortic arch using appropriate flexion of the catheter. The valve was  carefully positioned across the aortic valve annulus. The Commander catheter was retracted using normal technique. Once final position of the  valve has been confirmed by angiographic assessment, the valve is deployed while temporarily holding ventilation and during rapid ventricular pacing to maintain systolic blood pressure < 50 mmHg and pulse pressure < 10 mmHg. The balloon inflation is held for >3 seconds after reaching full deployment volume. Once the balloon has fully deflated the balloon is retracted into the ascending aorta and valve function is assessed using echocardiography. There is felt to be moderate paravalvular leak and no central aortic insufficiency.  The patient's hemodynamic recovery following valve deployment is good.  The valve was carefully assessed. Initially there was concern that the deployment level might be a bit low and the left ventricular outflow tract. However, with careful inspection using TEE and review of the deployment cine it appears the deployment angle and level is reasonably good with a 70% aortic and 30% LV outflow tract level. Careful inspection using TEE confirms that the perivalvular leak is centered around the large bulky calcium extending through the left ventricular annulus down into the inter-valvular fibrosis. 1 mL of saline is added to the atrion and postdilatation of the valve is performed under rapid ventricular pacing. Follow-up assessment reveals minimal significant improvement in the degree of perivalvular leak.  The deployment balloon and guidewire are both removed.   The sheath was removed and femoral artery closure performed.  One of the Perclose sutures breaks and a third Perclose placed to ensure proper hemostasis.  Protamine was administered once femoral arterial repair was complete. The temporary pacemaker, pigtail catheters and femoral sheaths were removed with manual pressure used for hemostasis.    PLACEMENT OF RIGHT PLEUR-X CATHETER:   A small  stab incision was made and an angiocath placed into the right pleural space near the anterior axillary line. A guidewire was advanced through the angiocath and positioned in the posterior pleural space using fluoroscopic guidance.  A Pleur-X catheter was tunneled from a second stab incision located more anteriorly to the posterior stab incision.  Dilating catheters and an introducing sheath were passed over the guidewire.  The guidewire and dilating catheter were removed and the Pleur-X catheter advanced through the introducing sheath into the pleural space.  The sheath was removed and the final position of the catheter verified using fluoroscopy.  The catheter was secured to the skin and connected to a closed suction vacuum bottle for drainage. A total of 1500 mL of serous fluid was evacuated.     PROCEDURE COMPLETION:   Prior to completion of the procedure the patient's mouth was reexamined. There was notably continued bleeding from the patient's upper alveolar ridge on the right side where his teeth had been removed. Surgiflow topical coagulation's solution was applied and several figure-of-eight 3-0 chromic sutures were placed to achieve adequate hemostasis.  The patient tolerated the procedure well and is transported to the surgical intensive care in stable condition. There were no immediate intraoperative complications. All sponge instrument and needle counts are verified correct at completion of the operation.   No blood products were administered during the operation.  The patient received a total of 50.3 mL of intravenous contrast during the procedure.   Rexene Alberts, MD 07/23/2017 10:11 AM

## 2017-07-23 NOTE — CV Procedure (Signed)
HE[facility] AND VASCULAR CENTER  TAVR OPERATIVE NOTE   Date of Procedure:  07/23/2017  Preoperative Diagnosis: Severe Aortic Stenosis   Postoperative Diagnosis: Same   Procedure:    Transcatheter Aortic Valve Replacement - Transfemoral Approach  Edwards Sapien 3 THV (size 29 mm, model # [id number], serial # [id number])   Co-Surgeons:  Valentina Gu. Roxy Manns, MD and Lauree Chandler, MD  Anesthesiologist:  [facility]  Echocardiographer:  Johnsie Cancel  Pre-operative Echo Findings:  Severe aortic stenosis  Moderate left ventricular systolic dysfunction  Post-operative Echo Findings:  Moderate paravalvular leak  Moderate left ventricular systolic dysfunction   BRIEF CLINICAL NOTE AND INDICATIONS FOR SURGERY  76 yo male with COPD, CAD, HTN, severe AS here today for TAVR. He was found to have severe AS in 2016 with normal LV function at that time. He did not follow up with cardiology. Now admitted with CHF and LVEF is 20-25%. Aortic stenosis is severe. Found to have severe mid LAD stenosis on cath last week and now s/p DES placement mid LAD.   During the course of the patient's preoperative work up they have been evaluated comprehensively by a multidisciplinary team of specialists coordinated through the [facility] in the [place] [facility].  They have been demonstrated to suffer from symptomatic severe aortic stenosis as noted above. The patient has been counseled extensively as to the relative risks and benefits of all options for the treatment of severe aortic stenosis including long term medical therapy, conventional surgery for aortic valve replacement, and transcatheter aortic valve replacement.  The patient has been independently evaluated by two cardiac surgeons including Dr Roxy Manns and Dr. Cyndia Bent and they are felt to be at high risk for conventional surgical aortic valve replacement. Both surgeons indicated the patient would be a poor candidate for  conventional surgery. Based upon review of all of the patient's preoperative diagnostic tests they are felt to be candidate for transcatheter aortic valve replacement using the transfemoral approach as an alternative to high risk conventional surgery.    Following the decision to proceed with transcatheter aortic valve replacement, a discussion has been held regarding what types of management strategies would be attempted intraoperatively in the event of life-threatening complications, including whether or not the patient would be considered a candidate for the use of cardiopulmonary bypass and/or conversion to open sternotomy for attempted surgical intervention.  The patient has been advised of a variety of complications that might develop peculiar to this approach including but not limited to risks of death, stroke, paravalvular leak, aortic dissection or other major vascular complications, aortic annulus rupture, device embolization, cardiac rupture or perforation, acute myocardial infarction, arrhythmia, heart block or bradycardia requiring permanent pacemaker placement, congestive heart failure, respiratory failure, renal failure, pneumonia, infection, other late complications related to structural valve deterioration or migration, or other complications that might ultimately cause a temporary or permanent loss of functional independence or other long term morbidity.  The patient provides full informed consent for the procedure as described and all questions were answered preoperatively.    DETAILS OF THE OPERATIVE PROCEDURE  PREPARATION:   The patient is brought to the operating room on the above mentioned date and central monitoring was established by the anesthesia team including placement of a radial arterial line. The patient is placed in the supine position on the operating table.  Intravenous antibiotics are administered. General endotracheal anesthesia is induced uneventfully. A Foley catheter  is placed.  Baseline transesophageal echocardiogram was performed. The patient's  chest, abdomen, both groins, and both lower extremities are prepared and draped in a sterile manner. A time out procedure is performed.   PERIPHERAL ACCESS:   Using the modified Seldinger technique, femoral arterial and venous access were obtained with placement of 6 Fr sheaths on the left side.  A pigtail diagnostic catheter was passed through the femoral arterial sheath under fluoroscopic guidance into the aortic root.  A temporary transvenous pacemaker catheter was passed through the femoral venous sheath under fluoroscopic guidance into the right ventricle.  The pacemaker was tested to ensure stable lead placement and pacemaker capture. Aortic root angiography was performed in order to determine the optimal angiographic angle for valve deployment.  TRANSFEMORAL ACCESS:  Micropuncture kit used for access in the right femoral artery under u/s guidance. Pre-closure with double ProGlide closure devices. The patient was heparinized systemically and ACT verified > 250 seconds.    A 16 Fr transfemoral E-sheath was introduced into the right femoral artery after progressively dilating over an Amplatz superstiff wire. An AL-2 catheter was used to direct a straight-tip exchange length wire across the native aortic valve into the left ventricle. This was exchanged out for a pigtail catheter and position was confirmed in the LV apex. Simultaneous LV and Ao pressures were recorded.  The pigtail catheter was then exchanged for an Amplatz Extra-stiff wire in the LV apex. At that point, BAV was performed using a 26 mm valvuloplasty balloon.  Once optimal position was achieved, BAV was done under rapid ventricular pacing at 180 bpm. The patient recovered well hemodynamically.   TRANSCATHETER HEART VALVE DEPLOYMENT:  An Edwards Sapien 3 THV (size 29 mm) was prepared and crimped per manufacturer's guidelines, and the proper orientation of  the valve is confirmed on the [employer] delivery system. The valve was advanced through the introducer sheath using normal technique until in an appropriate position in the abdominal aorta beyond the sheath tip. The balloon was then retracted and using the fine-tuning wheel was centered on the valve. The valve was then advanced across the aortic arch using appropriate flexion of the catheter. The valve was carefully positioned across the aortic valve annulus. The Commander catheter was retracted using normal technique. Once final position of the valve has been confirmed by angiographic assessment, the valve is deployed while temporarily holding ventilation and during rapid ventricular pacing to maintain systolic blood pressure < 50 mmHg and pulse pressure < 10 mmHg. The balloon inflation is held for >3 seconds after reaching full deployment volume. Once the balloon has fully deflated the balloon is retracted into the ascending aorta and valve function is assessed using TEE. There is felt to be moderate paravalvular leak and no central aortic insufficiency.  Because of the leak in the left cusp at the calcium nodule, we elected to post-dilate the stent valve with extra 1 cc saline in the system. The patient's hemodynamic recovery following valve deployment is good.  The deployment balloon and guidewire are both removed. Echo demostrated acceptable post-procedural gradients, stable mitral valve function, and moderate AI. Aortography confirmed moderate AI.   PROCEDURE COMPLETION:  The sheath was then removed and closure devices completed. A third ProGlide was inserted. Protamine was administered once femoral arterial repair was complete. The temporary pacemaker, pigtail catheters and femoral sheaths were removed with manual pressure used for hemostasis.   The patient tolerated the procedure well and is transported to the surgical intensive care in stable condition. There were no immediate intraoperative  complications. All sponge instrument and  needle counts are verified correct at completion of the operation.   No blood products were administered during the operation.  The patient received a total of 50.3 mL of intravenous contrast during the procedure.  Lauree Chandler MD 07/23/2017 10:01 AM

## 2017-07-23 NOTE — H&P (View-Only) (Signed)
[address]       [place] [address]             [phone]          CARDIOTHORACIC SURGERY CONSULTATION REPORT  PCP is Jaclynn Major, NP Referring Provider is Sherren Mocha, MD   Reason for consultation:  Severe aortic stenosis and CAD  HPI:  Patient is a 76 year old male with history of aortic stenosis, coronary artery disease, hypertension, and COPD referred for a second surgical opinion to discuss treatment options for management of severe symptomatic aortic stenosis. The patient reportedly presented to the emergency department at [facility] in [place] in 2016 with chest pain. Echocardiogram performed at that time revealed severe aortic stenosis with normal left ventricular systolic function. He was referred for cardiology consultation but apparently never followed up.  He presented acutely to [facility] 07/07/2017 with dyspnea on exertion and severe bilateral lower extremity edema. He was notably hypoxemic on presentation chest x-ray revealed congestive heart failure with bilateral pleural effusions, right greater than left. Troponin levels were minimally elevated and remained stable.  Transthoracic echocardiogram was performed demonstrating severe left ventricular systolic dysfunction with ejection fraction estimated 20-25%. There was severe aortic stenosis with mean gradient estimated 51 mmHg and aortic valve area calculated 0.41 cm. There was mild mitral regurgitation and moderate to severe tricuspid regurgitation. The patient was treated with diuretics and transferred to [facility] for further management. He was seen in consultation by Dr. Burt Knack and repeat echocardiogram confirmed the presence of severe aortic stenosis with peak velocity across the aortic valve measured 4.1 m/s corresponding to mean transvalvular gradient estimated 36 mmHg. The DVI was 0.16 and aortic valve area calculated 0.41 cm.  There was severe left  ventricular systolic dysfunction with ejection fraction estimated 25-30%. There was mild to moderate aortic insufficiency, mild mitral regurgitation, mild tricuspid regurgitation, and severe pulmonary hypertension.  Left and right heart catheterization was performed 07/10/2017. This revealed severe single-vessel coronary artery disease with long segment 90% stenosis of the mid left anterior descending coronary artery and there was otherwise mild nonobstructive coronary artery disease. There was severe pulmonary hypertension.  The following day the patient underwent right thoracentesis yielding 2 L of serous fluid.  The patient has been seen in consultation by Dr. Cyndia Bent and felt to be high risk for conventional surgical aortic valve replacement and coronary artery bypass grafting. The patient was seen in consultation by Dr. Enrique Sack and underwent dental extraction on 07/15/2017. CT angiography has been performed and a second surgical opinion has been requested.  The patient is single and lives alone in [place]. He has a friend name [name] who lives close by and checks on him regularly here at reportedly the patient still drives an automobile although his mobility is limited. He walks using a cane. He has a remote history of tobacco use. He lives a sedentary lifestyle. He gets short of breath with activity but reports that he is currently feeling much better than he was at the time of his presentation. He has had chronic dyspnea with activity and severe lower extremity edema. He has had orthopnea. He denies any history of chest pain or chest tightness although he had some epigastric discomfort at the time of presentation. He has never had any dizzy spells or syncope.   Past Medical History:  Diagnosis Date  . Aortic stenosis, severe   . CAD (coronary artery disease)   . Chronic systolic  CHF (congestive heart failure) ([place])   . COPD (chronic obstructive pulmonary disease) ([place])   .  Hypertension     Past Surgical History:  Procedure Laterality Date  . IR THORACENTESIS ASP PLEURAL SPACE W/IMG GUIDE  07/11/2017  . MULTIPLE EXTRACTIONS WITH ALVEOLOPLASTY N/A 07/15/2017   Procedure: Extraction of tooth #'s 4,6,7,8,9,15, 20,21,22, 23 ,24,26,and 27 with alveoloplassty;  Surgeon: Lenn Cal, DDS;  Location: [place];  Service: Oral Surgery;  Laterality: N/A;    Family History  Problem Relation Age of Onset  . Congestive Heart Failure Paternal Uncle     Social History   Social History  . Marital status: Single    Spouse name: N/A  . Number of children: N/A  . Years of education: N/A   Occupational History  . Not on file.   Social History Main Topics  . Smoking status: Former Research scientist (life sciences)  . Smokeless tobacco: Never Used  . Alcohol use Not on file  . Drug use: Unknown  . Sexual activity: Not on file   Other Topics Concern  . Not on file   Social History Narrative  . No narrative on file    Prior to Admission medications   Medication Sig Start Date End Date Taking? Authorizing Provider  aspirin EC 81 MG tablet Take 81 mg by mouth daily.   Yes [provider]  furosemide (LASIX) 40 MG tablet Take 40 mg by mouth daily. 07/04/17  Yes [provider]  lisinopril (PRINIVIL,ZESTRIL) 10 MG tablet Take 10 mg by mouth daily. 06/06/17  Yes [provider]  metoprolol tartrate (LOPRESSOR) 25 MG tablet Take 12.5 mg by mouth 2 (two) times daily.  06/19/17  Yes [provider]    Current Facility-Administered Medications  Medication Dose Route Frequency Provider Last Rate Last Dose  . 0.9 %  sodium chloride infusion  250 mL Intravenous PRN Sherren Mocha, MD      . acetaminophen (TYLENOL) tablet 650 mg  650 mg Oral Q6H PRN Danford, Suann Larry, MD       Or  . acetaminophen (TYLENOL) suppository 650 mg  650 mg Rectal Q6H PRN Danford, Suann Larry, MD      . aminocaproic acid (AMICAR) oral solution 50 mg/mL (5%), 100 ml  10 mL Oral Q1H  PRN Teena Dunk F, DDS      . aminocaproic acid (AMICAR) oral solution 50 mg/mL (5%), 100 ml  5 mL Oral Q4H while awake Angelena Form R, PA-C   5 mL at 07/16/17 1800  . aspirin EC tablet 81 mg  81 mg Oral Daily Edwin Dada, MD   81 mg at 07/16/17 0951  . atorvastatin (LIPITOR) tablet 80 mg  80 mg Oral q1800 Eileen Stanford, PA-C   80 mg at 07/16/17 1709  . bacitracin ointment 1 application  1 application Topical V3X Lenn Cal, DDS   1 application at 10/62/69 1440  . [START ON 07/17/2017] clopidogrel (PLAVIX) tablet 75 mg  75 mg Oral Daily Sherren Mocha, MD      . feeding supplement (ENSURE ENLIVE) (ENSURE ENLIVE) liquid 237 mL  237 mL Oral BID BM Sherren Mocha, MD      . Derrill Memo ON 07/18/2017] furosemide (LASIX) tablet 40 mg  40 mg Oral Daily Eileen Stanford, PA-C      . heparin injection 5,000 Units  5,000 Units Subcutaneous Q8H Eileen Stanford, PA-C   5,000 Units at 07/16/17 1440  . lactated ringers infusion   Intravenous Continuous Kulinski,  Pete Glatter, DDS 10 mL/hr at 07/15/17 1451    . lidocaine (PF) (XYLOCAINE) 1 % injection    PRN Ardis Rowan, PA-C   10 mL at 07/11/17 1402  . ondansetron (ZOFRAN) tablet 4 mg  4 mg Oral Q6H PRN Danford, Suann Larry, MD       Or  . ondansetron (ZOFRAN) injection 4 mg  4 mg Intravenous Q6H PRN Danford, Suann Larry, MD      . oxyCODONE-acetaminophen (PERCOCET/ROXICET) 5-325 MG per tablet 1-2 tablet  1-2 tablet Oral Q6H PRN Lenn Cal, DDS      . [START ON 07/17/2017] potassium chloride SA (K-DUR,KLOR-CON) CR tablet 20 mEq  20 mEq Oral Daily Angelena Form R, PA-C      . sodium chloride flush (NS) 0.9 % injection 3 mL  3 mL Intravenous Q12H Sherren Mocha, MD   3 mL at 07/16/17 1000  . sodium chloride flush (NS) 0.9 % injection 3 mL  3 mL Intravenous PRN Sherren Mocha, MD        No Known Allergies    Review of Systems:   General:  poor appetite, decreased energy, no weight gain, no weight  loss, no fever  Cardiac:  no chest pain with exertion, no chest pain at rest, + SOB with exertion, + resting SOB, no PND, + orthopnea, no palpitations, no arrhythmia, no atrial fibrillation, + severe LE edema, no dizzy spells, no syncope  Respiratory:  + shortness of breath, no home oxygen, no productive cough, no dry cough, no bronchitis, no wheezing, no hemoptysis, no asthma, no pain with inspiration or cough, no sleep apnea, no CPAP at night  GI:   no difficulty swallowing, no reflux, no frequent heartburn, no hiatal hernia, no abdominal pain, no constipation, no diarrhea, no hematochezia, no hematemesis, no melena  GU:   no dysuria,  no frequency, no urinary tract infection, no hematuria, no enlarged prostate, no kidney stones, no kidney disease  Vascular:  no pain suggestive of claudication, no pain in feet, no leg cramps, no varicose veins, no DVT, no non-healing foot ulcer  Neuro:   no stroke, no TIA's, no seizures, no headaches, no temporary blindness one eye,  no slurred speech, no peripheral neuropathy, no chronic pain, + instability of gait, + significant memory/cognitive dysfunction  Musculoskeletal: + arthritis , no joint swelling, no myalgias, + difficulty walking, limited mobility   Skin:   no rash, no itching, no skin infections, no pressure sores or ulcerations  Psych:   no anxiety, no depression, no nervousness, no unusual recent stress  Eyes:   no blurry vision, no floaters, no recent vision changes, does not wears glasses or contacts  ENT:   no hearing loss  Hematologic:  no easy bruising, no abnormal bleeding, no clotting disorder, no frequent epistaxis  Endocrine:  no diabetes, does not check CBG's at home     Physical Exam:   BP (!) 90/50 (BP Location: Right Arm)   Pulse 76   Temp 98 F (36.7 C) (Oral)   Resp 18   Ht 5\' 6"  (1.676 m)   Wt 141 lb 12.8 oz (64.3 kg)   SpO2 96%   BMI 22.89 kg/m   General:  Thin and frail-appearing, limited understanding re his health  problems  HEENT:  Unremarkable   Neck:   no JVD, no bruits, no adenopathy   Chest:   clear to auscultation, symmetrical breath sounds, no wheezes, no rhonchi   CV:   RRR, grade III/VI  late peaking systolic murmur   Abdomen:  soft, non-tender, no masses   Extremities:  warm, well-perfused, pulses diminished, + lower extremity edema  Rectal/GU  Deferred  Neuro:   Grossly non-focal and symmetrical throughout  Skin:   Clean and dry, no rashes, no breakdown  Diagnostic Tests:  Transthoracic Echocardiography  Patient:    Edward Strickland, Edward Strickland MR #:       [id number] Study Date: 07/09/2017 Gender:     M Age:        10 Height:     167.6 cm Weight:     68.4 kg BSA:        1.79 m^2 Pt. Status: Room:       3E12C   ADMITTING    Waldemar Dickens  ATTENDING    Waldemar Dickens  PERFORMING   [facility], Inpatient  SONOGRAPHER  Alvino Chapel, RCS  ORDERING     [place] Silos, Katie  Sharl Ma, Katie  cc:  ------------------------------------------------------------------- LV EF: 25% -   30%  ------------------------------------------------------------------- Indications:      Aortic stenosis 424.1.  ------------------------------------------------------------------- History:   PMH:  Acquired from the patient and from the patient&'s chart.  Congestive heart failure.  Chronic obstructive pulmonary disease.  Risk factors:  Hypertension.  ------------------------------------------------------------------- Study Conclusions  - Left ventricle: The cavity size was mildly dilated. Wall   thickness was increased in a pattern of mild LVH. Systolic   function was severely reduced. The estimated ejection fraction   was in the range of 25% to 30%. Diffuse hypokinesis. - Aortic valve: Valve mobility was restricted. There was severe   stenosis. There was mild to moderate regurgitation. Valve area   (VTI): 0.41 cm^2. Valve area (Vmax): 0.35 cm^2. Valve area   (Vmean): 0.39 cm^2. - Left atrium:  The atrium was moderately dilated. - Right ventricle: The cavity size was moderately dilated. Systolic   function was moderately reduced. - Right atrium: The atrium was moderately dilated. - Pulmonary arteries: Systolic pressure was severely increased. PA   peak pressure: 79 mm Hg (S). - Pericardium, extracardiac: There was a left pleural effusion.  Impressions:  - Severe global reduction in LV systolic function; mild LVE and   LVH; calcified aortic valve with severe AS and mild to moderate   AI; moderate LAE; moderate RVE with moderately reduced function;   mild TR with severely elevated pulmonary pressure.  ------------------------------------------------------------------- Study data:  No prior study was available for comparison.  Study status:  Routine.  Procedure:  The patient reported no pain pre or post test. Transthoracic echocardiography. Image quality was adequate.  Study completion:  There were no complications. Transthoracic echocardiography.  M-mode, complete 2D, spectral Doppler, and color Doppler.  Birthdate:  Patient birthdate: December 11, 1940.  Age:  Patient is 76 yr old.  Sex:  Gender: male. BMI: 24.4 kg/m^2.  Blood pressure:     93/52  Patient status: Inpatient.  Study date:  Study date: 07/09/2017. Study time: 03:37 PM.  Location:  Bedside.  -------------------------------------------------------------------  ------------------------------------------------------------------- Left ventricle:  The cavity size was mildly dilated. Wall thickness was increased in a pattern of mild LVH. Systolic function was severely reduced. The estimated ejection fraction was in the range of 25% to 30%. Diffuse hypokinesis.  ------------------------------------------------------------------- Aortic valve:   Severely calcified leaflets. Valve mobility was restricted.  Doppler:   There was severe stenosis.   There was mild to moderate regurgitation.    VTI ratio of LVOT to aortic  valve: 0.18. Valve area (VTI):  0.41 cm^2. Indexed valve area (VTI): 0.23 cm^2/m^2. Peak velocity ratio of LVOT to aortic valve: 0.16. Valve area (Vmax): 0.35 cm^2. Indexed valve area (Vmax): 0.2 cm^2/m^2. Mean velocity ratio of LVOT to aortic valve: 0.17. Valve area (Vmean): 0.39 cm^2. Indexed valve area (Vmean): 0.22 cm^2/m^2. Mean gradient (S): 36 mm Hg. Peak gradient (S): 66 mm Hg.  ------------------------------------------------------------------- Aorta:  Aortic root: The aortic root was normal in size.  ------------------------------------------------------------------- Mitral valve:   Structurally normal valve.   Mobility was not restricted.  Doppler:  Transvalvular velocity was within the normal range. There was no evidence for stenosis. There was trivial regurgitation.    Indexed valve area by pressure half-time: 1.23 cm^2/m^2.    Peak gradient (D): 5 mm Hg.  ------------------------------------------------------------------- Left atrium:  The atrium was moderately dilated.  ------------------------------------------------------------------- Right ventricle:  The cavity size was moderately dilated. Systolic function was moderately reduced.  ------------------------------------------------------------------- Pulmonic valve:    Doppler:  Transvalvular velocity was within the normal range. There was no evidence for stenosis. There was trivial regurgitation.  ------------------------------------------------------------------- Tricuspid valve:   Structurally normal valve.    Doppler: Transvalvular velocity was within the normal range. There was mild regurgitation.  ------------------------------------------------------------------- Pulmonary artery:   Systolic pressure was severely increased.  ------------------------------------------------------------------- Right atrium:  The atrium was moderately  dilated.  ------------------------------------------------------------------- Pericardium:  There was no pericardial effusion.  ------------------------------------------------------------------- Systemic veins: Inferior vena cava: The vessel was normal in size.  ------------------------------------------------------------------- Pleura:  There was a left pleural effusion.  ------------------------------------------------------------------- Measurements   Left ventricle                           Value          Reference  LV ID, ED, PLAX chordal          (H)     53    mm       43 - 52  LV ID, ES, PLAX chordal          (H)     47    mm       23 - 38  LV fx shortening, PLAX chordal   (L)     11    %        >=29  LV PW thickness, ED                      12    mm       ----------  IVS/LV PW ratio, ED                      0.92           <=1.3  Stroke volume, 2D                        35    ml       ----------  Stroke volume/bsa, 2D                    20    ml/m^2   ----------  LV ejection fraction, 1-p A4C            42    %        ----------  LV end-diastolic volume, 2-p             184   ml       ----------  LV end-systolic volume, 2-p              121   ml       ----------  LV ejection fraction, 2-p                34    %        ----------  Stroke volume, 2-p                       63    ml       ----------  LV end-diastolic volume/bsa, 2-p         103   ml/m^2   ----------  LV end-systolic volume/bsa, 2-p          67    ml/m^2   ----------  Stroke volume/bsa, 2-p                   35.1  ml/m^2   ----------    Ventricular septum                       Value          Reference  IVS thickness, ED                        11    mm       ----------    LVOT                                     Value          Reference  LVOT ID, S                               17    mm       ----------  LVOT area                                2.27  cm^2     ----------  LVOT peak velocity, S                     63.4  cm/s     ----------  LVOT mean velocity, S                    47    cm/s     ----------  LVOT VTI, S                              15.5  cm       ----------  LVOT peak gradient, S                    2     mm Hg    ----------    Aortic valve                             Value          Reference  Aortic valve peak velocity, S            406   cm/s     ----------  Aortic valve mean velocity, S            274   cm/s     ----------  Aortic valve VTI, S                      86.2  cm       ----------  Aortic mean gradient, S                  36    mm Hg    ----------  Aortic peak gradient, S                  66    mm Hg    ----------  VTI ratio, LVOT/AV                       0.18           ----------  Aortic valve area, VTI                   0.41  cm^2     ----------  Aortic valve area/bsa, VTI               0.23  cm^2/m^2 ----------  Velocity ratio, peak, LVOT/AV            0.16           ----------  Aortic valve area, peak velocity         0.35  cm^2     ----------  Aortic valve area/bsa, peak              0.2   cm^2/m^2 ----------  velocity  Velocity ratio, mean, LVOT/AV            0.17           ----------  Aortic valve area, mean velocity         0.39  cm^2     ----------  Aortic valve area/bsa, mean              0.22  cm^2/m^2 ----------  velocity  Aortic regurg pressure half-time         171   ms       ----------    Aorta                                    Value          Reference  Aortic root ID, ED                       30    mm       ----------    Left atrium                              Value          Reference  LA ID, A-P, ES                           42    mm       ----------  LA ID/bsa, A-P                   (H)     2.34  cm/m^2   <=2.2  LA volume, S                             82.6  ml       ----------  LA volume/bsa, S                         46.1  ml/m^2   ----------  LA volume, ES, 1-p A4C                   77.9  ml       ----------  LA volume/bsa, ES, 1-p A4C                43.4  ml/m^2   ----------  LA volume, ES, 1-p A2C                   80.8  ml       ----------  LA volume/bsa, ES, 1-p A2C               45    ml/m^2   ----------    Mitral valve                             Value          Reference  Mitral E-wave peak velocity              111   cm/s     ----------  Mitral A-wave peak velocity              42.2  cm/s     ----------  Mitral deceleration time         (L)     106   ms       150 - 230  Mitral pressure half-time                31    ms       ----------  Mitral peak gradient, D                  5     mm Hg    ----------  Mitral E/A ratio, peak                   2.6            ----------  Mitral valve area/bsa, PHT, DP           1.23  cm^2/m^2 ----------    Pulmonary arteries                       Value          Reference  PA pressure, S, DP               (H)     79    mm Hg    <=30    Tricuspid valve                          Value          Reference  Tricuspid regurg peak velocity           400   cm/s     ----------  Tricuspid peak RV-RA gradient            64  mm Hg    ----------    Right atrium                             Value          Reference  RA ID, S-I, ES, A4C              (H)     54.7  mm       34 - 49  RA area, ES, A4C                 (H)     22    cm^2     8.3 - 19.5  RA volume, ES, A/L                       76.2  ml       ----------  RA volume/bsa, ES, A/L                   42.5  ml/m^2   ----------    Systemic veins                           Value          Reference  Estimated CVP                            15    mm Hg    ----------    Right ventricle                          Value          Reference  RV ID, ED, PLAX                          37    mm       19 - 38  TAPSE                                    13.5  mm       ----------  RV pressure, S, DP               (H)     79    mm Hg    <=30  RV s&', lateral, S                        9.73  cm/s     ----------  Legend: (L)  and  (H)  mark values outside  specified reference range.  ------------------------------------------------------------------- Prepared and Electronically Authenticated by  Kirk Ruths 2018-09-11T16:49:04   RIGHT HEART CATH AND CORONARY ANGIOGRAPHY  Conclusion   1. Severe single-vessel coronary artery disease with severe stenosis in the mid LAD and otherwise mild diffuse nonobstructive calcific coronary disease 2. Known severe aortic stenosis with very heavy calcification of the aortic valve on plain fluoroscopy and severely restricted aortic leaflet mobility 3. Severe pulmonary hypertension likely secondary to left heart disease with severely elevated pulmonary capillary wedge pressure  Recommendation: Continued evaluation by the multidisciplinary heart valve team. Likely proceed with PCI of the LAD followed by TAVR pending cardiac surgical consultation  Indications  Severe aortic stenosis [I35.0 (ICD-10-CM)]  Procedural Details/Technique   Technical Details INDICATION: Severe aortic stenosis/Acute systolic heart failure  PROCEDURAL DETAILS: There was an indwelling IV in a right antecubital vein. Using normal sterile technique, the IV was changed out for a 5 Fr brachial sheath over a 0.018 inch wire. The right wrist was then prepped, draped, and anesthetized with 1% lidocaine. Using the modified Seldinger technique a 5/6 French Slender sheath was placed in the right radial artery. Intra-arterial verapamil was administered through the radial artery sheath. IV heparin was administered after a JR4 catheter was advanced into the central aorta. A Swan-Ganz catheter was used for the right heart catheterization. Standard protocol was followed for recording of right heart pressures and sampling of oxygen saturations. Fick cardiac output was calculated. Standard Judkins catheters were used for selective coronary angiography. There were no immediate procedural complications. The patient was transferred to the post  catheterization recovery area for further monitoring.  Note the patient's respiratory status was marginal after receiving low doses of Versed and fentanyl. He required a nonrebreather through much of the procedure. At the completion of the procedure he had an oxygen saturation greater than 90% on oxygen per nasal cannula and he was awake and alert.     Estimated blood loss <50 mL.  During this procedure the patient was administered the following to achieve and maintain moderate conscious sedation: Versed 1 mg, Fentanyl 25 mcg, while the patient's heart rate, blood pressure, and oxygen saturation were continuously monitored. The period of conscious sedation was 31 minutes, of which I was present face-to-face 100% of this time.    Coronary Findings   Dominance: Right  Left Anterior Descending  Prox LAD to Mid LAD lesion, 90% stenosed. The lesion is calcified. There is a long lesion in the mid LAD with the most severe portion of the lesion approaching 90% stenosis  First Diagonal Branch  Vessel is large in size. The first diagonal is a large branch with no stenosis  Left Circumflex  First Obtuse Marginal Branch  Ost 1st Mrg to 1st Mrg lesion, 40% stenosed.  Right Coronary Artery  There is mild the vessel.  Mid RCA lesion, 50% stenosed. The lesion is calcified.  Right Heart   Right Heart Pressures Hemodynamic findings consistent with severe pulmonary hypertension.    Left Heart   Aortic Valve There is severe aortic valve stenosis. The aortic valve is calcified. There is restricted aortic valve motion.    Coronary Diagrams   Diagnostic Diagram       Implants     No implant documentation for this case.  PACS Images   Show images for CARDIAC CATHETERIZATION   Link to Procedure Log   Procedure Log    Hemo Data    Most Recent Value  Fick Cardiac Output 4.42 L/min  Fick Cardiac Output Index 2.48 (L/min)/BSA  RA A Wave 19 mmHg  RA V Wave 13 mmHg  RA Mean 11 mmHg  RV  Systolic Pressure 82 mmHg  RV Diastolic Pressure 12 mmHg  RV EDP 22 mmHg  PA Systolic Pressure 79 mmHg  PA Diastolic Pressure 39 mmHg  PA Mean 52 mmHg  PW A Wave 40 mmHg  PW V Wave 42 mmHg  PW Mean 36 mmHg  AO Systolic Pressure 536 mmHg  AO Diastolic Pressure 48 mmHg  AO Mean 72 mmHg  QP/QS 0.67  TPVR Index 31.07 HRUI  TSVR Index 28.99 HRUI  PVR SVR Ratio 0.39  TPVR/TSVR Ratio 1.07  Cardiac TAVR CT  TECHNIQUE: The patient was scanned on a [employer]. A 120 kV retrospective scan was triggered in the descending thoracic aorta at 111 HU's. Gantry rotation speed was 250 msecs and collimation was .6 mm. No beta blockade or nitro were given. The 3D data set was reconstructed in 5% intervals of the R-R cycle. Systolic and diastolic phases were analyzed on a dedicated work station using MPR, MIP and VRT modes. The patient received 80 cc of contrast.  FINDINGS: Aortic Valve: Trileaflet aortic valve with heavily calcified leaflets and severe leaflet opening restriction. There are significant asymmetric calcifications extending into the LVOT under the non-coronary leaflet.  Aorta:  Normal size, mild diffuse calcifications, no dissection.  Sinotubular Junction:  31 x 28 mm  Ascending Thoracic Aorta:  36 x 35 mm  Aortic Arch:  24 x 24 mm  Descending Thoracic Aorta:  24 x 24 mm  Sinus of Valsalva Measurements:  Non-coronary:  35 mm  Right -coronary:  33 mm  Left -coronary:  34 mm  Coronary Artery Height above Annulus:  Left Main:  17 mm  Right Coronary:  18 mm  Virtual Basal Annulus Measurements:  Maximum/Minimum Diameter:  31.6 x 23.4 mm  Perimeter:  89.2 mm  Area:  601 mm2  Coronary Arteries:  Normal origin.  Optimum Fluoroscopic Angle for Delivery:  LAU 7 CAU 7  IMPRESSION: 1. Trileaflet aortic valve with heavily calcified leaflets and severe leaflet opening restriction. There are significant asymmetric calcifications  extending into the LVOT under the non-coronary leaflet. Annular measurements suitable for delivery of a 29 mm Edwards-SAPIEN valve.  2. Sufficient annulus to coronary distance.  3. Optimum Fluoroscopic Angle for Delivery:  LAU 7 CAU 7.  4. No Thrombus in the left atrial appendage.  5. Dilated pulmonary artery measuring 33 x 32 mm suggestive of pulmonary hypertension.  Ena Dawley  Electronically Signed: By: Ena Dawley On: 07/12/2017 18:17   CT ANGIOGRAPHY CHEST, ABDOMEN AND PELVIS  TECHNIQUE: Multidetector CT imaging through the chest, abdomen and pelvis was performed using the standard protocol during bolus administration of intravenous contrast. Multiplanar reconstructed images and MIPs were obtained and reviewed to evaluate the vascular anatomy.  CONTRAST:  100 mL of Isovue 370.  COMPARISON:  None.  FINDINGS: CTA CHEST FINDINGS  Cardiovascular: Cardiomegaly. There is no significant pericardial fluid, thickening or pericardial calcification. There is aortic atherosclerosis, as well as atherosclerosis of the great vessels of the mediastinum and the coronary arteries, including calcified atherosclerotic plaque in the left anterior descending coronary artery. Severe thickening calcification of the aortic valve.  Mediastinum/Lymph Nodes: Multiple prominent borderline enlarged mediastinal and bilateral hilar lymph nodes are noted measuring up to 9 mm in short axis (nonspecific). Esophagus is unremarkable in appearance. No axillary lymphadenopathy.  Lungs/Pleura: Patchy areas of ground-glass attenuation and septal thickening are noted throughout the lungs bilaterally, presumably related to a background of moderate pulmonary edema. Moderate right and small to moderate left pleural effusions lie dependently with associated areas of passive subsegmental atelectasis in the dependent lung bases. No definite suspicious appearing pulmonary nodules or  masses are noted.  Musculoskeletal/Soft Tissues: There are no aggressive appearing lytic or blastic lesions noted in the visualized portions of the skeleton.  CTA ABDOMEN AND PELVIS FINDINGS  Hepatobiliary: No cystic or solid hepatic lesions. No intra or extrahepatic biliary ductal dilatation. Gallbladder is unremarkable in appearance.  Pancreas: No pancreatic mass. No pancreatic ductal dilatation. No pancreatic or peripancreatic fluid or inflammatory changes.  Spleen:  Unremarkable.  Adrenals/Urinary Tract: Subcentimeter low-attenuation lesion in the right kidney is too small to definitively characterize, but is statistically likely a cyst. Left kidney and bilateral adrenal glands are normal in appearance. No hydroureteronephrosis. Urinary bladder is normal in appearance.  Stomach/Bowel: Normal appearance of the stomach. No pathologic dilatation of small bowel or colon. A few scattered colonic diverticulae are noted, without surrounding inflammatory changes to suggest an acute diverticulitis at this time. Normal appendix.  Vascular/Lymphatic: Aortic atherosclerosis with vascular findings and measurements pertinent to potential TAVR procedure, as detailed below. No aneurysm or dissection noted in the abdominal or pelvic vasculature. Celiac axis, superior mesenteric artery and inferior mesenteric artery are all widely patent without hemodynamically significant stenosis. Celiac axis is ectatic measuring up to 9 mm in diameter. Two right and 2 left renal arteries are all widely patent without hemodynamically significant stenosis. No lymphadenopathy noted in the abdomen or pelvis.  Reproductive: Prostate gland and seminal vesicles are unremarkable in appearance.  Other: No significant volume of ascites.  No pneumoperitoneum.  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 There are no aggressive appearing lytic or blastic lesions noted in the visualized portions of the skeleton.  VASCULAR MEASUREMENTS PERTINENT TO TAVR:  AORTA:  Minimal Aortic Diameter -  11 x 9 mm  Severity of Aortic Calcification -  moderate to severe  RIGHT PELVIS:  Right Common Iliac Artery -  Minimal Diameter - 7.9 x 6.7 mm  Tortuosity - mild  Calcification - mild  Right External Iliac Artery -  Minimal Diameter - 7.7 x 8.9 mm  Tortuosity - mild  Calcification - none  Right Common Femoral Artery -  Minimal Diameter - 7.6 x 6.3 mm  Tortuosity - mild  Calcification - mild to moderate  LEFT PELVIS:  Left Common Iliac Artery -  Minimal Diameter - 6.5 x 5.9 mm  Tortuosity - mild  Calcification - mild to moderate  Left External Iliac Artery -  Minimal Diameter - 7.0 x 5.9 mm  Tortuosity - mild  Calcification - none  Left Common Femoral Artery -  Minimal Diameter - 7.7 x 5.7 mm  Tortuosity - mild  Calcification - mild to moderate  Review of the MIP images confirms the above findings.  IMPRESSION: 1. Vascular findings and measurements pertinent to potential TAVR procedure, as detailed above. This patient does have suitable pelvic arterial access bilaterally. 2. Severe thickening calcification of the aortic valve, compatible with the reported clinical history of severe aortic stenosis. 3. Findings in the thorax suggestive of underlying congestive heart failure, including moderate pulmonary edema, moderate right and small a moderate left pleural effusions, and cardiomegaly. 4. Aortic atherosclerosis, in addition to left anterior descending coronary artery disease. Assessment for potential risk factor modification, dietary therapy or pharmacologic therapy may be warranted, if clinically indicated. 5. Colonic diverticulosis without evidence to suggest an acute diverticulitis at this time. 6.  Additional incidental findings, as above. Aortic Atherosclerosis (ICD10-I70.0).   Electronically Signed   By: Vinnie Langton M.D.   On: 07/16/2017 06:55   Impression:  Patient has stage D severe symptomatic aortic stenosis and single-vessel coronary artery disease. He presents with acute exacerbation of chronic combined systolic and diastolic congestive heart failure. At the time of his presentation he had class IV symptoms with resting shortness of breath and hypoxemia. He is currently doing much better following diuretic therapy and drainage of his right pleural effusion. I  personally reviewed the patient's transthoracic echocardiogram, diagnostic cardiac catheterization, and CT angiograms. Echocardiogram reveals severe calcification and thickening of all 3 leaflets of the patient's aortic valve with extremely severely limited mobility. There is severe left ventricular systolic dysfunction and despite this peak velocity across the aortic valve still measured greater than 4 m/s, consistent with critical aortic stenosis. Diagnostic cardiac catheterization reveals long segment 90% stenosis of left anterior descending coronary artery with otherwise mild nonobstructive coronary artery disease. There was severe pulmonary hypertension. I agree the patient needs aortic valve replacement and coronary revascularization. Risks associated with conventional surgery would unquestionably be very high, and I would be very reluctant to consider this patient a candidate for conventional surgery given his age, severe physical deconditioning and decompensated heart failure.  Cardiac-gated CTA of the heart reveals anatomical characteristics consistent with aortic stenosis suitable for treatment by transcatheter aortic valve replacement without any significant complicating features, although the patient may be at somewhat increased risk for paravalvular leak due to the presence of significant calcification in the annulus  beneath the noncoronary leaflet which extends into the aorto-mitral curtain.  CTA of the aorta and iliac vessels demonstrate what appears to be adequate pelvic vascular access to facilitate a transfemoral approach.   Plan:  The patient and his friend [name] were counseled at length regarding treatment alternatives for management of severe symptomatic aortic stenosis. Alternative approaches such as conventional aortic valve replacement, transcatheter aortic valve replacement, and palliative medical therapy were compared and contrasted at length.  The risks associated with conventional surgical aortic valve replacement were been discussed in detail, as were expectations for post-operative convalescence, and why I would be reluctant to consider this patient a candidate for conventional surgery.  Issues specific to transcatheter aortic valve replacement were discussed including questions about long term valve durability, the potential for paravalvular leak, possible increased risk of need for permanent pacemaker placement, and other technical complications related to the procedure itself.  Long-term prognosis with medical therapy was discussed. This discussion was placed in the context of the patient's own specific clinical presentation and past medical history.  All of their questions been addressed.  I agree with plans to proceed with PCI of the left anterior descending coronary artery followed by transcatheter aortic valve replacement early next week. All questions answered.   I spent in excess of 90 minutes during the conduct of this hospital consultation and >50% of this time involved direct face-to-face encounter for counseling and/or coordination of the patient's care.    Valentina Gu. Roxy Manns, MD 07/16/2017 6:29 PM

## 2017-07-23 NOTE — Procedures (Signed)
Extubation Procedure Note  Patient Details:   Name: Edward Strickland DOB: 1940-11-17 MRN: [id number]   Airway Documentation:     Evaluation  O2 sats: stable throughout Complications: No apparent complications Patient did tolerate procedure well. Bilateral Breath Sounds: Rhonchi   Yes   Patient extubated to [place]. Vital signs stable. No complications. Patient is tolerating well at this time. RN at bedside. RT will continue to monitor.  Mcneil Sober 07/23/2017, 4:16 PM

## 2017-07-24 ENCOUNTER — Encounter (HOSPITAL_COMMUNITY): Payer: Self-pay | Admitting: Cardiovascular Disease

## 2017-07-24 ENCOUNTER — Inpatient Hospital Stay (HOSPITAL_COMMUNITY): Payer: Medicare Other

## 2017-07-24 DIAGNOSIS — I371 Nonrheumatic pulmonary valve insufficiency: Secondary | ICD-10-CM

## 2017-07-24 DIAGNOSIS — Z954 Presence of other heart-valve replacement: Secondary | ICD-10-CM

## 2017-07-24 DIAGNOSIS — I350 Nonrheumatic aortic (valve) stenosis: Secondary | ICD-10-CM

## 2017-07-24 DIAGNOSIS — I361 Nonrheumatic tricuspid (valve) insufficiency: Secondary | ICD-10-CM

## 2017-07-24 LAB — BASIC METABOLIC PANEL
ANION GAP: 7 (ref 5–15)
BUN: 15 mg/dL (ref 6–20)
CALCIUM: 8.3 mg/dL — AB (ref 8.9–10.3)
CHLORIDE: 103 mmol/L (ref 101–111)
CO2: 25 mmol/L (ref 22–32)
Creatinine, Ser: 0.89 mg/dL (ref 0.61–1.24)
GFR calc non Af Amer: >60 mL/min (ref >60)
GLUCOSE: 109 mg/dL — AB (ref 65–99)
Potassium: 4 mmol/L (ref 3.5–5.1)
Sodium: 135 mmol/L (ref 135–145)

## 2017-07-24 LAB — CBC
HEMATOCRIT: 30.3 % — AB (ref 39.0–52.0)
HEMOGLOBIN: 10 g/dL — AB (ref 13.0–17.0)
MCH: 29.1 pg (ref 26.0–34.0)
MCHC: 33 g/dL (ref 30.0–36.0)
MCV: 88.1 fL (ref 78.0–100.0)
Platelets: 218 10*3/uL (ref 150–400)
RBC: 3.44 MIL/uL — AB (ref 4.22–5.81)
RDW: 14.8 % (ref 11.5–15.5)
WBC: 11.2 10*3/uL — ABNORMAL HIGH (ref 4.0–10.5)

## 2017-07-24 LAB — ECHOCARDIOGRAM COMPLETE
Height: 66 in
Weight: 2239.87 oz

## 2017-07-24 LAB — MAGNESIUM: MAGNESIUM: 1.7 mg/dL (ref 1.7–2.4)

## 2017-07-24 LAB — GLUCOSE, CAPILLARY
GLUCOSE-CAPILLARY: 109 mg/dL — AB (ref 65–99)
GLUCOSE-CAPILLARY: 111 mg/dL — AB (ref 65–99)

## 2017-07-24 MED ORDER — CHLORHEXIDINE GLUCONATE CLOTH 2 % EX PADS
6.0000 | MEDICATED_PAD | Freq: Every day | CUTANEOUS | Status: DC
  Administered 2017-07-24 – 2017-07-25 (×2): 6 via TOPICAL

## 2017-07-24 MED ORDER — FUROSEMIDE 40 MG PO TABS
40.0000 mg | ORAL_TABLET | Freq: Every day | ORAL | Status: DC
  Administered 2017-07-25 – 2017-07-26 (×2): 40 mg via ORAL
  Filled 2017-07-24 (×2): qty 1

## 2017-07-24 MED ORDER — MAGNESIUM SULFATE 2 GM/50ML IV SOLN
2.0000 g | Freq: Once | INTRAVENOUS | Status: AC
  Administered 2017-07-24: 2 g via INTRAVENOUS
  Filled 2017-07-24: qty 50

## 2017-07-24 MED FILL — Thrombin For Soln 5000 Unit: CUTANEOUS | Qty: 5000 | Status: AC

## 2017-07-24 NOTE — Anesthesia Postprocedure Evaluation (Signed)
Anesthesia Post Note  Patient: Edward Strickland  Procedure(s) Performed: Procedure(s) (LRB): TRANSCATHETER AORTIC VALVE REPLACEMENT, TRANSFEMORAL (N/A) TRANSESOPHAGEAL ECHOCARDIOGRAM (TEE) (N/A) INSERTION PLEURAL DRAINAGE CATHETER (Right)     Patient location during evaluation: SICU Anesthesia Type: General Level of consciousness: sedated and patient remains intubated per anesthesia plan Pain management: pain level controlled Vital Signs Assessment: post-procedure vital signs reviewed and stable Respiratory status: patient remains intubated per anesthesia plan Cardiovascular status: stable Postop Assessment: no apparent nausea or vomiting Anesthetic complications: no    Last Vitals:  Vitals:   07/24/17 0700 07/24/17 0800  BP: (!) 134/45 (!) 131/42  Pulse: 62 61  Resp: 19 20  Temp: 37 C 37 C  SpO2: 99% 98%    Last Pain:  Vitals:   07/24/17 0600  TempSrc:   PainSc: 0-No pain                 Cedra Villalon,Kyler TERRILL

## 2017-07-24 NOTE — NC FL2 (Signed)
[place] LEVEL OF CARE SCREENING TOOL     IDENTIFICATION  Patient Name: Edward Strickland Birthdate: [birth year] Sex: male Admission Date (Current Location): 07/09/2017  [facility] [place] Number:  Herbalist and Address:  [facility]. [facility], [place] [address] [place], [place] [address]      Provider Number: [id number]  Attending Physician Name and Address:  Rexene Alberts, MD  Relative Name and Phone Number:       Current Level of Care: Hospital Recommended Level of Care: [place] Prior Approval Number:    Date Approved/Denied:   PASRR Number: [id number]  Discharge Plan: SNF    Current Diagnoses: Patient Active Problem List   Diagnosis Date Noted  . S/P TAVR (transcatheter aortic valve replacement) 07/23/2017  . Severe calcific aortic stenosis 07/23/2017  . Coronary artery disease involving native coronary artery of native heart with unstable angina pectoris ([place])   . Pleural effusion   . Acute on chronic systolic CHF (congestive heart failure) ([place]) 07/09/2017  . Aortic stenosis 07/09/2017  . CKD (chronic kidney disease), stage III 07/09/2017  . Essential hypertension 07/09/2017  . COPD (chronic obstructive pulmonary disease) ([place]) 07/09/2017    Orientation RESPIRATION BLADDER Height & Weight     Self, Time, Situation, Place  Normal Incontinent, Indwelling catheter Weight: 139 lb 15.9 oz (63.5 kg) Height:  5\' 6"  (167.6 cm)  BEHAVIORAL SYMPTOMS/MOOD NEUROLOGICAL BOWEL NUTRITION STATUS      Continent Diet (see DC summary)  AMBULATORY STATUS COMMUNICATION OF NEEDS Skin   Limited Assist Verbally Surgical wounds (groin incisions with gauze dressing; chest wound site for pleurex drain- gauze dressing)                       Personal Care Assistance Level of Assistance  Bathing, Dressing Bathing Assistance: Limited assistance   Dressing Assistance: Limited assistance     Functional Limitations Info              SPECIAL CARE FACTORS FREQUENCY  PT (By licensed PT), OT (By licensed OT)     PT Frequency: 5/wk OT Frequency: 5/wk            Contractures      Additional Factors Info  Code Status, Allergies Code Status Info: DNR Allergies Info: NKA           Current Medications (07/24/2017):  This is the current hospital active medication list Current Facility-Administered Medications  Medication Dose Route Frequency Provider Last Rate Last Dose  . 0.9 %  sodium chloride infusion  250 mL Intravenous PRN Sherren Mocha, MD      . 0.9 %  sodium chloride infusion  250 mL Intravenous PRN Burnell Blanks, MD      . acetaminophen (TYLENOL) tablet 1,000 mg  1,000 mg Oral Q6H Burnell Blanks, MD   1,000 mg at 07/24/17 1255   Or  . acetaminophen (TYLENOL) solution 1,000 mg  1,000 mg Per Tube Q6H Burnell Blanks, MD      . acetaminophen (TYLENOL) solution 650 mg  650 mg Per Tube Once Burnell Blanks, MD       Or  . acetaminophen (TYLENOL) suppository 650 mg  650 mg Rectal Once Burnell Blanks, MD      . acetaminophen (TYLENOL) tablet 650 mg  650 mg Oral Q6H PRN Danford, Suann Larry, MD       Or  . acetaminophen (TYLENOL) suppository 650 mg  650 mg Rectal Q6H PRN Danford, Suann Larry, MD      . aminocaproic acid (AMICAR) oral solution 50 mg/mL (5%), 100 ml  10 mL Oral Q1H PRN Teena Dunk F, DDS   10 mL at 07/20/17 2130  . aspirin EC tablet 81 mg  81 mg Oral Daily Edwin Dada, MD   81 mg at 07/24/17 0943  . atorvastatin (LIPITOR) tablet 80 mg  80 mg Oral q1800 Eileen Stanford, PA-C   80 mg at 07/22/17 1821  . bacitracin ointment 1 application  1 application Topical W1U Lenn Cal, DDS   1 application at 93/23/55 0533  . cefUROXime (ZINACEF) 1.5 g in dextrose 5 % 50 mL IVPB  1.5 g Intravenous Q12H Burnell Blanks, MD   Stopped at 07/24/17 1012  . clopidogrel (PLAVIX) tablet 75 mg  75 mg Oral Daily Sherren Mocha, MD   75 mg at 07/24/17 0942  . dexmedetomidine (PRECEDEX) 200 MCG/50ML (4 mcg/mL) infusion  0.1-0.7 mcg/kg/hr Intravenous Continuous Burnell Blanks, MD   Stopped at 07/23/17 1400  . feeding supplement (ENSURE ENLIVE) (ENSURE ENLIVE) liquid 237 mL  237 mL Oral BID BM Sherren Mocha, MD   237 mL at 07/24/17 1000  . [START ON 07/25/2017] furosemide (LASIX) tablet 40 mg  40 mg Oral Daily Eileen Stanford, PA-C      . lactated ringers infusion 500 mL  500 mL Intravenous Once PRN Burnell Blanks, MD      . lactated ringers infusion   Intravenous Continuous Lenn Cal, DDS 10 mL/hr at 07/15/17 1451    . lidocaine (PF) (XYLOCAINE) 1 % injection    PRN Ardis Rowan, PA-C   10 mL at 07/11/17 1402  . magnesium hydroxide (MILK OF MAGNESIA) suspension 30 mL  30 mL Oral Daily PRN Burnell Blanks, MD      . metoprolol tartrate (LOPRESSOR) injection 2.5-5 mg  2.5-5 mg Intravenous Q2H PRN Burnell Blanks, MD      . midazolam (VERSED) injection 2 mg  2 mg Intravenous Q1H PRN Burnell Blanks, MD   2 mg at 07/23/17 1049  . morphine 4 MG/ML injection 2-5 mg  2-5 mg Intravenous Q1H PRN Burnell Blanks, MD      . nitroGLYCERIN 50 mg in dextrose 5 % 250 mL (0.2 mg/mL) infusion  0-100 mcg/min Intravenous Titrated Burnell Blanks, MD   Stopped at 07/23/17 1045  . norepinephrine (LEVOPHED) 4 mg in dextrose 5 % 250 mL (0.016 mg/mL) infusion  0-40 mcg/min Intravenous Titrated Rexene Alberts, MD 18.8 mL/hr at 07/24/17 0800 5 mcg/min at 07/24/17 0800  . ondansetron (ZOFRAN) injection 4 mg  4 mg Intravenous Q6H PRN Burnell Blanks, MD      . oxyCODONE (Oxy IR/ROXICODONE) immediate release tablet 5-10 mg  5-10 mg Oral Q3H PRN Burnell Blanks, MD      . oxyCODONE-acetaminophen (PERCOCET/ROXICET) 5-325 MG per tablet 1-2 tablet  1-2 tablet Oral Q6H PRN Lenn Cal, DDS      . [START ON 07/25/2017] pantoprazole (PROTONIX) EC  tablet 40 mg  40 mg Oral Daily Lauree Chandler D, MD      . phenylephrine (NEO-SYNEPHRINE) 20 mg in sodium chloride 0.9 % 250 mL (0.08 mg/mL) infusion  0-100 mcg/min Intravenous Continuous Rexene Alberts, MD   Stopped at 07/23/17 1330  . potassium chloride SA (K-DUR,KLOR-CON) CR tablet 20 mEq  20 mEq Oral Daily Eileen Stanford, PA-C  20 mEq at 07/24/17 0943  . sodium chloride flush (NS) 0.9 % injection 3 mL  3 mL Intravenous Q12H Sherren Mocha, MD   3 mL at 07/23/17 2201  . sodium chloride flush (NS) 0.9 % injection 3 mL  3 mL Intravenous PRN Sherren Mocha, MD      . sodium chloride flush (NS) 0.9 % injection 3 mL  3 mL Intravenous Q12H Burnell Blanks, MD   3 mL at 07/23/17 2201  . sodium chloride flush (NS) 0.9 % injection 3 mL  3 mL Intravenous PRN Burnell Blanks, MD      . traMADol Veatrice Bourbon) tablet 50-100 mg  50-100 mg Oral Q4H PRN Burnell Blanks, MD         Discharge Medications: Please see discharge summary for a list of discharge medications.  Relevant Imaging Results:  Relevant Lab Results:   Additional Information SS#: [id number]; will have pleurex drain to chest at time of DC  Zniya Cottone H, LCSW

## 2017-07-24 NOTE — Progress Notes (Signed)
      [address]       [place] [address]             [phone]      POD # 1 TAVR  Up in chair BP (!) 109/46   Pulse 80   Temp 98 F (36.7 C) (Oral)   Resp (!) 26   Ht 5\' 6"  (1.676 m)   Wt 139 lb 15.9 oz (63.5 kg)   SpO2 98%   BMI 22.60 kg/m   Intake/Output Summary (Last 24 hours) at 07/24/17 1748 Last data filed at 07/24/17 1500  Gross per 24 hour  Intake          1367.14 ml  Output             1775 ml  Net          -407.86 ml   Stable day, awaiting transfer to step down  Remo Lipps C. Roxan Hockey, MD [facility] [phone]

## 2017-07-24 NOTE — Clinical Social Work Note (Deleted)
Clinical Social Work Assessment  Patient Details  Name: Edward Strickland MRN: [id number] Date of Birth: 03/16/1941  Date of referral:  07/24/17               Reason for consult:  Facility Placement                Permission sought to share information with:  Facility Sport and exercise psychologist Permission granted to share information::  Yes, Verbal Permission Granted  Patient did not want CSW to call niece.  Name::        Agency::  SNF  Relationship::     Contact Information:     Housing/Transportation Living arrangements for the past 2 months:  [place] of Information:  Patient Patient Interpreter Needed:  None Criminal Activity/Legal Involvement Pertinent to Current Situation/Hospitalization:  No - Comment as needed Significant Relationships:  Significant Other, Other Family Members Lives with:  Facility Resident Do you feel safe going back to the place where you live?  No Need for family participation in patient care:  No (Coment)  Care giving concerns:  Pt lives at [facility] for [facility].  Per RN family has concerns about care at facility- feels as if facility is neglectful.   Social Worker assessment / plan:  CSW spoke with patient concerning discharge plan.  Patient confirmed he has been living at [facility] for past 19 months.  Family wants pt to move to [facility] in [place] explained that since patient is under Medicaid for LTC he would have to have [place] Medicaid prior to move to [place] facility.  Employment status:  Retired Forensic scientist:  Medicare PT Recommendations:  Not assessed at this time [place] / Referral to community resources:  [place]  Patient/Family's Response to care:  Pt agreeable to return to [facility] at discharge- long term goal will be to get home with his girlfriend in [place].  Patient did NOT want CSW to look into other options for LTC at this time- comfortable with return to [facility].  Patient/Family's Understanding  of and Emotional Response to Diagnosis, Current Treatment, and Prognosis:  No questions or concerns- hopeful he can work out soon for him to go home.  Emotional Assessment Appearance:  Appears stated age Attitude/Demeanor/Rapport:    Affect (typically observed):  Appropriate, Accepting Orientation:  Oriented to Self, Oriented to Place, Oriented to  Time, Oriented to Situation Alcohol / Substance use:  Not Applicable Psych involvement (Current and /or in the community):  No (Comment)  Discharge Needs  Concerns to be addressed:  Care Coordination Readmission within the last 30 days:  No Current discharge risk:  Physical Impairment Barriers to Discharge:  Continued Medical Work up   Jorge Ny, LCSW 07/24/2017, 4:18 PM

## 2017-07-24 NOTE — Progress Notes (Signed)
Patient Name: Edward Strickland Date of Encounter: 07/24/2017  Primary Cardiologist: Dr. Burt Knack- he may be able to establish in [facility] eventually  Hospital Problem List     Principal Problem:   S/P TAVR (transcatheter aortic valve replacement) Active Problems:   Acute on chronic systolic CHF (congestive heart failure) (HCC)   Aortic stenosis   CKD (chronic kidney disease), stage III   Essential hypertension   COPD (chronic obstructive pulmonary disease) (HCC)   Pleural effusion   Coronary artery disease involving native coronary artery of native heart with unstable angina pectoris (HCC)   Severe calcific aortic stenosis     Subjective   No complaints. Feeling well.   Inpatient Medications    Scheduled Meds: . acetaminophen  1,000 mg Oral Q6H   Or  . acetaminophen (TYLENOL) oral liquid 160 mg/5 mL  1,000 mg Per Tube Q6H  . acetaminophen (TYLENOL) oral liquid 160 mg/5 mL  650 mg Per Tube Once   Or  . acetaminophen  650 mg Rectal Once  . aspirin EC  81 mg Oral Daily  . atorvastatin  80 mg Oral q1800  . bacitracin  1 application Topical K3T  . clopidogrel  75 mg Oral Daily  . feeding supplement (ENSURE ENLIVE)  237 mL Oral BID BM  . furosemide  40 mg Oral Daily  . [START ON 07/25/2017] pantoprazole  40 mg Oral Daily  . potassium chloride  20 mEq Oral Daily  . sodium chloride flush  3 mL Intravenous Q12H  . sodium chloride flush  3 mL Intravenous Q12H   Continuous Infusions: . sodium chloride    . sodium chloride    . albumin human    . cefUROXime (ZINACEF)  IV Stopped (07/23/17 2025)  . dexmedetomidine (PRECEDEX) IV infusion Stopped (07/23/17 1400)  . lactated ringers    . lactated ringers 10 mL/hr at 07/15/17 1451  . nitroGLYCERIN Stopped (07/23/17 1045)  . norepinephrine (LEVOPHED) Adult infusion 5 mcg/min (07/24/17 0800)  . phenylephrine (NEO-SYNEPHRINE) Adult infusion Stopped (07/23/17 1330)   PRN Meds: sodium chloride, sodium chloride, acetaminophen **OR**  acetaminophen, albumin human, aminocaproic acid, lactated ringers, lidocaine (PF), magnesium hydroxide, metoprolol tartrate, midazolam, morphine injection, ondansetron (ZOFRAN) IV, oxyCODONE, oxyCODONE-acetaminophen, sodium chloride flush, sodium chloride flush, traMADol   Vital Signs    Vitals:   07/24/17 0500 07/24/17 0600 07/24/17 0700 07/24/17 0800  BP: (!) 125/42 (!) 133/43 (!) 134/45 (!) 131/42  Pulse: 63 60 62 61  Resp: 15 (!) 25 19 20   Temp: 98.6 F (37 C) 98.6 F (37 C) 98.6 F (37 C) 98.6 F (37 C)  TempSrc:      SpO2: 97% 98% 99% 98%  Weight: 139 lb 15.9 oz (63.5 kg)     Height: 5\' 6"  (1.676 m)       Intake/Output Summary (Last 24 hours) at 07/24/17 0902 Last data filed at 07/24/17 0800  Gross per 24 hour  Intake          2715.31 ml  Output             3800 ml  Net         -1084.69 ml   Filed Weights   07/22/17 0555 07/23/17 0419 07/24/17 0500  Weight: 138 lb 6.4 oz (62.8 kg) 138 lb (62.6 kg) 139 lb 15.9 oz (63.5 kg)    Physical Exam   GEN: Well nourished, well developed, in no acute distress. Thin, frail appreaing HEENT: Grossly normal.  Neck: Supple, no JVD, carotid upstrokes  delayed with bilateral bruits. no masses. Cardiac: RRR, no murmur. No rubs or gallops. No clubbing, cyanosis. No pretibial edema.  Radials/DP/PT 2+ and equal bilaterally.  Respiratory:  Respirations regular and unlabored, CTAB GI: Soft, nontender, nondistended, BS + x 4. MS: no deformity or atrophy. Groin sites with soft with no tenderness or ecchymosis Skin: warm and dry, no rash. No edema Neuro:  Strength and sensation are intact. Psych: AAOx3.  Normal affect.  Labs    CBC  Recent Labs  07/23/17 1030 07/23/17 1050 07/24/17 0338  WBC 9.7  --  11.2*  HGB 9.5* 9.5* 10.0*  HCT 28.8* 28.0* 30.3*  MCV 88.1  --  88.1  PLT 188  --  623   Basic Metabolic Panel  Recent Labs  07/23/17 0456  07/23/17 0856 07/23/17 1050 07/24/17 0338  NA 134*  < > 136 137 135  K 4.2  < > 4.2  4.2 4.0  CL 100*  < > 100*  --  103  CO2 27  --   --   --  25  GLUCOSE 94  < > 117* 124* 109*  BUN 24*  < > 22*  --  15  CREATININE 0.97  < > 0.90  --  0.89  CALCIUM 8.8*  --   --   --  8.3*  MG  --   --   --   --  1.7  < > = values in this interval not displayed. Liver Function Tests  Recent Labs  07/22/17 0353  AST 37  ALT 30  ALKPHOS 63  BILITOT 0.6  PROT 6.2*  ALBUMIN 2.8*   No results for input(s): LIPASE, AMYLASE in the last 72 hours. Cardiac Enzymes No results for input(s): CKTOTAL, CKMB, CKMBINDEX, TROPONINI in the last 72 hours. BNP Invalid input(s): POCBNP D-Dimer No results for input(s): DDIMER in the last 72 hours. Hemoglobin A1C No results for input(s): HGBA1C in the last 72 hours. Fasting Lipid Panel No results for input(s): CHOL, HDL, LDLCALC, TRIG, CHOLHDL, LDLDIRECT in the last 72 hours. Thyroid Function Tests No results for input(s): TSH, T4TOTAL, T3FREE, THYROIDAB in the last 72 hours.  Invalid input(s): FREET3  Telemetry    NSR with few PACs - Personally Reviewed  ECG    sinus with 1st deg AV block, LVH with repol abnormality - Personally Reviewed  Radiology    Dg Chest Port 1 View  Result Date: 07/24/2017 CLINICAL DATA:  Sore chest after TAVR EXAM: PORTABLE CHEST 1 VIEW COMPARISON:  07/23/2017 FINDINGS: Stable cardiomegaly when allowing for differences in projection. There is a pleural catheter at the right base. Vascular congestion. No evidence of pneumothorax or air bronchogram. Trace pleural effusions. Right IJ central line with tip at the SVC level. There has been tracheal and esophageal extubation. Aortic valve replacement cage. IMPRESSION: 1. Lower volumes after extubation. 2. Pulmonary vascular congestion and trace effusions. Electronically Signed   By: Monte Fantasia M.D.   On: 07/24/2017 07:19   Dg Chest Port 1 View  Result Date: 07/23/2017 CLINICAL DATA:  Aortic valve repair. History of COPD, CHF, hypertension. Coronary artery  disease . EXAM: PORTABLE CHEST 1 VIEW COMPARISON:  Chest x-ray 07/22/2017 . FINDINGS: Endotracheal tube tip 2 cm above the carina. Right IJ line tip noted over the superior vena cava. NG tube tip noted over the upper chest. Repositioning should be considered. Right chest tube noted over the lower chest. Aortic valve repair. Cardiomegaly with mild pulmonary venous congestion. Small right pleural effusion.  Right pleural effusion is improved from 07/22/2017 . IMPRESSION: 1. NG tube tip noted over the upper chest most likely in the upper esophagus, repositioning should be considered. Advancement of 20-25 cm should be considered. Endotracheal tube with and right IJ line in good anatomic position. 2. Right chest tube noted over the lower right chest. Significant resolution of right pleural effusion . 3. Prior aortic valve repair. Cardiomegaly with mild pulmonary venous congestion. These results will be called to the ordering clinician or representative by the Radiologist Assistant, and communication documented in the PACS or zVision Dashboard. Electronically Signed   By: Marcello Moores  Register   On: 07/23/2017 11:36    Cardiac Studies   2D ECHO: 07/09/2017 LV EF: 25% -   30% Study Conclusion - Left ventricle: The cavity size was mildly dilated. Wall   thickness was increased in a pattern of mild LVH. Systolic   function was severely reduced. The estimated ejection fraction   was in the range of 25% to 30%. Diffuse hypokinesis. - Aortic valve: Valve mobility was restricted. There was severe   stenosis. There was mild to moderate regurgitation. Valve area   (VTI): 0.41 cm^2. Valve area (Vmax): 0.35 cm^2. Valve area   (Vmean): 0.39 cm^2. - Left atrium: The atrium was moderately dilated. - Right ventricle: The cavity size was moderately dilated. Systolic   function was moderately reduced. - Right atrium: The atrium was moderately dilated. - Pulmonary arteries: Systolic pressure was severely increased. PA   peak  pressure: 79 mm Hg (S). - Pericardium, extracardiac: There was a left pleural effusion. Impressions: - Severe global reduction in LV systolic function; mild LVE and   LVH; calcified aortic valve with severe AS and mild to moderate   AI; moderate LAE; moderate RVE with moderately reduced function;   mild TR with severely elevated pulmonary pressure.   07/10/17 RIGHT HEART CATH AND CORONARY ANGIOGRAPHY  Conclusion  1. Severe single-vessel coronary artery disease with severe stenosis in the mid LAD and otherwise mild diffuse nonobstructive calcific coronary disease 2. Known severe aortic stenosis with very heavy calcification of the aortic valve on plain fluoroscopy and severely restricted aortic leaflet mobility 3. Severe pulmonary hypertension likely secondary to left heart disease with severely elevated pulmonary capillary wedge pressure  Recommendation: Continued evaluation by the multidisciplinary heart valve team. Likely proceed with PCI of the LAD followed by TAVR pending cardiac surgical consultation    Cardiac CT 07/12/17 IMPRESSION: 1. Trileaflet aortic valve with heavily calcified leaflets and severe leaflet opening restriction. There are significant asymmetric calcifications extending into the LVOT under the non-coronary leaflet. Annular measurements suitable for delivery of a 29 mm Edwards-SAPIEN valve. 2. Sufficient annulus to coronary distance. 3. Optimum Fluoroscopic Angle for Delivery:  LAU 7 CAU 7. 4. No Thrombus in the left atrial appendage. 5. Dilated pulmonary artery measuring 33 x 32 mm suggestive of pulmonary hypertension.   Carotid doppler 07/14/17 Summary: Bilateral: mild soft plaque CCA. Mild mixed plaque origin and proximal ICA. 1-39% ICA plaquing. vertebral artery flow is antegrade.   CT angio abdomen/pelvis/chest: 07/12/17 IMPRESSION: 1. Vascular findings and measurements pertinent to potential TAVR procedure, as detailed above. This patient does  have suitable pelvic arterial access bilaterally. 2. Severe thickening calcification of the aortic valve, compatible with the reported clinical history of severe aortic stenosis. 3. Findings in the thorax suggestive of underlying congestive heart failure, including moderate pulmonary edema, moderate right and small a moderate left pleural effusions, and cardiomegaly. 4. Aortic atherosclerosis, in addition to  left anterior descending coronary artery disease. Assessment for potential risk factor modification, dietary therapy or pharmacologic therapy may be warranted, if clinically indicated. 5. Colonic diverticulosis without evidence to suggest an acute diverticulitis at this time. 6. Additional incidental findings, as above. Aortic Atherosclerosis (ICD10-I70.0).   07/14/17 CORONARY STENT INTERVENTION  Conclusion  1. Severe stenosis mid LAD 2. Successful PTCA/DES x 1 mid LAD  Recommendations: Will continue DAPT with ASA and Plavix for at least 3 months but longer if he tolerates. Continue planning for TAVR.      Patient Profile     Edward Strickland is a 76 y.o. male with a hx of HTN, aortic stenosis, and COPD who was transferred to [facility] from [place] on 07/09/17 for further work up of his severe AS and new systolic CHF.   Assessment & Plan    Severe symptomatic stage D AS: s/p successful TAVR with 40mm Edwards Sapien valve via R TF approach. Groin sites stable. ECG with sinus and 1st degree AV block. Post operative echo pending. Still on levophed but plan to wean this off. Okay to DC foley. If BP remains stable, we will discontinue arterial line and transfer to floor this afternoon. Continue ASA/plavix  New systolic CHF: EF 16-10% by echo. Likely LV dilation 2/2 severe AS. He was diuresed IV lasix. Net neg 11.2L and weight down 11 lbs ( 150--> 139) since admission. Now euvolemic. Continue lasix 40mg  daily.   CAD: cath 9/12 showed 90% mLAD stenosis. He underwent PCI/DEs of the LAD on  9/19. Continue ASA/plavix and atorvastatin 80mg  daily. BB discontinued 2/2 soft BPs  Right pleural effusion: s/p IR thoracentesis 9/13 which drained 2 L from the right pleural space. Another 1.5 L drained in OR yesterday and Pleur-X tube placed. We will make sure he has canisters at discharge and will start draining it tomorrow then QOD.  AKI: creat now stablazied.   COPD: PFTS showed severe obstruction   HTN: BP soft but stable. Continue to monitor   Dental: s/p multiple dental extractions 9/17 by Dr. Enrique Sack. Has had some oozing that is now better controlled.  Dispo: he does not have a lot of social support and will need short term placement in a SNF at discharge. HHRN will be arranged for pleur-X draining.    Signed, Angelena Form, PA-C  07/24/2017, 9:02 AM  Pager [phone]   I have seen and examined the patient and agree with the assessment and plan as outlined.  Overall looks good POD1 TAVR.  Maintaining NSR w/ stable BP although still on very low dose levophed.  Will hold diuretics today.  Routine f/u ECHO today.  Possibly ready for transfer step down later today or tomorrow once stable off levophed.  Rexene Alberts, MD 07/24/2017 9:21 AM

## 2017-07-24 NOTE — Progress Notes (Signed)
Echocardiogram 2D Echocardiogram has been performed.  Edward Strickland 07/24/2017, 10:34 AM

## 2017-07-24 NOTE — Progress Notes (Signed)
Physical Therapy Treatment Patient Details Name: Edward Strickland MRN: [id number] DOB: [birth year] Today's Date: 07/24/2017    History of Present Illness Pt is a 76 yo male who presented to ED with SOB and LE swelling, recently diagnosed with critical aortic stenosis, s/p TAVR 07/23/17. PMH for HTN, CKD, and CHF.     PT Comments    Pt is s/p TAVR 07/23/17 and has had a slight decrease in his mobility as a result due to generalized weakness and fatigue. Pt is currently, minA for bed mobility, min guard for transfers and minA for ambulation of 40 feet with RW. PT fully expects pt to rebound but feels with current mobility he would be best served with discharge to a SNF for improving his strength and endurance to safely return to his home environment in the future.    Follow Up Recommendations  SNF     Equipment Recommendations  None recommended by PT    Recommendations for Other Services       Precautions / Restrictions Precautions Precautions: Fall Restrictions Weight Bearing Restrictions: No    Mobility  Bed Mobility Overal bed mobility: Needs Assistance Bed Mobility: Supine to Sit     Supine to sit: Min assist     General bed mobility comments: minA for trunk to upright and pad scoot to EoB  Transfers Overall transfer level: Needs assistance Equipment used: Rolling walker (2 wheeled) Transfers: Sit to/from Stand Sit to Stand: Min guard         General transfer comment: min guard for safety, vc for hand placement  Ambulation/Gait Ambulation/Gait assistance: Min assist Ambulation Distance (Feet): 40 Feet Assistive device: Rolling walker (2 wheeled) Gait Pattern/deviations: Step-through pattern;Decreased stride length;Trunk flexed Gait velocity: slowed Gait velocity interpretation: Below normal speed for age/gender General Gait Details: minA for steadying with RW, vc for upright posture and stayin within RW for gait, pt continues with vaulting gait due to L LE knee  contracture,         Balance Overall balance assessment: Needs assistance Sitting-balance support: No upper extremity supported;Feet supported Sitting balance-Leahy Scale: Fair     Standing balance support: During functional activity;Single extremity supported Standing balance-Leahy Scale: Fair Standing balance comment: Requires UE support in standing                            Cognition Arousal/Alertness: Awake/alert Behavior During Therapy: WFL for tasks assessed/performed Overall Cognitive Status: Within Functional Limits for tasks assessed                                           General Comments General comments (skin integrity, edema, etc.): prior to activity BP 110/58 , SaO2 on RA 100%O2, HR 78bpm, RR 22 bpm, during ambulation SaO2 >93%O2 and HR max of 105 bpm, after ambulation BP 120/60, SaO2 100%O2, and HR 81 bpm      Pertinent Vitals/Pain Pain Assessment: No/denies pain           PT Goals (current goals can now be found in the care plan section) Acute Rehab PT Goals PT Goal Formulation: With patient Time For Goal Achievement: 08/06/17 Potential to Achieve Goals: Good Progress towards PT goals: Progressing toward goals    Frequency    Min 3X/week      PT Plan Discharge plan needs to be updated  AM-PAC PT "6 Clicks" Daily Activity  Outcome Measure  Difficulty turning over in bed (including adjusting bedclothes, sheets and blankets)?: A Lot Difficulty moving from lying on back to sitting on the side of the bed? : Unable Difficulty sitting down on and standing up from a chair with arms (e.g., wheelchair, bedside commode, etc,.)?: A Lot Help needed moving to and from a bed to chair (including a wheelchair)?: A Little Help needed walking in hospital room?: A Little Help needed climbing 3-5 steps with a railing? : A Lot 6 Click Score: 13    End of Session Equipment Utilized During Treatment: Gait belt Activity  Tolerance: Patient tolerated treatment well Patient left: in chair;with call bell/phone within reach Nurse Communication: Mobility status PT Visit Diagnosis: Other abnormalities of gait and mobility (R26.89);Muscle weakness (generalized) (M62.81);Difficulty in walking, not elsewhere classified (R26.2)     Time: 3383-2919 PT Time Calculation (min) (ACUTE ONLY): 20 min  Charges:  $Gait Training: 8-22 mins                    G Codes:       Demyan Fugate B. Migdalia Dk PT, DPT Acute Rehabilitation  [phone] Pager [phone]     [place] 07/24/2017, 4:39 PM

## 2017-07-24 NOTE — Care Management Note (Addendum)
Case Management Note  Patient Details  Name: Edward Strickland MRN: [id number] Date of Birth: 1941-06-13  Subjective/Objective:  From home alone, s/p coronary stent intervention, will be on plavix, per pt eval rec HHPT,  NCM spoke with patient and offered choice, he stated "No, No, I do not want HH, I have a friend who is with me and will help me, and he then preceded to hand the NCM the phone, there was a lady on the phone who gave me [name] 's phone number cell [phone] and home is [phone].  She states she helps patient out a lot because he took care of her mother.   9/20 [place], BSN - he is s/p coronary stent intervention, he has severe AS and new syst CHF,planning for Valve surgery on 9/25.  PT will need to reassess him after surgery.    9/26 [place], BSN - SP TAVR  , will need Pleurx drainage catheter, Plan is for SNF, CSW following for placement.  Patient will need to have drains to go with him to SNF and SNF will have to be able to have capability to do drains.  Forms will need to be faxed and completed when drainage starts which should be on 9/27 and when we find out what SNF patient is going to.  PT to  Re evaluate patient today.  Patient will be going to [place] at [facility].                             Action/Plan: NCM will follow for dc needs.   Expected Discharge Date:  07/11/17               Expected Discharge Plan:  Skilled Nursing Facility  In-House Referral:  Clinical Social Work  Discharge planning Services  CM Consult  Post Acute Care Choice:  Home Health Choice offered to:  Patient  DME Arranged:    DME Agency:     HH Arranged:  Patient Refused [place] Agency:     Status of Service:  Completed, signed off  If discussed at [employer], dates discussed:    Additional Comments:  Zenon Mayo, RN 07/24/2017, 3:53 PM

## 2017-07-25 ENCOUNTER — Inpatient Hospital Stay (HOSPITAL_COMMUNITY): Payer: Medicare Other

## 2017-07-25 DIAGNOSIS — Z954 Presence of other heart-valve replacement: Secondary | ICD-10-CM

## 2017-07-25 DIAGNOSIS — I350 Nonrheumatic aortic (valve) stenosis: Secondary | ICD-10-CM

## 2017-07-25 LAB — BASIC METABOLIC PANEL
ANION GAP: 8 (ref 5–15)
BUN: 14 mg/dL (ref 6–20)
CO2: 25 mmol/L (ref 22–32)
Calcium: 8 mg/dL — ABNORMAL LOW (ref 8.9–10.3)
Chloride: 103 mmol/L (ref 101–111)
Creatinine, Ser: 0.9 mg/dL (ref 0.61–1.24)
GFR calc Af Amer: >60 mL/min (ref >60)
GLUCOSE: 77 mg/dL (ref 65–99)
POTASSIUM: 4.1 mmol/L (ref 3.5–5.1)
Sodium: 136 mmol/L (ref 135–145)

## 2017-07-25 LAB — MAGNESIUM: MAGNESIUM: 2 mg/dL (ref 1.7–2.4)

## 2017-07-25 MED ORDER — LISINOPRIL 10 MG PO TABS: 10.0000 mg | ORAL_TABLET | Freq: Every day | ORAL | Status: DC

## 2017-07-25 MED FILL — Heparin Sodium (Porcine) Inj 1000 Unit/ML: INTRAMUSCULAR | Qty: 30 | Status: AC

## 2017-07-25 MED FILL — Magnesium Sulfate Inj 50%: INTRAMUSCULAR | Qty: 10 | Status: AC

## 2017-07-25 MED FILL — Potassium Chloride Inj 2 mEq/ML: INTRAVENOUS | Qty: 40 | Status: AC

## 2017-07-25 NOTE — Progress Notes (Addendum)
550 cc red bloody fluid drained from Pleurex catheter. Nell Range PA notified at 10:00am and will let Dr Roxy Manns know.

## 2017-07-25 NOTE — Clinical Social Work Note (Signed)
Clinical Social Work Assessment  Patient Details  Name: Edward Strickland MRN: [id number] Date of Birth: 07/25/1941  Date of referral:  07/24/17               Reason for consult:  Facility Placement                Permission sought to share information with:  Facility Art therapist granted to share information::  Yes, Verbal Permission Granted  Name::        Agency::  SNF  Relationship::     Contact Information:     Housing/Transportation Living arrangements for the past 2 months:  Single Family Home Source of Information:  Patient Patient Interpreter Needed:  None Criminal Activity/Legal Involvement Pertinent to Current Situation/Hospitalization:  No - Comment as needed Significant Relationships:  Significant Other, Adult Children Lives with:  Self Do you feel safe going back to the place where you live?  No Need for family participation in patient care:  No (Coment)  Care giving concerns:  Pt lives at home alone and would not have enough family support to return home safely at this time.   Social Worker assessment / plan:  CSW spoke with pt concerning PT recommendation for SNF- explained SNF and SNF referral process.  Employment status:  Retired Forensic scientist:  Medicare PT Recommendations:  [place] / Referral to community resources:  [place]  Patient/Family's Response to care:  Patient agreeable to SNF at DC- understands his current condition would be too much to manage at home.  Patient/Family's Understanding of and Emotional Response to Diagnosis, Current Treatment, and Prognosis:  No questions or concerns at this time- hopeful for return home soon.  Emotional Assessment Appearance:  Appears stated age Attitude/Demeanor/Rapport:    Affect (typically observed):  Appropriate, Accepting Orientation:  Oriented to Self, Oriented to Place, Oriented to  Time, Oriented to Situation Alcohol / Substance use:   Not Applicable Psych involvement (Current and /or in the community):  No (Comment)  Discharge Needs  Concerns to be addressed:  Care Coordination Readmission within the last 30 days:  No Current discharge risk:  Physical Impairment Barriers to Discharge:  Continued Medical Work up   Jorge Ny, LCSW 07/25/2017, 11:02 AM

## 2017-07-25 NOTE — Progress Notes (Addendum)
Patient accept at [facility]- confirmed they can manage pt pleurex drain- faxed them instructions for care and informed pt will be sent with 9 drains  Anticipate possible DC tomorrow  CSW will continue to follow  Jorge Ny, [place] Social Worker [phone]

## 2017-07-25 NOTE — Progress Notes (Addendum)
Patient Name: Edward Strickland Date of Encounter: 07/25/2017  Primary Cardiologist: Dr. Burt Knack- he may be able to establish in [facility] eventually  Hospital Problem List     Principal Problem:   S/P TAVR (transcatheter aortic valve replacement) Active Problems:   Acute on chronic systolic CHF (congestive heart failure) (HCC)   Aortic stenosis   CKD (chronic kidney disease), stage III   Essential hypertension   COPD (chronic obstructive pulmonary disease) (HCC)   Pleural effusion   Coronary artery disease involving native coronary artery of native heart with unstable angina pectoris (HCC)   Severe calcific aortic stenosis    Subjective   No complaints. Feeling well.   Inpatient Medications    Scheduled Meds: . acetaminophen  1,000 mg Oral Q6H   Or  . acetaminophen (TYLENOL) oral liquid 160 mg/5 mL  1,000 mg Per Tube Q6H  . acetaminophen (TYLENOL) oral liquid 160 mg/5 mL  650 mg Per Tube Once   Or  . acetaminophen  650 mg Rectal Once  . aspirin EC  81 mg Oral Daily  . atorvastatin  80 mg Oral q1800  . bacitracin  1 application Topical X5M  . Chlorhexidine Gluconate Cloth  6 each Topical Daily  . clopidogrel  75 mg Oral Daily  . feeding supplement (ENSURE ENLIVE)  237 mL Oral BID BM  . furosemide  40 mg Oral Daily  . pantoprazole  40 mg Oral Daily  . potassium chloride  20 mEq Oral Daily  . sodium chloride flush  3 mL Intravenous Q12H  . sodium chloride flush  3 mL Intravenous Q12H   Continuous Infusions: . sodium chloride 250 mL (07/24/17 2123)  . sodium chloride    . cefUROXime (ZINACEF)  IV 1.5 g (07/25/17 0804)  . dexmedetomidine (PRECEDEX) IV infusion Stopped (07/23/17 1400)  . lactated ringers    . lactated ringers 10 mL/hr at 07/15/17 1451  . nitroGLYCERIN Stopped (07/23/17 1045)  . norepinephrine (LEVOPHED) Adult infusion 5 mcg/min (07/24/17 0800)  . phenylephrine (NEO-SYNEPHRINE) Adult infusion Stopped (07/23/17 1330)   PRN Meds: sodium chloride, sodium  chloride, acetaminophen **OR** acetaminophen, aminocaproic acid, lactated ringers, lidocaine (PF), magnesium hydroxide, metoprolol tartrate, midazolam, morphine injection, ondansetron (ZOFRAN) IV, oxyCODONE, oxyCODONE-acetaminophen, sodium chloride flush, sodium chloride flush, traMADol   Vital Signs    Vitals:   07/25/17 0400 07/25/17 0455 07/25/17 0500 07/25/17 0600  BP: (!) 100/51  (!) 110/52 (!) 118/52  Pulse: 74  77 68  Resp: 18  20 19   Temp: 97.8 F (36.6 C)     TempSrc: Oral     SpO2: 96%  96% 99%  Weight:  143 lb 15.4 oz (65.3 kg)    Height:        Intake/Output Summary (Last 24 hours) at 07/25/17 0814 Last data filed at 07/25/17 0600  Gross per 24 hour  Intake          1076.17 ml  Output             1250 ml  Net          -173.83 ml   Filed Weights   07/23/17 0419 07/24/17 0500 07/25/17 0455  Weight: 138 lb (62.6 kg) 139 lb 15.9 oz (63.5 kg) 143 lb 15.4 oz (65.3 kg)    Physical Exam   GEN: Well nourished, well developed, in no acute distress. Thin, frail appreaing HEENT: Grossly normal.  Neck: Supple, no JVD, carotid upstrokes delayed with bilateral bruits. no masses. Cardiac: RRR, no murmur. No rubs  or gallops. No clubbing, cyanosis. No pretibial edema.  Radials/DP/PT 2+ and equal bilaterally.  Respiratory:  Respirations regular and unlabored, CTAB GI: Soft, nontender, nondistended, BS + x 4. MS: no deformity or atrophy. Groin sites with soft with no tenderness or ecchymosis Skin: warm and dry, no rash. No edema Neuro:  Strength and sensation are intact. Psych: AAOx3.  Normal affect.  Labs    CBC  Recent Labs  07/23/17 1030 07/23/17 1050 07/24/17 0338  WBC 9.7  --  11.2*  HGB 9.5* 9.5* 10.0*  HCT 28.8* 28.0* 30.3*  MCV 88.1  --  88.1  PLT 188  --  694   Basic Metabolic Panel  Recent Labs  07/23/17 0456  07/23/17 0856 07/23/17 1050 07/24/17 0338  NA 134*  < > 136 137 135  K 4.2  < > 4.2 4.2 4.0  CL 100*  < > 100*  --  103  CO2 27  --   --    --  25  GLUCOSE 94  < > 117* 124* 109*  BUN 24*  < > 22*  --  15  CREATININE 0.97  < > 0.90  --  0.89  CALCIUM 8.8*  --   --   --  8.3*  MG  --   --   --   --  1.7  < > = values in this interval not displayed. Liver Function Tests No results for input(s): AST, ALT, ALKPHOS, BILITOT, PROT, ALBUMIN in the last 72 hours. No results for input(s): LIPASE, AMYLASE in the last 72 hours. Cardiac Enzymes No results for input(s): CKTOTAL, CKMB, CKMBINDEX, TROPONINI in the last 72 hours. BNP Invalid input(s): POCBNP D-Dimer No results for input(s): DDIMER in the last 72 hours. Hemoglobin A1C No results for input(s): HGBA1C in the last 72 hours. Fasting Lipid Panel No results for input(s): CHOL, HDL, LDLCALC, TRIG, CHOLHDL, LDLDIRECT in the last 72 hours. Thyroid Function Tests No results for input(s): TSH, T4TOTAL, T3FREE, THYROIDAB in the last 72 hours.  Invalid input(s): FREET3  Telemetry    NSR with few PACs - Personally Reviewed  ECG    sinus with 1st deg AV block, LVH with repol abnormality - Personally Reviewed  Radiology    Dg Chest Port 1 View  Result Date: 07/24/2017 CLINICAL DATA:  Sore chest after TAVR EXAM: PORTABLE CHEST 1 VIEW COMPARISON:  07/23/2017 FINDINGS: Stable cardiomegaly when allowing for differences in projection. There is a pleural catheter at the right base. Vascular congestion. No evidence of pneumothorax or air bronchogram. Trace pleural effusions. Right IJ central line with tip at the SVC level. There has been tracheal and esophageal extubation. Aortic valve replacement cage. IMPRESSION: 1. Lower volumes after extubation. 2. Pulmonary vascular congestion and trace effusions. Electronically Signed   By: Monte Fantasia M.D.   On: 07/24/2017 07:19   Dg Chest Port 1 View  Result Date: 07/23/2017 CLINICAL DATA:  Aortic valve repair. History of COPD, CHF, hypertension. Coronary artery disease . EXAM: PORTABLE CHEST 1 VIEW COMPARISON:  Chest x-ray 07/22/2017 .  FINDINGS: Endotracheal tube tip 2 cm above the carina. Right IJ line tip noted over the superior vena cava. NG tube tip noted over the upper chest. Repositioning should be considered. Right chest tube noted over the lower chest. Aortic valve repair. Cardiomegaly with mild pulmonary venous congestion. Small right pleural effusion. Right pleural effusion is improved from 07/22/2017 . IMPRESSION: 1. NG tube tip noted over the upper chest most likely in the upper  esophagus, repositioning should be considered. Advancement of 20-25 cm should be considered. Endotracheal tube with and right IJ line in good anatomic position. 2. Right chest tube noted over the lower right chest. Significant resolution of right pleural effusion . 3. Prior aortic valve repair. Cardiomegaly with mild pulmonary venous congestion. These results will be called to the ordering clinician or representative by the Radiologist Assistant, and communication documented in the PACS or zVision Dashboard. Electronically Signed   By: Marcello Moores  Register   On: 07/23/2017 11:36    Cardiac Studies   2D ECHO: 07/09/2017 LV EF: 25% -   30% Study Conclusion - Left ventricle: The cavity size was mildly dilated. Wall   thickness was increased in a pattern of mild LVH. Systolic   function was severely reduced. The estimated ejection fraction   was in the range of 25% to 30%. Diffuse hypokinesis. - Aortic valve: Valve mobility was restricted. There was severe   stenosis. There was mild to moderate regurgitation. Valve area   (VTI): 0.41 cm^2. Valve area (Vmax): 0.35 cm^2. Valve area   (Vmean): 0.39 cm^2. - Left atrium: The atrium was moderately dilated. - Right ventricle: The cavity size was moderately dilated. Systolic   function was moderately reduced. - Right atrium: The atrium was moderately dilated. - Pulmonary arteries: Systolic pressure was severely increased. PA   peak pressure: 79 mm Hg (S). - Pericardium, extracardiac: There was a left  pleural effusion. Impressions: - Severe global reduction in LV systolic function; mild LVE and   LVH; calcified aortic valve with severe AS and mild to moderate   AI; moderate LAE; moderate RVE with moderately reduced function;   mild TR with severely elevated pulmonary pressure.   07/10/17 RIGHT HEART CATH AND CORONARY ANGIOGRAPHY  Conclusion  1. Severe single-vessel coronary artery disease with severe stenosis in the mid LAD and otherwise mild diffuse nonobstructive calcific coronary disease 2. Known severe aortic stenosis with very heavy calcification of the aortic valve on plain fluoroscopy and severely restricted aortic leaflet mobility 3. Severe pulmonary hypertension likely secondary to left heart disease with severely elevated pulmonary capillary wedge pressure  Recommendation: Continued evaluation by the multidisciplinary heart valve team. Likely proceed with PCI of the LAD followed by TAVR pending cardiac surgical consultation    Cardiac CT 07/12/17 IMPRESSION: 1. Trileaflet aortic valve with heavily calcified leaflets and severe leaflet opening restriction. There are significant asymmetric calcifications extending into the LVOT under the non-coronary leaflet. Annular measurements suitable for delivery of a 29 mm Edwards-SAPIEN valve. 2. Sufficient annulus to coronary distance. 3. Optimum Fluoroscopic Angle for Delivery:  LAU 7 CAU 7. 4. No Thrombus in the left atrial appendage. 5. Dilated pulmonary artery measuring 33 x 32 mm suggestive of pulmonary hypertension.   Carotid doppler 07/14/17 Summary: Bilateral: mild soft plaque CCA. Mild mixed plaque origin and proximal ICA. 1-39% ICA plaquing. vertebral artery flow is antegrade.   CT angio abdomen/pelvis/chest: 07/12/17 IMPRESSION: 1. Vascular findings and measurements pertinent to potential TAVR procedure, as detailed above. This patient does have suitable pelvic arterial access bilaterally. 2. Severe thickening  calcification of the aortic valve, compatible with the reported clinical history of severe aortic stenosis. 3. Findings in the thorax suggestive of underlying congestive heart failure, including moderate pulmonary edema, moderate right and small a moderate left pleural effusions, and cardiomegaly. 4. Aortic atherosclerosis, in addition to left anterior descending coronary artery disease. Assessment for potential risk factor modification, dietary therapy or pharmacologic therapy may be warranted, if clinically indicated.  5. Colonic diverticulosis without evidence to suggest an acute diverticulitis at this time. 6. Additional incidental findings, as above. Aortic Atherosclerosis (ICD10-I70.0).   07/14/17 CORONARY STENT INTERVENTION  Conclusion  1. Severe stenosis mid LAD 2. Successful PTCA/DES x 1 mid LAD  Recommendations: Will continue DAPT with ASA and Plavix for at least 3 months but longer if he tolerates. Continue planning for TAVR.    Post operative echo 07/24/17 - Left ventricle: Mid an basal inferior wall akinesis The cavity   size was mildly dilated. There was mild concentric hypertrophy.   Systolic function was moderately reduced. The estimated ejection   fraction was in the range of 35% to 40%. Doppler parameters are   consistent with abnormal left ventricular relaxation (grade 1   diastolic dysfunction). - Aortic valve: 29 mm Sapien 3 valve with moderate peri valvular   regurgitation. Valve area (VTI): 2.02 cm^2. Valve area (Vmax):   1.64 cm^2. Valve area (Vmean): 1.96 cm^2. - Left atrium: The atrium was mildly dilated. - Atrial septum: No defect or patent foramen ovale was identified. - Pulmonary arteries: PA peak pressure: 41 mm Hg (S).  Patient Profile     Edward Strickland is a 76 y.o. male with a hx of HTN, aortic stenosis, and COPD who was transferred to [facility] from [place] on 07/09/17 for further work up of his severe AS and new systolic CHF.   Assessment & Plan      Severe symptomatic stage D AS: s/p successful TAVR with 26mm Edwards Sapien valve via R TF approach on 9/25. Groin sites stable. ECG with sinus and 1st degree AV block. Post operative shows moderate PVL and EF 35-40%. Will discontinue central line and transfer him to the floor. Continue ASA/plavix  New systolic CHF: EF 16-10% by echo on admission. Repeat echo s/p TAVR shows improvement of EF to 35-40%. Now euvolemic. Will resume lasix 40mg  daily, which was held yesterday given hypotension and draining of large amounts of pleural fluid in the OR.  CAD: cath 9/12 showed 90% mLAD stenosis. He underwent PCI/DEs of the LAD on 9/19. Continue ASA/plavix and atorvastatin 80mg  daily. BB discontinued 2/2 soft BPs.   Right pleural effusion: s/p IR thoracentesis 9/13 which drained 2 L from the right pleural space. Another 1.5 L drained in OR 9/25 and Pleur-X tube placed. We will make sure he has canisters at discharge and will start draining it today and then QOD.  AKI: creat now stablazied. BMET today pending.   COPD: PFTS showed severe obstruction   HTN: BP improved today. Continue to monitor   Dental: s/p multiple dental extractions 9/17 by Dr. Enrique Sack. Has had some oozing that is now better controlled.  Dispo: he does not have a lot of social support and will need short term placement in a SNF at discharge. HHRN will be arranged for pleur-X draining.    Signed, Angelena Form, PA-C  07/25/2017, 8:14 AM  Pager [phone]  I have seen and examined the patient and agree with the assessment and plan as outlined.  Clinically doing remarkably well.  POD1 echo shows moderate paravalvular leak, as expected.  Expected post op acute blood loss anemia remains stable.  Anticipate likely d/c tomorrow if suitable arrangements in place.  Patient will need SNF placement and has expressed an interest in [facility] in [place].  He will need arrangements for PleurX catheter to be drained 3x/week.  Will continue to hold  beta blocker and ACE-I for now since BP remains borderline low.  Rexene Alberts, MD 07/25/2017 8:34 AM

## 2017-07-26 ENCOUNTER — Other Ambulatory Visit: Payer: Self-pay

## 2017-07-26 ENCOUNTER — Encounter (HOSPITAL_COMMUNITY): Payer: Self-pay | Admitting: Physician Assistant

## 2017-07-26 ENCOUNTER — Encounter: Payer: Self-pay | Admitting: Thoracic Surgery (Cardiothoracic Vascular Surgery)

## 2017-07-26 DIAGNOSIS — I350 Nonrheumatic aortic (valve) stenosis: Secondary | ICD-10-CM | POA: Diagnosis not present

## 2017-07-26 DIAGNOSIS — R278 Other lack of coordination: Secondary | ICD-10-CM | POA: Diagnosis not present

## 2017-07-26 DIAGNOSIS — M6281 Muscle weakness (generalized): Secondary | ICD-10-CM | POA: Diagnosis not present

## 2017-07-26 DIAGNOSIS — Z952 Presence of prosthetic heart valve: Secondary | ICD-10-CM

## 2017-07-26 DIAGNOSIS — I5040 Unspecified combined systolic (congestive) and diastolic (congestive) heart failure: Secondary | ICD-10-CM | POA: Diagnosis not present

## 2017-07-26 DIAGNOSIS — I2510 Atherosclerotic heart disease of native coronary artery without angina pectoris: Secondary | ICD-10-CM | POA: Diagnosis not present

## 2017-07-26 DIAGNOSIS — R2681 Unsteadiness on feet: Secondary | ICD-10-CM | POA: Diagnosis not present

## 2017-07-26 DIAGNOSIS — I5020 Unspecified systolic (congestive) heart failure: Secondary | ICD-10-CM | POA: Diagnosis not present

## 2017-07-26 DIAGNOSIS — J449 Chronic obstructive pulmonary disease, unspecified: Secondary | ICD-10-CM | POA: Diagnosis not present

## 2017-07-26 DIAGNOSIS — J918 Pleural effusion in other conditions classified elsewhere: Secondary | ICD-10-CM | POA: Diagnosis not present

## 2017-07-26 DIAGNOSIS — D6489 Other specified anemias: Secondary | ICD-10-CM | POA: Diagnosis not present

## 2017-07-26 DIAGNOSIS — J90 Pleural effusion, not elsewhere classified: Secondary | ICD-10-CM | POA: Diagnosis not present

## 2017-07-26 DIAGNOSIS — Z741 Need for assistance with personal care: Secondary | ICD-10-CM | POA: Diagnosis not present

## 2017-07-26 DIAGNOSIS — R0789 Other chest pain: Secondary | ICD-10-CM | POA: Diagnosis not present

## 2017-07-26 DIAGNOSIS — I255 Ischemic cardiomyopathy: Secondary | ICD-10-CM | POA: Diagnosis not present

## 2017-07-26 DIAGNOSIS — I509 Heart failure, unspecified: Secondary | ICD-10-CM | POA: Diagnosis not present

## 2017-07-26 DIAGNOSIS — R41841 Cognitive communication deficit: Secondary | ICD-10-CM | POA: Diagnosis not present

## 2017-07-26 DIAGNOSIS — I10 Essential (primary) hypertension: Secondary | ICD-10-CM | POA: Diagnosis not present

## 2017-07-26 DIAGNOSIS — Z4682 Encounter for fitting and adjustment of non-vascular catheter: Secondary | ICD-10-CM | POA: Diagnosis not present

## 2017-07-26 DIAGNOSIS — I5022 Chronic systolic (congestive) heart failure: Secondary | ICD-10-CM | POA: Diagnosis not present

## 2017-07-26 DIAGNOSIS — I5023 Acute on chronic systolic (congestive) heart failure: Secondary | ICD-10-CM | POA: Diagnosis not present

## 2017-07-26 LAB — TYPE AND SCREEN
ABO/RH(D): A POS
ANTIBODY SCREEN: NEGATIVE
UNIT DIVISION: 0
Unit division: 0

## 2017-07-26 LAB — BPAM RBC
BLOOD PRODUCT EXPIRATION DATE: 201810092359
Blood Product Expiration Date: 201810092359
ISSUE DATE / TIME: 201809250814
ISSUE DATE / TIME: 201809250814
UNIT TYPE AND RH: 6200
Unit Type and Rh: 6200

## 2017-07-26 MED ORDER — POTASSIUM CHLORIDE CRYS ER 20 MEQ PO TBCR: 20.0000 meq | EXTENDED_RELEASE_TABLET | Freq: Every day | ORAL | 6 refills | Status: DC

## 2017-07-26 MED ORDER — CARVEDILOL 3.125 MG PO TABS
3.1250 mg | ORAL_TABLET | Freq: Two times a day (BID) | ORAL | Status: DC
  Administered 2017-07-26 (×2): 3.125 mg via ORAL
  Filled 2017-07-26: qty 1

## 2017-07-26 MED ORDER — CLOPIDOGREL BISULFATE 75 MG PO TABS: 75.0000 mg | ORAL_TABLET | Freq: Every day | ORAL | 3 refills | Status: DC

## 2017-07-26 MED ORDER — ATORVASTATIN CALCIUM 80 MG PO TABS: 80.0000 mg | ORAL_TABLET | Freq: Every day | ORAL | 3 refills | Status: DC

## 2017-07-26 MED ORDER — CARVEDILOL 3.125 MG PO TABS: 3.1250 mg | ORAL_TABLET | Freq: Two times a day (BID) | ORAL | 6 refills | Status: DC

## 2017-07-26 NOTE — Progress Notes (Addendum)
Edward Strickland  Patient Name: Edward Strickland Date of Encounter: 07/26/2017  Primary Cardiologist: Dr. Burt Knack- he may be able to establish in [facility] eventually  Hospital Problem List     Principal Problem:   S/P TAVR (transcatheter aortic valve replacement) Active Problems:   Acute on chronic systolic CHF (congestive heart failure) (HCC)   Aortic stenosis   CKD (chronic kidney disease), stage III   Essential hypertension   COPD (chronic obstructive pulmonary disease) (HCC)   Pleural effusion   Coronary artery disease involving native coronary artery of native heart with unstable angina pectoris (HCC)   Severe calcific aortic stenosis     Subjective   Feeling well, ready to go to SNF.  Inpatient Medications    Scheduled Meds: . aspirin EC  81 mg Oral Daily  . atorvastatin  80 mg Oral q1800  . bacitracin  1 application Topical L7L  . Chlorhexidine Gluconate Cloth  6 each Topical Daily  . clopidogrel  75 mg Oral Daily  . feeding supplement (ENSURE ENLIVE)  237 mL Oral BID BM  . furosemide  40 mg Oral Daily  . pantoprazole  40 mg Oral Daily  . potassium chloride  20 mEq Oral Daily  . sodium chloride flush  3 mL Intravenous Q12H   Continuous Infusions: . sodium chloride 250 mL (07/24/17 2123)  . sodium chloride    . lactated ringers    . lactated ringers 10 mL/hr at 07/15/17 1451   PRN Meds: sodium chloride, sodium chloride, aminocaproic acid, lactated ringers, lidocaine (PF), magnesium hydroxide, metoprolol tartrate, sodium chloride flush, traMADol   Vital Signs    Vitals:   07/25/17 1558 07/25/17 1959 07/26/17 0021 07/26/17 0504  BP: (!) 129/58 (!) 127/50 (!) 119/55 120/67  Pulse: 76 73 70 73  Resp: 18 19 13 18   Temp: 97.8 F (36.6 C) 97.6 F (36.4 C) 97.8 F (36.6 C) 98 F (36.7 C)  TempSrc: Oral Oral Oral Oral  SpO2: 100% 98% 99% 99%  Weight: 141 lb 3.4 oz (64.1 kg)   140 lb 6.9 oz (63.7 kg)  Height: 5'  7" (1.702 m)       Intake/Output Summary (Last 24 hours) at 07/26/17 0832 Last data filed at 07/26/17 0543  Gross per 24 hour  Intake              840 ml  Output             2950 ml  Net            -2110 ml   Filed Weights   07/25/17 0455 07/25/17 1558 07/26/17 0504  Weight: 143 lb 15.4 oz (65.3 kg) 141 lb 3.4 oz (64.1 kg) 140 lb 6.9 oz (63.7 kg)    Physical Exam   GEN: Well nourished, well developed, in no acute distress. Frail appearing HEENT: Grossly normal.  Neck: Supple, no JVD, carotid bruits, or masses. Cardiac: RRR, no murmurs, rubs, or gallops. No clubbing, cyanosis, edema.  Radials/DP/PT 2+ and equal bilaterally.  Respiratory:  Respirations regular and unlabored, clear to auscultation bilaterally. GI: Soft, nontender, nondistended, BS + x 4. MS: no deformity or atrophy. Skin: warm and dry, no rash. Groin sites stable. Pleur-X tube in place in right chest.  Neuro:  Strength and sensation are intact. Psych: AAOx3.  Normal affect.  Labs    CBC  Recent Labs  07/23/17 1030 07/23/17 1050 07/24/17 0338  WBC 9.7  --  11.2*  HGB 9.5* 9.5* 10.0*  HCT 28.8* 28.0* 30.3*  MCV 88.1  --  88.1  PLT 188  --  097   Basic Metabolic Panel  Recent Labs  07/24/17 0338 07/25/17 0745  NA 135 136  K 4.0 4.1  CL 103 103  CO2 25 25  GLUCOSE 109* 77  BUN 15 14  CREATININE 0.89 0.90  CALCIUM 8.3* 8.0*  MG 1.7 2.0   Liver Function Tests No results for input(s): AST, ALT, ALKPHOS, BILITOT, PROT, ALBUMIN in the last 72 hours. No results for input(s): LIPASE, AMYLASE in the last 72 hours. Cardiac Enzymes No results for input(s): CKTOTAL, CKMB, CKMBINDEX, TROPONINI in the last 72 hours. BNP Invalid input(s): POCBNP D-Dimer No results for input(s): DDIMER in the last 72 hours. Hemoglobin A1C No results for input(s): HGBA1C in the last 72 hours. Fasting Lipid Panel No results for input(s): CHOL, HDL, LDLCALC, TRIG, CHOLHDL, LDLDIRECT in the last 72 hours. Thyroid  Function Tests No results for input(s): TSH, T4TOTAL, T3FREE, THYROIDAB in the last 72 hours.  Invalid input(s): FREET3  Telemetry    NSR with one missed beat - Personally Reviewed  ECG    sinus with 1st deg AV block, LVH with repol abnormality  - Personally Reviewed  Radiology    Dg Chest 2 View  Result Date: 07/25/2017 CLINICAL DATA:  Pleural effusion . EXAM: CHEST  2 VIEW COMPARISON:  07/24/2017. FINDINGS: Right IJ line stable position. Right PleurX catheter in stable position. Prior cardiac valve repair. Cardiomegaly with interim improvement pulmonary venous congestion. Interim improvement of aeration both lungs . Small bilateral pleural effusions. No pneumothorax . IMPRESSION: 1. Right IJ line and right PleurX catheter in stable position. No pneumothorax. Tiny pleural effusions are present and are stable. 2.  Improvement of pulmonary venous and interstitial congestion. Electronically Signed   By: Marcello Moores  Register   On: 07/25/2017 14:38    Cardiac Studies   2D ECHO: 07/09/2017 LV EF: 25% - 30% Study Conclusion - Left ventricle: The cavity size was mildly dilated. Wall thickness was increased in a pattern of mild LVH. Systolic function was severely reduced. The estimated ejection fraction was in the range of 25% to 30%. Diffuse hypokinesis. - Aortic valve: Valve mobility was restricted. There was severe stenosis. There was mild to moderate regurgitation. Valve area (VTI): 0.41 cm^2. Valve area (Vmax): 0.35 cm^2. Valve area (Vmean): 0.39 cm^2. - Left atrium: The atrium was moderately dilated. - Right ventricle: The cavity size was moderately dilated. Systolic function was moderately reduced. - Right atrium: The atrium was moderately dilated. - Pulmonary arteries: Systolic pressure was severely increased. PA peak pressure: 79 mm Hg (S). - Pericardium, extracardiac: There was a left pleural effusion. Impressions: - Severe global reduction in LV systolic  function; mild LVE and LVH; calcified aortic valve with severe AS and mild to moderate AI; moderate LAE; moderate RVE with moderately reduced function; mild TR with severely elevated pulmonary pressure.   07/10/17 RIGHT HEART CATH AND CORONARY ANGIOGRAPHY  Conclusion  1. Severe single-vessel coronary artery disease with severe stenosis in the mid LAD and otherwise mild diffuse nonobstructive calcific coronary disease 2. Known severe aortic stenosis with very heavy calcification of the aortic valve on plain fluoroscopy and severely restricted aortic leaflet mobility 3. Severe pulmonary hypertension likely secondary to left heart disease with severely elevated pulmonary capillary wedge pressure  Recommendation: Continued evaluation by the multidisciplinary heart valve Strickland. Likely proceed with PCI of the LAD followed by TAVR pending cardiac surgical  consultation    Cardiac CT 07/12/17 IMPRESSION: 1. Trileaflet aortic valve with heavily calcified leaflets and severe leaflet opening restriction. There are significant asymmetric calcifications extending into the LVOT under the non-coronary leaflet. Annular measurements suitable for delivery of a 29 mm Edwards-SAPIEN valve. 2. Sufficient annulus to coronary distance. 3. Optimum Fluoroscopic Angle for Delivery: LAU 7 CAU 7. 4. No Thrombus in the left atrial appendage. 5. Dilated pulmonary artery measuring 33 x 32 mm suggestive of pulmonary hypertension.   Carotid doppler 07/14/17 Summary: Bilateral: mild soft plaque CCA. Mild mixed plaque origin and proximal ICA. 1-39% ICA plaquing. vertebral artery flow is antegrade.   CT angio abdomen/pelvis/chest: 07/12/17 IMPRESSION: 1. Vascular findings and measurements pertinent to potential TAVR procedure, as detailed above. This patient does have suitable pelvic arterial access bilaterally. 2. Severe thickening calcification of the aortic valve, compatible with the reported  clinical history of severe aortic stenosis. 3. Findings in the thorax suggestive of underlying congestive heart failure, including moderate pulmonary edema, moderate right and small a moderate left pleural effusions, and cardiomegaly. 4. Aortic atherosclerosis, in addition to left anterior descending coronary artery disease. Assessment for potential risk factor modification, dietary therapy or pharmacologic therapy may be warranted, if clinically indicated. 5. Colonic diverticulosis without evidence to suggest an acute diverticulitis at this time. 6. Additional incidental findings, as above. Aortic Atherosclerosis (ICD10-I70.0).   07/14/17 CORONARY STENT INTERVENTION  Conclusion  1. Severe stenosis mid LAD 2. Successful PTCA/DES x 1 mid LAD  Recommendations: Will continue DAPT with ASA and Plavix for at least 3 months but longer if he tolerates. Continue planning for TAVR.    Post operative echo 07/24/17 - Left ventricle: Mid an basal inferior wall akinesis The cavity size was mildly dilated. There was mild concentric hypertrophy. Systolic function was moderately reduced. The estimated ejection fraction was in the range of 35% to 40%. Doppler parameters are consistent with abnormal left ventricular relaxation (grade 1 diastolic dysfunction). - Aortic valve: 29 mm Sapien 3 valve with moderate peri valvular regurgitation. Valve area (VTI): 2.02 cm^2. Valve area (Vmax): 1.64 cm^2. Valve area (Vmean): 1.96 cm^2. - Left atrium: The atrium was mildly dilated. - Atrial septum: No defect or patent foramen ovale was identified. - Pulmonary arteries: PA peak pressure: 41 mm Hg (S).    Patient Profile     Edward Strickland a 76 y.o.malewith a hx of HTN, aortic stenosis, and COPD who was transferred to [facility] from [place] on 07/09/17 for further work up of his severe AS and new systolic CHF.   Assessment & Plan    Severe symptomatic stage D AS: s/p successful TAVR with  76mm Edwards Sapien valve via R TF approach on 9/25. Groin sites stable. ECG with sinus and 1st degree AV block. Post operative shows moderate PVL and EF 35-40%. Continue ASA/plavix  New systolic CHF: EF 23-30% by echo on admission. Repeat echo s/p TAVR shows improvement of EF to 35-40%. Now euvolemic. He has been stable on lasix 40mg  daily. He was previously on lisinopril and Lopressor, but these have been discontinued given soft BPs. Last few readings have been better. Will add low dose Coreg 3.125mg  BID and follow as an outpatient at [facility] visit.  CAD: cath 9/12 showed 90% mLAD stenosis. He underwent PCI/DES of the LAD on 9/19. Continue ASA/plavix and atorvastatin 80mg  daily. BB discontinued 2/2 soft BPs. Will add back Coreg as above.  Right pleural effusion: s/pIR thoracentesis 9/13 which drained 2 L from the right pleural space. Another 1.5  L drained in OR 9/25 and Pleur-X tube placed. We will make sure he has canisters at discharge and will start draining it today and then QOD.  AKI: creat now stablazied.   COPD: PFTS showed severe obstruction   HTN: BP improved today. Continue to monitor   Dental: s/p multiple dental extractions 9/17 by Dr. Enrique Sack. Has had some oozing that is now better controlled.  Dispo: he does not have a lot of social support and will need short term placement in a SNF at discharge. HHRN will be arranged for pleur-X draining.  Plan to go to SNF today if we have bed placement.   Signed, Angelena Form, PA-C  07/26/2017, 8:32 AM  Pager [phone]  I have seen and examined the patient and agree with the assessment and plan as outlined.  Rexene Alberts, MD 07/26/2017 5:06 PM

## 2017-07-26 NOTE — Progress Notes (Signed)
Report called to [facility]. No further questions at this time.

## 2017-07-26 NOTE — Care Management Note (Signed)
Case Management Note Previous CM note initiated by Zenon Mayo, RN--07/24/2017, 3:53 PM   Patient Details  Name: Edward Strickland MRN: [id number] Date of Birth: [birth year]  Subjective/Objective:  From home alone, s/p coronary stent intervention, will be on plavix, per pt eval rec HHPT,  NCM spoke with patient and offered choice, he stated "No, No, I do not want HH, I have a friend who is with me and will help me, and he then preceded to hand the NCM the phone, there was a lady on the phone who gave me [name] 's phone number cell [phone] and home is [phone].  She states she helps patient out a lot because he took care of her mother.   9/20 [place], BSN - he is s/p coronary stent intervention, he has severe AS and new syst CHF,planning for Valve surgery on 9/25.  PT will need to reassess him after surgery.    9/26 [place], BSN - SP TAVR  , will need Pleurx drainage catheter, Plan is for SNF, CSW following for placement.  Patient will need to have drains to go with him to SNF and SNF will have to be able to have capability to do drains.  Forms will need to be faxed and completed when drainage starts which should be on 9/27 and when we find out what SNF patient is going to.  PT to  Re evaluate patient today.  Patient will be going to [facility].                             Action/Plan: NCM will follow for dc needs.   Expected Discharge Date:  07/26/17               Expected Discharge Plan:  Skilled Nursing Facility  In-House Referral:  Clinical Social Work  Discharge planning Services  CM Consult  Post Acute Care Choice:  Durable Medical Equipment Choice offered to:  Patient  DME Arranged:  Other see comment DME Agency:  Other - Comment  HH Arranged:  Patient Refused [place] Agency:     Status of Service:  Completed, signed off  If discussed at [place] of Stay Meetings, dates discussed:    Discharge Disposition:  skilled facility   Additional Comments:  07/26/17- 25- Crystalyn Delia RN, CM- pt for d/c to SNF today- CSW following for placement needs- to [facility] [place]- family to provide transport and to take pleurX cath drainage kit box with them-  PleurX forms completed and have been faxed to [place] to be mailed.   Dahlia Client [place], RN 07/26/2017, 11:13 AM [phone]

## 2017-07-26 NOTE — Care Management Important Message (Signed)
Important Message  Patient Details  Name: Edward Strickland MRN: [id number] Date of Birth: 10/03/1941   Medicare Important Message Given:  Yes    Nathen May 07/26/2017, 3:29 PM

## 2017-07-26 NOTE — Discharge Instructions (Signed)
Daily Right Pleurix drainage to start 07/26/2017: If <150 ml /drainage session x3 consecutive occasions - QOD drainage If <150 ml /drainage session x3 consecutive occasions on  QOD drainage schedule- call for office evaluation and possible removal    SINUS PRECAUTIONS AFTER ORAL SURGERY  AVOID   Blowing your nose  It is best to wipe away nasal secretions carefully. After 2 weeks, if you must blow  your nose, blow gently through both sides at the same time. Do not pinch your  nose; do not blow just one side at a time.   Sneezing  If you must sneeze, keep your mouth open and do not pinch your nose closed.   Sucking  Do not drink through a straw. Do not smoke.    Blowing  Do not play a wind instrument. Do not blow a balloons.   Pushing or lifting  Do not lift or push objects weighing more than 20 pounds.   Bending over  Keeping her head above the level of your heart. Sleep with your head slightly raised.   Notify your dentist if you bleed from your nose. If you see bleeding from your nose, have neck stiffness, or increased sensitivity to bright light or severe headache, call Dental Medicine or proceed to the Emergency Department for evaluation immediately.  Notify your dentist if you are unable to take any of your medications as prescribed. You may be advised to take an antibiotic and decongestant as well as your regular pain medication. You must take these medications as prescribed. Do not stop taking them on her own. If you have a problem with any medication, please call us so that we can make an adjustment for you.  Contacts: 1. Dental Medicine: [phone] 4. [facility]: [phone]    MOUTH CARE AFTER SURGERY  FACTS:  Ice used in ice bag helps keep the swelling down, and can help lessen the pain.  It is easier to treat pain BEFORE it happens.  Spitting disturbs the clot and may cause bleeding to start again, or to get worse.  Smoking  delays healing and can cause complications.  Sharing prescriptions can be dangerous.  Do not take medications not recently prescribed for you.  Antibiotics may stop birth control pills from working.  Use other means of birth control while on antibiotics.  Warm salt water rinses after the first 24 hours will help lessen the swelling:  Use 1/2 teaspoonful of table salt per oz.of water.  DO NOT:  Do not spit.  Do not drink through a straw.  Strongly advised not to smoke, dip snuff or chew tobacco at least for 3 days.  Do not eat sharp or crunchy foods.  Avoid the area of surgery when chewing.  Do not stop your antibiotics before your instructions say to do so.  Do not eat hot foods until bleeding has stopped.  If you need to, let your food cool down to room temperature.  EXPECT:  Some swelling, especially first 2-3 days.  Soreness or discomfort in varying degrees.  Follow your dentist's instructions about how to handle pain before it starts.  Pinkish saliva or light blood in saliva, or on your pillow in the morning.  This can last around 24 hours.  Bruising inside or outside the mouth.  This may not show up until 2-3 days after surgery.  Don't worry, it will go away in time.  Pieces of "bone" may work themselves loose.  It's OK.  If they bother  you, let us know.  WHAT TO DO IMMEDIATELY AFTER SURGERY:  Bite on the gauze with steady pressure for 1-2 hours.  Don't chew on the gauze.  Do not lie down flat.  Raise your head support especially for the first 24 hours.  Apply ice to your face on the side of the surgery.  You may apply it 20 minutes on and a few minutes off.  Ice for 8-12 hours.  You may use ice up to 24 hours.  Before the numbness wears off, take a pain pill as instructed.  Prescription pain medication is not always required.  SWELLING:  Expect swelling for the first couple of days.  It should get better after that.  If swelling increases 3 days or so after  surgery; let us know as soon as possible.  FEVER:  Take Tylenol every 4 hours if needed to lower your temperature, especially if it is at 100F or higher.  Drink lots of fluids.  If the fever does not go away, let us know.  BREATHING TROUBLE:  Any unusual difficulty breathing means you have to have someone bring you to the emergency room ASAP  BLEEDING:  Light oozing is expected for 24 hours or so.  Prop head up with pillows  Avoid spitting  Do not confuse bright red fresh flowing blood with lots of saliva colored with a little bit of blood.  If you notice some bleeding, place gauze or a tea bag where it is bleeding and apply CONSTANT pressure by biting down for 1 hour.  Avoid talking during this time.  Do not remove the gauze or tea bag during this hour to "check" the bleeding.  If you notice bright RED bleeding FLOWING out of particular area, and filling the floor of your mouth, put a wad of gauze on that area, bite down firmly and constantly.  Call us immediately.  If we're closed, have someone bring you to the emergency room.  ORAL HYGIENE:  Brush your teeth as usual after meals and before bedtime.  Use a soft toothbrush around the area of surgery.  DO NOT AVOID BRUSHING.  Otherwise bacteria(germs) will grow and may delay healing or encourage infection.  Since you cannot spit, just gently rinse and let the water flow out of your mouth.  DO NOT SWISH HARD.  EATING:  Cool liquids are a good point to start.  Increase to soft foods as tolerated.  PRESCRIPTIONS:  Follow the directions for your prescriptions exactly as written.  If Dr. Enrique Sack gave you a narcotic pain medication, do not drive, operate machinery or drink alcohol when on that medication.  QUESTIONS:  Call our office during office hours [phone] or call the Emergency Room at [phone].    ACTIVITY AND EXERCISE  Daily activity and exercise are an important part of your recovery. People  recover at different rates depending on their general health and type of valve procedure.  Most people recovering from TAVR feel better relatively quickly   No lifting, pushing, pulling more than 10 pounds (examples to avoid: groceries, vacuuming, gardening, golfing):             - For one week with a procedure through the groin.             - For six weeks for procedures through the chest wall.             - For three months for procedures through the breast-bone. NOTE: You will typically see one  of our providers 7-10 days after your procedure to discuss [place] the above activities.    DRIVING  Do not drive for until you are seen for follow up and cleared by a provider.  If you have been told by your doctor in the past that you may not drive, you must talk with him/her before you begin driving again.   DRESSING  Groin site: you may leave the clear dressing over the site for up to one week or until it falls off.   HYGIENE  If you had a femoral (leg) procedure, you may take a shower when you return home. After the shower, pat the site dry. Do NOT use powder, oils or lotions in your groin area until the site has completely healed.  If you had a chest procedure, you may shower when you return home unless specifically instructed not to by your discharging practitioner.             - DO NOT scrub incision; pat dry with a towel             - DO NOT apply any lotions, oils, powders to the incision             - No tub baths / swimming for at least 2 weeks.  If you notice any fevers, chills, increased pain, swelling, bleeding or pus, please contact your doctor.  ADDITIONAL INFORMATION  If you are going to have an upcoming dental procedure, please contact our office as you will require antibiotics ahead of time to prevent infection on your heart valve.

## 2017-07-26 NOTE — Progress Notes (Signed)
Clinical Social Worker facilitated patient discharge including contacting patient family and facility to confirm patient discharge plans.  Clinical information faxed to facility and family agreeable with plan.  CSW arranged ambulance transport via [facility] to [place] for 3:30 pick up at [facility] .  RN to call [phone] (pt will go in rm [phone]) for report prior to discharge.  Clinical Social Worker will sign off for now as social work intervention is no longer needed. Please consult [place] again if new need arises.  Rhea Pink, MSW, [place]

## 2017-07-26 NOTE — Progress Notes (Signed)
CARDIAC REHAB PHASE I   PRE:  Rate/Rhythm: 77 SR    BP: sitting 136/74    SaO2: 96 RA  MODE:  Ambulation: 430 ft   POST:  Rate/Rhythm: 118 ST    BP: sitting 148/84     SaO2: 98 RA  Pt eager to walk. Stood and used his cane in room but stated he was faster with the RW. Changed to RW for hall. Steady, no c/o. Thankful to walk. Discussed Plavix, low sodium, walking daily, IS, CRPII, and restrictions. Pt not very receptive, he seems to have his ways of doing things.  Not interested in CRPII. He does not have a phone for them to call. Otherwise doing well, very talkative. [place], ACSM 07/26/2017 11:52 AM

## 2017-07-26 NOTE — Discharge Summary (Signed)
[place] VALVE TEAM   Discharge Summary    Patient ID: Edward Strickland,  MRN: [id number], DOB/AGE: Mar 03, 1941 76 y.o.  Admit date: 07/09/2017 Discharge date: 07/26/2017  Primary Care Provider: Jaclynn Strickland Primary Cardiologist: Dr. Burt Strickland- he may be able to establish in [place] eventually   Discharge Diagnoses    Principal Problem:   S/P TAVR (transcatheter aortic valve replacement) Active Problems:   Acute on chronic systolic CHF (congestive heart failure) ([place])   Aortic stenosis   CKD (chronic kidney disease), stage III   Essential hypertension   COPD (chronic obstructive pulmonary disease) (HCC)   Pleural effusion   Coronary artery disease involving native coronary artery of native heart with unstable angina pectoris (HCC)   Allergies No Known Allergies   History of Present Illness     Edward Cooperis a 76 y.o.malewith a hx of HTN, aortic stenosis, and COPD who was transferred to [facility] from [place] on 07/09/17 for further work up of his severe AS and new systolic CHF.   Per review of Edward Strickland notes, the patient was originally diagnosed with severe aortic stenosis back in 2016, but LV function was normal. Review of previous echo report from 09/2015 EF 60-65%, showed trileaflet aortic valve with AVA 0.44 cm^2, mean gradient 66 mm Hg and peak gradient 89. He follows with Edward Major NP in [place]. He did not follow with a cardiologist.  He lives alone in a house in [place]. He has a friend named Edward Strickland who lives close by and checks in on him. He is divorced and has no kids. He has no other living family. He is now retired but worked for [employer] for over 25 years. He previously smoked but quit remotely. He walks with a cane and drives his car to get his meals at a [employer].  Edward Strickland was in his usual state of health until 07/07/17 when he presented to [facility] ED complaining of worsening  lower extremity edema and dyspnea on exertion  a few weeks. In the ED he was hypoxic O2 sat 86% on room air, lower extremity dopplers negative for DVT, chest x-ray with evidence of CHF with bilateral pleural effusions R>L, cardiomegaly and atelectasis vs pneumonia in right middle lobe/lung base. ProBNP 28,800. Troponin 0.07--> 0.08, but there is report that his troponin is chronically mildly elevated. ECG with sinus rhythm and nonspecific ST/TW changes. WBC 9.6, H/H 12.6/38.8, PLT 193, sodium 139, K 4.4, BUN 38, creatinine 1.3, GFR 54, INR 1.2. He was started on IV Lasix and admitted to the hospital. 2D echo was performed which showed mild LVH with severe global hypokinesis, EF 20-25%, moderately enlarged RV with moderate systolic RV dysfunction, severe aortic stenosis with a mean gradient of 51 mmHg and a peak gradient of 89 mmHg, AVA 0.41 cm, mild biatrial enlargement, mild MR, moderate to severe TR, moderate pulmonary hypertension with PA 52 mmHg.  Given findings of severe aortic stenosis with acute CHF with new LV dysfunction he was transferred to [facility] for further workup and evaluation by the multidisciplinary valve team.    Hospital Course     Consultants: none  Severe symptomatic stage D AS: repeat echo here confirmed severe AS and systolic heart failure. He was kept in house for TAVR work up given poor social situation and lack of support. He is now s/p successful TAVR with 61mm Edwards Sapien valve via R TF approach on 9/25. Groin sites stable. ECG with  sinus and 1st degree AV block. Post operative shows moderate PVL and EF 35-40%. Continue ASA/plavix  New systolic CHF:EF 93-71% by echo on admission. He was diuresed with IV lasix. Repeat echo s/p TAVR shows improvement of EF to 35-40%. Now euvolemic. He has been stable on lasix 40mg  daily. Discharge weight 140 lbs. He was previously on lisinopril and Lopressor, but these had been discontinued given soft BPs. BP improved at time  of discharge and Coreg 3.125mg  BID was added. We will follow him closely as an outpatient and see back at 1 week TOC visit.  CAD: cath 9/12 showed 90% mLAD stenosis. He underwent PCI/DES of the LAD on 9/19. Continue ASA/plavix for at least 1 year, Coreg 3.125mg  BID and atorvastatin 80mg  daily.  Right pleural effusion: s/pIR thoracentesis 9/13 which drained 2 L from the right pleural space. Another 1.5 L drained in OR 9/25and Pleur-X tube placed. Instructions for draining QOD have been sent to his facility. It will need to be drained tomorrow (07/27/17) and then every other day. Canisters will also be sent by SW.  AKI: creat now stablazied.   COPD: PFTS showed severe obstruction   HTN: BP improved today. Continue to monitor   Dental: s/p multiple dental extractions 9/17 by Dr. Enrique Strickland. Has had some post operative oozing that is now better controlled after stitching in the OR.   Dispo: he does not have a lot of social support and no living family. There have been some questions about him being homeless. He has a friend named Edward Strickland who helps take care of him. She has expressed that she cannot help him 24 hours a day. He has also had a prolonged hospital stay and is physically deconditioned. PT has recommended short term placement in a SNF at discharge. He expressed interest in [facility] as his mother stayed there in the past.    The patient has had an uncomplicated hospital course and is recovering well. The femoral catheter sites are stable. He has been seen by Dr. Roxy Strickland today and deemed ready for discharge home. All follow-up appointments have been scheduled. Discharge medications are listed below.  _____________  Discharge Vitals Blood pressure 120/67, pulse 73, temperature 98 F (36.7 C), temperature source Oral, resp. rate 18, height 5\' 7"  (1.702 m), weight 140 lb 6.9 oz (63.7 kg), SpO2 99 %.  Filed Weights   07/25/17 0455 07/25/17 1558 07/26/17 0504  Weight: 143 lb 15.4 oz (65.3  kg) 141 lb 3.4 oz (64.1 kg) 140 lb 6.9 oz (63.7 kg)    Labs & Radiologic Studies     CBC  Recent Labs  07/23/17 1030 07/23/17 1050 07/24/17 0338  WBC 9.7  --  11.2*  HGB 9.5* 9.5* 10.0*  HCT 28.8* 28.0* 30.3*  MCV 88.1  --  88.1  PLT 188  --  696   Basic Metabolic Panel  Recent Labs  07/24/17 0338 07/25/17 0745  NA 135 136  K 4.0 4.1  CL 103 103  CO2 25 25  GLUCOSE 109* 77  BUN 15 14  CREATININE 0.89 0.90  CALCIUM 8.3* 8.0*  MG 1.7 2.0   Liver Function Tests No results for input(s): AST, ALT, ALKPHOS, BILITOT, PROT, ALBUMIN in the last 72 hours. No results for input(s): LIPASE, AMYLASE in the last 72 hours. Cardiac Enzymes No results for input(s): CKTOTAL, CKMB, CKMBINDEX, TROPONINI in the last 72 hours. BNP Invalid input(s): POCBNP D-Dimer No results for input(s): DDIMER in the last 72 hours. Hemoglobin A1C No results for  input(s): HGBA1C in the last 72 hours. Fasting Lipid Panel No results for input(s): CHOL, HDL, LDLCALC, TRIG, CHOLHDL, LDLDIRECT in the last 72 hours. Thyroid Function Tests No results for input(s): TSH, T4TOTAL, T3FREE, THYROIDAB in the last 72 hours.  Invalid input(s): FREET3  Dg Orthopantogram  Result Date: 07/10/2017 CLINICAL DATA:  Aortic stenosis. EXAM: ORTHOPANTOGRAM/PANORAMIC COMPARISON:  None. FINDINGS: Mandible is intact. No fracture deformity. Multiple absent teeth. Approximate tooth 28 periapical lucency and dental carie. Tooth 27 fragment. IMPRESSION: No fracture deformity. Multiple absent teeth. Approximate tooth 27 periapical lucency/ abscess and dental carie. Electronically Signed   By: Elon Alas M.D.   On: 07/10/2017 20:31   Dg Chest 1 View  Result Date: 07/11/2017 CLINICAL DATA:  76 year old male with a history of right pleural effusion EXAM: CHEST 1 VIEW COMPARISON:  07/09/2017 FINDINGS: Cardiomediastinal silhouette unchanged with cardiomegaly. Calcifications of the aortic arch. Status post thoracentesis there is  improved right-sided opacity, with persisting blunting of the right costophrenic angle and partial obscuration the right hemidiaphragm. Partial obscuration of the left hemidiaphragm persists with blunting of the left costophrenic angle. Ill-defined airspace disease in the right mid lung. No pneumothorax. Coarsened interstitial markings. IMPRESSION: Reduced volume of right-sided pleural effusion status post thoracentesis with no pneumothorax. Trace left-sided pleural effusion persists with associated consolidation/atelectasis. Cardiomegaly.  Background of chronic lung changes. Electronically Signed   By: Corrie Mckusick D.O.   On: 07/11/2017 14:23   Dg Chest 2 View  Result Date: 07/25/2017 CLINICAL DATA:  Pleural effusion . EXAM: CHEST  2 VIEW COMPARISON:  07/24/2017. FINDINGS: Right IJ line stable position. Right PleurX catheter in stable position. Prior cardiac valve repair. Cardiomegaly with interim improvement pulmonary venous congestion. Interim improvement of aeration both lungs . Small bilateral pleural effusions. No pneumothorax . IMPRESSION: 1. Right IJ line and right PleurX catheter in stable position. No pneumothorax. Tiny pleural effusions are present and are stable. 2.  Improvement of pulmonary venous and interstitial congestion. Electronically Signed   By: Marcello Moores  Register   On: 07/25/2017 14:38   Dg Chest 2 View  Result Date: 07/22/2017 CLINICAL DATA:  History of CHF.  No current complaints EXAM: CHEST  2 VIEW COMPARISON:  Chest x-ray of July 11, 2017 FINDINGS: The lungs are well-expanded. There is a moderate-sized right-sided pleural effusion and small left pleural effusion. The cardiac silhouette is mildly enlarged. The central pulmonary vascularity is prominent but there is no cephalization. There is calcification in the wall of the aortic arch. The mediastinum is normal in width. The bony thorax exhibits no acute abnormality. IMPRESSION: There is has been interval increase in the size of  the right pleural effusion. There is a stable small left pleural effusion. Stable cardiomegaly without significant pulmonary vascular congestion. Thoracic aortic atherosclerosis. Electronically Signed   By: David  Martinique M.D.   On: 07/22/2017 07:20   Ct Coronary Morph W/cta Cor W/score W/ca W/cm &/or Wo/cm  Addendum Date: 07/16/2017   ADDENDUM REPORT: 07/16/2017 06:43 EXAM: OVER-READ INTERPRETATION  CT CHEST The following report is an over-read performed by radiologist Dr. Rebekah [facility] on 07/16/2017. This over-read does not include interpretation of cardiac or coronary anatomy or pathology. The cardiac CTA interpretation by the cardiologist is attached. COMPARISON:  None. FINDINGS: Extracardiac findings will be described separately under dictation for contemporaneously obtained CTA of the chest, abdomen and pelvis dated 07/12/2017. IMPRESSION: Please see separate dictation for contemporaneously obtained CTA of the chest, abdomen and pelvis dated 07/12/2017 for full description of  relevant extracardiac findings. Electronically Signed   By: Vinnie Langton M.D.   On: 07/16/2017 06:43   Result Date: 07/16/2017 CLINICAL DATA:  76 year old male with severe aortic stenosis being evaluated for a TAVR procedure. EXAM: Cardiac TAVR CT TECHNIQUE: The patient was scanned on a [employer]. A 120 kV retrospective scan was triggered in the descending thoracic aorta at 111 HU's. Gantry rotation speed was 250 msecs and collimation was .6 mm. No beta blockade or nitro were given. The 3D data set was reconstructed in 5% intervals of the R-R cycle. Systolic and diastolic phases were analyzed on a dedicated work station using MPR, MIP and VRT modes. The patient received 80 cc of contrast. FINDINGS: Aortic Valve: Trileaflet aortic valve with heavily calcified leaflets and severe leaflet opening restriction. There are significant asymmetric calcifications extending into the LVOT under the  non-coronary leaflet. Aorta:  Normal size, mild diffuse calcifications, no dissection. Sinotubular Junction:  31 x 28 mm Ascending Thoracic Aorta:  36 x 35 mm Aortic Arch:  24 x 24 mm Descending Thoracic Aorta:  24 x 24 mm Sinus of Valsalva Measurements: Non-coronary:  35 mm Right -coronary:  33 mm Left -coronary:  34 mm Coronary Artery Height above Annulus: Left Main:  17 mm Right Coronary:  18 mm Virtual Basal Annulus Measurements: Maximum/Minimum Diameter:  31.6 x 23.4 mm Perimeter:  89.2 mm Area:  601 mm2 Coronary Arteries:  Normal origin. Optimum Fluoroscopic Angle for Delivery:  LAU 7 CAU 7 IMPRESSION: 1. Trileaflet aortic valve with heavily calcified leaflets and severe leaflet opening restriction. There are significant asymmetric calcifications extending into the LVOT under the non-coronary leaflet. Annular measurements suitable for delivery of a 29 mm Edwards-SAPIEN valve. 2. Sufficient annulus to coronary distance. 3. Optimum Fluoroscopic Angle for Delivery:  LAU 7 CAU 7. 4. No Thrombus in the left atrial appendage. 5. Dilated pulmonary artery measuring 33 x 32 mm suggestive of pulmonary hypertension. Ena Dawley Electronically Signed: By: Ena Dawley On: 07/12/2017 18:17   Dg Chest Port 1 View  Result Date: 07/24/2017 CLINICAL DATA:  Sore chest after TAVR EXAM: PORTABLE CHEST 1 VIEW COMPARISON:  07/23/2017 FINDINGS: Stable cardiomegaly when allowing for differences in projection. There is a pleural catheter at the right base. Vascular congestion. No evidence of pneumothorax or air bronchogram. Trace pleural effusions. Right IJ central line with tip at the SVC level. There has been tracheal and esophageal extubation. Aortic valve replacement cage. IMPRESSION: 1. Lower volumes after extubation. 2. Pulmonary vascular congestion and trace effusions. Electronically Signed   By: Monte Fantasia M.D.   On: 07/24/2017 07:19   Dg Chest Port 1 View  Result Date: 07/23/2017 CLINICAL DATA:  Aortic  valve repair. History of COPD, CHF, hypertension. Coronary artery disease . EXAM: PORTABLE CHEST 1 VIEW COMPARISON:  Chest x-ray 07/22/2017 . FINDINGS: Endotracheal tube tip 2 cm above the carina. Right IJ line tip noted over the superior vena cava. NG tube tip noted over the upper chest. Repositioning should be considered. Right chest tube noted over the lower chest. Aortic valve repair. Cardiomegaly with mild pulmonary venous congestion. Small right pleural effusion. Right pleural effusion is improved from 07/22/2017 . IMPRESSION: 1. NG tube tip noted over the upper chest most likely in the upper esophagus, repositioning should be considered. Advancement of 20-25 cm should be considered. Endotracheal tube with and right IJ line in good anatomic position. 2. Right chest tube noted over the lower right chest. Significant resolution of right pleural effusion .  3. Prior aortic valve repair. Cardiomegaly with mild pulmonary venous congestion. These results will be called to the ordering clinician or representative by the Radiologist Assistant, and communication documented in the PACS or zVision Dashboard. Electronically Signed   By: Marcello Moores  Register   On: 07/23/2017 11:36   Dg Chest Port 1v Same Day  Addendum Date: 07/10/2017   ADDENDUM REPORT: 07/10/2017 20:33 ADDENDUM: This addendum is given for the purpose of noting the impression in the initially dictated report is under the findings. The impression should read as follows: Large right pleural effusion associated airspace disease which could be atelectasis or pneumonia. Cardiomegaly and interstitial pulmonary edema. Electronically Signed   By: Inge Rise M.D.   On: 07/10/2017 20:33   Result Date: 07/10/2017 CLINICAL DATA:  Shortness of breath.  Hypoxia. EXAM: PORTABLE CHEST 1 VIEW COMPARISON:  None. FINDINGS: There is a large right pleural effusion with associated airspace disease. Cardiomegaly and interstitial edema are identified. No pneumothorax. No  left effusion. Large right pleural effusion and associated airspace disease which could be atelectasis or pneumonia. Cardiomegaly and interstitial pulmonary edema. IMPRESSION: No active disease. Electronically Signed: By: Inge Rise M.D. On: 07/09/2017 23:05   Ct Angio Chest Aorta W/cm &/or Wo/cm  Result Date: 07/16/2017 CLINICAL DATA:  76 year old male with history of severe aortic stenosis. Preprocedural study prior to potential transcatheter aortic valve replacement (TAVR) procedure. EXAM: CT ANGIOGRAPHY CHEST, ABDOMEN AND PELVIS TECHNIQUE: Multidetector CT imaging through the chest, abdomen and pelvis was performed using the standard protocol during bolus administration of intravenous contrast. Multiplanar reconstructed images and MIPs were obtained and reviewed to evaluate the vascular anatomy. CONTRAST:  100 mL of Isovue 370. COMPARISON:  None. FINDINGS: CTA CHEST FINDINGS Cardiovascular: Cardiomegaly. There is no significant pericardial fluid, thickening or pericardial calcification. There is aortic atherosclerosis, as well as atherosclerosis of the great vessels of the mediastinum and the coronary arteries, including calcified atherosclerotic plaque in the left anterior descending coronary artery. Severe thickening calcification of the aortic valve. Mediastinum/Lymph Nodes: Multiple prominent borderline enlarged mediastinal and bilateral hilar lymph nodes are noted measuring up to 9 mm in short axis (nonspecific). Esophagus is unremarkable in appearance. No axillary lymphadenopathy. Lungs/Pleura: Patchy areas of ground-glass attenuation and septal thickening are noted throughout the lungs bilaterally, presumably related to a background of moderate pulmonary edema. Moderate right and small to moderate left pleural effusions lie dependently with associated areas of passive subsegmental atelectasis in the dependent lung bases. No definite suspicious appearing pulmonary nodules or masses are noted.  Musculoskeletal/Soft Tissues: There are no aggressive appearing lytic or blastic lesions noted in the visualized portions of the skeleton. CTA ABDOMEN AND PELVIS FINDINGS Hepatobiliary: No cystic or solid hepatic lesions. No intra or extrahepatic biliary ductal dilatation. Gallbladder is unremarkable in appearance. Pancreas: No pancreatic mass. No pancreatic ductal dilatation. No pancreatic or peripancreatic fluid or inflammatory changes. Spleen: Unremarkable. Adrenals/Urinary Tract: Subcentimeter low-attenuation lesion in the right kidney is too small to definitively characterize, but is statistically likely a cyst. Left kidney and bilateral adrenal glands are normal in appearance. No hydroureteronephrosis. Urinary bladder is normal in appearance. Stomach/Bowel: Normal appearance of the stomach. No pathologic dilatation of small bowel or colon. A few scattered colonic diverticulae are noted, without surrounding inflammatory changes to suggest an acute diverticulitis at this time. Normal appendix. Vascular/Lymphatic: Aortic atherosclerosis with vascular findings and measurements pertinent to potential TAVR procedure, as detailed below. No aneurysm or dissection noted in the abdominal or pelvic vasculature. Celiac axis, superior mesenteric artery  and inferior mesenteric artery are all widely patent without hemodynamically significant stenosis. Celiac axis is ectatic measuring up to 9 mm in diameter. Two right and 2 left renal arteries are all widely patent without hemodynamically significant stenosis. No lymphadenopathy noted in the abdomen or pelvis. Reproductive: Prostate gland and seminal vesicles are unremarkable in appearance. Other: No significant volume of ascites.  No pneumoperitoneum. 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 There are no aggressive appearing lytic or blastic lesions noted  in the visualized portions of the skeleton. VASCULAR MEASUREMENTS PERTINENT TO TAVR: AORTA: Minimal Aortic Diameter -  11 x 9 mm Severity of Aortic Calcification -  moderate to severe RIGHT PELVIS: Right Common Iliac Artery - Minimal Diameter - 7.9 x 6.7 mm Tortuosity - mild Calcification - mild Right External Iliac Artery - Minimal Diameter - 7.7 x 8.9 mm Tortuosity - mild Calcification - none Right Common Femoral Artery - Minimal Diameter - 7.6 x 6.3 mm Tortuosity - mild Calcification - mild to moderate LEFT PELVIS: Left Common Iliac Artery - Minimal Diameter - 6.5 x 5.9 mm Tortuosity - mild Calcification - mild to moderate Left External Iliac Artery - Minimal Diameter - 7.0 x 5.9 mm Tortuosity - mild Calcification - none Left Common Femoral Artery - Minimal Diameter - 7.7 x 5.7 mm Tortuosity - mild Calcification - mild to moderate Review of the MIP images confirms the above findings. IMPRESSION: 1. Vascular findings and measurements pertinent to potential TAVR procedure, as detailed above. This patient does have suitable pelvic arterial access bilaterally. 2. Severe thickening calcification of the aortic valve, compatible with the reported clinical history of severe aortic stenosis. 3. Findings in the thorax suggestive of underlying congestive heart failure, including moderate pulmonary edema, moderate right and small a moderate left pleural effusions, and cardiomegaly. 4. Aortic atherosclerosis, in addition to left anterior descending coronary artery disease. Assessment for potential risk factor modification, dietary therapy or pharmacologic therapy may be warranted, if clinically indicated. 5. Colonic diverticulosis without evidence to suggest an acute diverticulitis at this time. 6. Additional incidental findings, as above. Aortic Atherosclerosis (ICD10-I70.0). Electronically Signed   By: Vinnie Langton M.D.   On: 07/16/2017 06:55   Ct Angio Abd/pel W/ And/or W/o  Result Date: 07/16/2017 CLINICAL DATA:   76 year old male with history of severe aortic stenosis. Preprocedural study prior to potential transcatheter aortic valve replacement (TAVR) procedure. EXAM: CT ANGIOGRAPHY CHEST, ABDOMEN AND PELVIS TECHNIQUE: Multidetector CT imaging through the chest, abdomen and pelvis was performed using the standard protocol during bolus administration of intravenous contrast. Multiplanar reconstructed images and MIPs were obtained and reviewed to evaluate the vascular anatomy. CONTRAST:  100 mL of Isovue 370. COMPARISON:  None. FINDINGS: CTA CHEST FINDINGS Cardiovascular: Cardiomegaly. There is no significant pericardial fluid, thickening or pericardial calcification. There is aortic atherosclerosis, as well as atherosclerosis of the great vessels of the mediastinum and the coronary arteries, including calcified atherosclerotic plaque in the left anterior descending coronary artery. Severe thickening calcification of the aortic valve. Mediastinum/Lymph Nodes: Multiple prominent borderline enlarged mediastinal and bilateral hilar lymph nodes are noted measuring up to 9 mm in short axis (nonspecific). Esophagus is unremarkable in appearance. No axillary lymphadenopathy. Lungs/Pleura: Patchy areas of ground-glass attenuation and septal thickening are noted throughout the lungs bilaterally, presumably related to a background of moderate pulmonary edema. Moderate right and small to moderate left pleural effusions lie dependently with associated areas of passive subsegmental atelectasis  in the dependent lung bases. No definite suspicious appearing pulmonary nodules or masses are noted. Musculoskeletal/Soft Tissues: There are no aggressive appearing lytic or blastic lesions noted in the visualized portions of the skeleton. CTA ABDOMEN AND PELVIS FINDINGS Hepatobiliary: No cystic or solid hepatic lesions. No intra or extrahepatic biliary ductal dilatation. Gallbladder is unremarkable in appearance. Pancreas: No pancreatic mass. No  pancreatic ductal dilatation. No pancreatic or peripancreatic fluid or inflammatory changes. Spleen: Unremarkable. Adrenals/Urinary Tract: Subcentimeter low-attenuation lesion in the right kidney is too small to definitively characterize, but is statistically likely a cyst. Left kidney and bilateral adrenal glands are normal in appearance. No hydroureteronephrosis. Urinary bladder is normal in appearance. Stomach/Bowel: Normal appearance of the stomach. No pathologic dilatation of small bowel or colon. A few scattered colonic diverticulae are noted, without surrounding inflammatory changes to suggest an acute diverticulitis at this time. Normal appendix. Vascular/Lymphatic: Aortic atherosclerosis with vascular findings and measurements pertinent to potential TAVR procedure, as detailed below. No aneurysm or dissection noted in the abdominal or pelvic vasculature. Celiac axis, superior mesenteric artery and inferior mesenteric artery are all widely patent without hemodynamically significant stenosis. Celiac axis is ectatic measuring up to 9 mm in diameter. Two right and 2 left renal arteries are all widely patent without hemodynamically significant stenosis. No lymphadenopathy noted in the abdomen or pelvis. Reproductive: Prostate gland and seminal vesicles are unremarkable in appearance. Other: No significant volume of ascites.  No pneumoperitoneum. 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 There are no aggressive appearing lytic or blastic lesions noted in the visualized portions of the skeleton. VASCULAR MEASUREMENTS PERTINENT TO TAVR: AORTA: Minimal Aortic Diameter -  11 x 9 mm Severity of Aortic Calcification -  moderate to severe RIGHT PELVIS: Right Common Iliac Artery - Minimal Diameter - 7.9 x 6.7 mm Tortuosity - mild Calcification - mild Right External Iliac Artery - Minimal Diameter - 7.7 x  8.9 mm Tortuosity - mild Calcification - none Right Common Femoral Artery - Minimal Diameter - 7.6 x 6.3 mm Tortuosity - mild Calcification - mild to moderate LEFT PELVIS: Left Common Iliac Artery - Minimal Diameter - 6.5 x 5.9 mm Tortuosity - mild Calcification - mild to moderate Left External Iliac Artery - Minimal Diameter - 7.0 x 5.9 mm Tortuosity - mild Calcification - none Left Common Femoral Artery - Minimal Diameter - 7.7 x 5.7 mm Tortuosity - mild Calcification - mild to moderate Review of the MIP images confirms the above findings. IMPRESSION: 1. Vascular findings and measurements pertinent to potential TAVR procedure, as detailed above. This patient does have suitable pelvic arterial access bilaterally. 2. Severe thickening calcification of the aortic valve, compatible with the reported clinical history of severe aortic stenosis. 3. Findings in the thorax suggestive of underlying congestive heart failure, including moderate pulmonary edema, moderate right and small a moderate left pleural effusions, and cardiomegaly. 4. Aortic atherosclerosis, in addition to left anterior descending coronary artery disease. Assessment for potential risk factor modification, dietary therapy or pharmacologic therapy may be warranted, if clinically indicated. 5. Colonic diverticulosis without evidence to suggest an acute diverticulitis at this time. 6. Additional incidental findings, as above. Aortic Atherosclerosis (ICD10-I70.0). Electronically Signed   By: Vinnie Langton M.D.   On: 07/16/2017 06:55   Ir Thoracentesis Asp Pleural Space W/img Guide  Result Date: 07/11/2017 INDICATION: Dyspnea. Hypoxia. Bilateral pleural effusion. Request for diagnostic and therapeutic thoracentesis. EXAM: ULTRASOUND GUIDED RIGHT THORACENTESIS MEDICATIONS:  1% Lidocaine = 10 mL COMPLICATIONS: None immediate. PROCEDURE: An ultrasound guided thoracentesis was thoroughly discussed with the patient and questions answered. The benefits,  risks, alternatives and complications were also discussed. The patient understands and wishes to proceed with the procedure. Written consent was obtained. Ultrasound was performed to localize and mark an adequate pocket of fluid in the right chest. The area was then prepped and draped in the normal sterile fashion. 1% Lidocaine was used for local anesthesia. Under ultrasound guidance a 6 Fr Safe-T-Centesis catheter was introduced. Thoracentesis was performed. The catheter was removed and a dressing applied. FINDINGS: A total of approximately 2 liters of clear orange fluid was removed. Samples were sent to the laboratory as requested by the clinical team. IMPRESSION: Successful ultrasound guided right thoracentesis yielding 2 liters of pleural fluid. Chest X-ray reveals no pneumothorax. Read by:  Gareth Eagle, PA-C Electronically Signed   By: Jacqulynn Cadet M.D.   On: 07/11/2017 14:42     Diagnostic Studies/Procedures    2D ECHO: 07/09/2017 LV EF: 25% - 30% Study Conclusion - Left ventricle: The cavity size was mildly dilated. Wall thickness was increased in a pattern of mild LVH. Systolic function was severely reduced. The estimated ejection fraction was in the range of 25% to 30%. Diffuse hypokinesis. - Aortic valve: Valve mobility was restricted. There was severe stenosis. There was mild to moderate regurgitation. Valve area (VTI): 0.41 cm^2. Valve area (Vmax): 0.35 cm^2. Valve area (Vmean): 0.39 cm^2. - Left atrium: The atrium was moderately dilated. - Right ventricle: The cavity size was moderately dilated. Systolic function was moderately reduced. - Right atrium: The atrium was moderately dilated. - Pulmonary arteries: Systolic pressure was severely increased. PA peak pressure: 79 mm Hg (S). - Pericardium, extracardiac: There was a left pleural effusion. Impressions: - Severe global reduction in LV systolic function; mild LVE and LVH; calcified aortic valve with  severe AS and mild to moderate AI; moderate LAE; moderate RVE with moderately reduced function; mild TR with severely elevated pulmonary pressure.   07/10/17 RIGHT HEART CATH AND CORONARY ANGIOGRAPHY  Conclusion  1. Severe single-vessel coronary artery disease with severe stenosis in the mid LAD and otherwise mild diffuse nonobstructive calcific coronary disease 2. Known severe aortic stenosis with very heavy calcification of the aortic valve on plain fluoroscopy and severely restricted aortic leaflet mobility 3. Severe pulmonary hypertension likely secondary to left heart disease with severely elevated pulmonary capillary wedge pressure  Recommendation: Continued evaluation by the multidisciplinary heart valve team. Likely proceed with PCI of the LAD followed by TAVR pending cardiac surgical consultation    Cardiac CT 07/12/17 IMPRESSION: 1. Trileaflet aortic valve with heavily calcified leaflets and severe leaflet opening restriction. There are significant asymmetric calcifications extending into the LVOT under the non-coronary leaflet. Annular measurements suitable for delivery of a 29 mm Edwards-SAPIEN valve. 2. Sufficient annulus to coronary distance. 3. Optimum Fluoroscopic Angle for Delivery: LAU 7 CAU 7. 4. No Thrombus in the left atrial appendage. 5. Dilated pulmonary artery measuring 33 x 32 mm suggestive of pulmonary hypertension.   Carotid doppler 07/14/17 Summary: Bilateral: mild soft plaque CCA. Mild mixed plaque origin and proximal ICA. 1-39% ICA plaquing. vertebral artery flow is antegrade.   CT angio abdomen/pelvis/chest: 07/12/17 IMPRESSION: 1. Vascular findings and measurements pertinent to potential TAVR procedure, as detailed above. This patient does have suitable pelvic arterial access bilaterally. 2. Severe thickening calcification of the aortic valve, compatible with the reported clinical history of severe aortic stenosis. 3. Findings in  the thorax suggestive of underlying congestive heart failure, including moderate pulmonary edema, moderate right and small a moderate left pleural effusions, and cardiomegaly. 4. Aortic atherosclerosis, in addition to left anterior descending coronary artery disease. Assessment for potential risk factor modification, dietary therapy or pharmacologic therapy may be warranted, if clinically indicated. 5. Colonic diverticulosis without evidence to suggest an acute diverticulitis at this time. 6. Additional incidental findings, as above. Aortic Atherosclerosis (ICD10-I70.0).  07/14/17 CORONARY STENT INTERVENTION  Conclusion  1. Severe stenosis mid LAD 2. Successful PTCA/DES x 1 mid LAD  Recommendations: Will continue DAPT with ASA and Plavix for at least 3 months but longer if he tolerates. Continue planning for TAVR.     Post operative echo 07/24/17 - Left ventricle: Mid an basal inferior wall akinesis The cavity size was mildly dilated. There was mild concentric hypertrophy. Systolic function was moderately reduced. The estimated ejection fraction was in the range of 35% to 40%. Doppler parameters are consistent with abnormal left ventricular relaxation (grade 1 diastolic dysfunction). - Aortic valve: 29 mm Sapien 3 valve with moderate peri valvular regurgitation. Valve area (VTI): 2.02 cm^2. Valve area (Vmax): 1.64 cm^2. Valve area (Vmean): 1.96 cm^2. - Left atrium: The atrium was mildly dilated. - Atrial septum: No defect or patent foramen ovale was identified. - Pulmonary arteries: PA peak pressure: 41 mm Hg (S).    Disposition   Pt is being discharged home today in good condition.  Follow-up Plans & Appointments    Follow-up Information    Call Lenn Cal, DDS.   Specialty:  Dentistry Why:  follow-up appointment with Dr. Enrique Strickland for evaluation of healing and suture removal once discharged. Contact information: [place] [address] [phone]        Eileen Stanford, PA-C. Go on 08/01/2017.   Specialties:  Cardiology, Radiology Why:  @ 2:15 pm Contact information: [address] [place] [phone]          DISCHARGE INSTRUCTIONS:  ACTIVITY AND EXERCISE . Daily activity and exercise are an important part of your recovery. People recover at different rates depending on their general health and type of valve procedure. . Most people recovering from TAVR feel better relatively quickly  . No lifting, pushing, pulling more than 10 pounds (examples to avoid: groceries, vacuuming, gardening, golfing):             - For one week with a procedure through the groin.             - For six weeks for procedures through the chest wall.             - For three months for procedures through the breast-bone. NOTE: You will typically see one of our providers 7-10 days after your procedure to discuss [place] the above activities.    DRIVING . Do not drive for until you are seen for follow up and cleared by a provider. . If you have been told by your doctor in the past that you may not drive, you must talk with him/her before you begin driving again.   DRESSING . Groin site: you may leave the clear dressing over the site for up to one week or until it falls off.   HYGIENE . If you had a femoral (leg) procedure, you may take a shower when you return home. After the shower, pat the site dry. Do NOT use powder, oils or lotions in your groin  area until the site has completely healed. . If you had a chest procedure, you may shower when you return home unless specifically instructed not to by your discharging practitioner.             - DO NOT scrub incision; pat dry with a towel             - DO NOT apply any lotions, oils, powders to the incision             - No tub baths / swimming for at least 2 weeks. . If you notice any fevers, chills, increased pain, swelling,  bleeding or pus, please contact your doctor.  ADDITIONAL INFORMATION . If you are going to have an upcoming dental procedure, please contact our office as you will require antibiotics ahead of time to prevent infection on your heart valve.    Discharge Medications     Medication List    STOP taking these medications   lisinopril 10 MG tablet Commonly known as:  PRINIVIL,ZESTRIL   metoprolol tartrate 25 MG tablet Commonly known as:  LOPRESSOR     TAKE these medications   aspirin EC 81 MG tablet Take 81 mg by mouth daily.   atorvastatin 80 MG tablet Commonly known as:  LIPITOR Take 1 tablet (80 mg total) by mouth daily at 6 PM.   carvedilol 3.125 MG tablet Commonly known as:  COREG Take 1 tablet (3.125 mg total) by mouth 2 (two) times daily with a meal.   clopidogrel 75 MG tablet Commonly known as:  PLAVIX Take 1 tablet (75 mg total) by mouth daily.   furosemide 40 MG tablet Commonly known as:  LASIX Take 40 mg by mouth daily.   potassium chloride SA 20 MEQ tablet Commonly known as:  K-DUR,KLOR-CON Take 1 tablet (20 mEq total) by mouth daily.         Outstanding Labs/Studies   None.   Duration of Discharge Encounter   Greater than 30 minutes including physician time.  Signed, Angelena Form PA-C 07/26/2017, 9:23 AM

## 2017-07-28 DIAGNOSIS — I2510 Atherosclerotic heart disease of native coronary artery without angina pectoris: Secondary | ICD-10-CM | POA: Diagnosis not present

## 2017-07-28 DIAGNOSIS — M6281 Muscle weakness (generalized): Secondary | ICD-10-CM | POA: Diagnosis not present

## 2017-07-28 DIAGNOSIS — J918 Pleural effusion in other conditions classified elsewhere: Secondary | ICD-10-CM | POA: Diagnosis not present

## 2017-07-28 DIAGNOSIS — D6489 Other specified anemias: Secondary | ICD-10-CM | POA: Diagnosis not present

## 2017-07-28 DIAGNOSIS — I255 Ischemic cardiomyopathy: Secondary | ICD-10-CM | POA: Diagnosis not present

## 2017-07-30 ENCOUNTER — Ambulatory Visit (HOSPITAL_COMMUNITY): Payer: Self-pay | Admitting: Dentistry

## 2017-07-30 ENCOUNTER — Encounter (HOSPITAL_COMMUNITY): Payer: Self-pay | Admitting: Dentistry

## 2017-07-30 VITALS — BP 131/54 | HR 80 | Temp 97.8°F

## 2017-07-30 DIAGNOSIS — Z952 Presence of prosthetic heart valve: Secondary | ICD-10-CM

## 2017-07-30 DIAGNOSIS — K08109 Complete loss of teeth, unspecified cause, unspecified class: Secondary | ICD-10-CM

## 2017-07-30 DIAGNOSIS — K0820 Unspecified atrophy of edentulous alveolar ridge: Secondary | ICD-10-CM

## 2017-07-30 DIAGNOSIS — K08199 Complete loss of teeth due to other specified cause, unspecified class: Secondary | ICD-10-CM

## 2017-07-30 NOTE — Progress Notes (Signed)
Cardiology Office Note    Date:  08/01/2017   ID:  Edward Strickland, DOB 1941/07/05, MRN [id number]  PCP:  Jaclynn Major, NP  Cardiologist: Dr. Burt Knack / Dr. Angelena Form (TAVR) - he can likely follow in [place] eventually   CC: 1 week TOC s/p TAVR   History of Present Illness:  Edward Strickland is a 76 y.o. male with a history of HTN, COPD, chronic systolic CHF, right pleural effusion s/p Pleur-X catheter placement, and severe aortic stenosis s/p TAVR (9/25) who presents to clinic for follow up.   The patient was originally diagnosed with severe aortic stenosis back in 2016, but LV function was normal. Review of previous echo report from [place] in 09/2015 showed EF 60-65%, trileaflet aortic valve with AVA 0.44 cm^2, mean gradient 66 mm Hg and peak gradient 89.   He lives alone in a house in [place]. He has a friend named [name] who lives close by and checks in on him. He is divorced and has no kids. He has no other living family.He is now retired but worked for [employer] for over 25 years. He previously smoked but quit remotely. He walks with a cane and drives his car to get his meals at a [employer].   He was recently admitted to [facility] from 2/99-3/71/69 for A/C systolic CHF in the setting of severe AS. 2D ECHO showed EF 25-30%, diffuse HK, severe AS, mod biatrial enlargement, moderate RV dilation, PA pressure 79 mm Hg. CXR showed R large pleural effusion that required thoracentesis. L/RHC 9/12 showed 90% mLAD stenosis and he underwent staged PCI/DESof the LAD on 9/19 after all his remaining teeth were pulled. He underwent successful TAVR with a 33mm Edwards Sapien valve via R TF approach as well as R pleur-X tube placement on 9/25. Post op echo showed moderate PVL and EF 35-40%. He was discharged on ASA and plavix, Coreg 3.125mg  BID and lasix 40mg  daily. His BP was too soft to add an ACE/ARB. He was discharged to a SNF given lack of social support and family.   Today he presents  to clinic for follow up. Doing great. Feels like he wants to get up and moving more. No CP or SOB. Some mild worsening LE edema. No orthopnea or PND. No dizziness or syncope. No blood in stool or urine. No palpitations.     Past Medical History:  Diagnosis Date  . Aortic stenosis, severe   . CAD (coronary artery disease)    a. 06/2017: s/p DES to LAD.   Marland Kitchen Chronic systolic CHF (congestive heart failure) ([place])   . COPD (chronic obstructive pulmonary disease) ([place])   . Hypertension   . S/P TAVR (transcatheter aortic valve replacement) 07/23/2017   29 mm Edwards Sapien 3 transcatheter heart valve placed via percutaneous right transfemoral approach    Past Surgical History:  Procedure Laterality Date  . CHEST TUBE INSERTION Right 07/23/2017   Procedure: INSERTION PLEURAL DRAINAGE CATHETER;  Surgeon: Rexene Alberts, MD;  Location: [place];  Service: Thoracic;  Laterality: Right;  . CORONARY STENT INTERVENTION N/A 07/17/2017   Procedure: CORONARY STENT INTERVENTION;  Surgeon: Burnell Blanks, MD;  Location: [place] [facility];  Service: Cardiovascular;  Laterality: N/A;  . IR THORACENTESIS ASP PLEURAL SPACE W/IMG GUIDE  07/11/2017  . MULTIPLE EXTRACTIONS WITH ALVEOLOPLASTY N/A 07/15/2017   Procedure: Extraction of tooth #'s 4,6,7,8,9,15, 20,21,22, 23 ,24,26,and 27 with alveoloplassty;  Surgeon: Lenn Cal, DDS;  Location: [place];  Service: Oral Surgery;  Laterality: N/A;  . TEE WITHOUT CARDIOVERSION N/A 07/23/2017   Procedure: TRANSESOPHAGEAL ECHOCARDIOGRAM (TEE);  Surgeon: Burnell Blanks, MD;  Location: [place];  Service: Open Heart Surgery;  Laterality: N/A;  . TRANSCATHETER AORTIC VALVE REPLACEMENT, TRANSFEMORAL N/A 07/23/2017   Procedure: TRANSCATHETER AORTIC VALVE REPLACEMENT, TRANSFEMORAL;  Surgeon: Burnell Blanks, MD;  Location: [place];  Service: Open Heart Surgery;  Laterality: N/A;    Current Medications: Outpatient Medications Prior to Visit  Medication Sig  Dispense Refill  . aspirin EC 81 MG tablet Take 81 mg by mouth daily.    Marland Kitchen atorvastatin (LIPITOR) 80 MG tablet Take 1 tablet (80 mg total) by mouth daily at 6 PM. 90 tablet 3  . carvedilol (COREG) 3.125 MG tablet Take 1 tablet (3.125 mg total) by mouth 2 (two) times daily with a meal. 60 tablet 6  . clopidogrel (PLAVIX) 75 MG tablet Take 1 tablet (75 mg total) by mouth daily. 90 tablet 3  . potassium chloride SA (K-DUR,KLOR-CON) 20 MEQ tablet Take 1 tablet (20 mEq total) by mouth daily. 30 tablet 6  . furosemide (LASIX) 40 MG tablet Take 40 mg by mouth daily.     No facility-administered medications prior to visit.      Allergies:   Patient has no known allergies.   Social History   Social History  . Marital status: Single    Spouse name: N/A  . Number of children: N/A  . Years of education: N/A   Social History Main Topics  . Smoking status: Former Research scientist (life sciences)  . Smokeless tobacco: Never Used  . Alcohol use None  . Drug use: Unknown  . Sexual activity: Not Asked   Other Topics Concern  . None   Social History Narrative  . None     Family History:  The patient's family history includes Congestive Heart Failure in his paternal uncle.      ROS:   Please see the history of present illness.    ROS All other systems reviewed and are negative.   PHYSICAL EXAM:   VS:  BP (!) 118/56   Pulse (!) 52   Ht 5\' 7"  (1.702 m)   Wt 144 lb 1.9 oz (65.4 kg)   BMI 22.57 kg/m    GEN: Well nourished, well developed, in no acute distress  HEENT: normal  Neck: no JVD, carotid bruits, or masses Cardiac: RRR; no murmurs, rubs, or gallops, 1+ bilateral pre tibial edema Respiratory:  clear to auscultation bilaterally, normal work of breathing GI: soft, nontender, nondistended, + BS MS: no deformity or atrophy  Skin: warm and dry, no rash Neuro:  Alert and Oriented x 3, Strength and sensation are intact Psych: euthymic mood, full affect    Wt Readings from Last 3 Encounters:  08/01/17  144 lb 1.9 oz (65.4 kg)  07/26/17 140 lb 6.9 oz (63.7 kg)      Studies/Labs Reviewed:   EKG:  EKG is ordered today.  The ekg ordered today demonstrates sinus with first degree AV block, LVH with repol abnormality, HR 72   Recent Labs: 07/22/2017: ALT 30 07/24/2017: Hemoglobin 10.0; Platelets 218 07/25/2017: BUN 14; Creatinine, Ser 0.90; Magnesium 2.0; Potassium 4.1; Sodium 136   Lipid Panel No results found for: CHOL, TRIG, HDL, CHOLHDL, VLDL, LDLCALC, LDLDIRECT  Additional studies/ records that were reviewed today include:  2D ECHO: 07/09/2017 LV EF: 25% - 30% Study Conclusion - Left ventricle: The cavity size was mildly dilated. Wall thickness was increased in a pattern of  mild LVH. Systolic function was severely reduced. The estimated ejection fraction was in the range of 25% to 30%. Diffuse hypokinesis. - Aortic valve: Valve mobility was restricted. There was severe stenosis. There was mild to moderate regurgitation. Valve area (VTI): 0.41 cm^2. Valve area (Vmax): 0.35 cm^2. Valve area (Vmean): 0.39 cm^2. - Left atrium: The atrium was moderately dilated. - Right ventricle: The cavity size was moderately dilated. Systolic function was moderately reduced. - Right atrium: The atrium was moderately dilated. - Pulmonary arteries: Systolic pressure was severely increased. PA peak pressure: 79 mm Hg (S). - Pericardium, extracardiac: There was a left pleural effusion. Impressions: - Severe global reduction in LV systolic function; mild LVE and LVH; calcified aortic valve with severe AS and mild to moderate AI; moderate LAE; moderate RVE with moderately reduced function; mild TR with severely elevated pulmonary pressure.   07/10/17 RIGHT HEART CATH AND CORONARY ANGIOGRAPHY  Conclusion  1. Severe single-vessel coronary artery disease with severe stenosis in the mid LAD and otherwise mild diffuse nonobstructive calcific coronary disease 2. Known severe  aortic stenosis with very heavy calcification of the aortic valve on plain fluoroscopy and severely restricted aortic leaflet mobility 3. Severe pulmonary hypertension likely secondary to left heart disease with severely elevated pulmonary capillary wedge pressure  Recommendation: Continued evaluation by the multidisciplinary heart valve team. Likely proceed with PCI of the LAD followed by TAVR pending cardiac surgical consultation    Cardiac CT 07/12/17 IMPRESSION: 1. Trileaflet aortic valve with heavily calcified leaflets and severe leaflet opening restriction. There are significant asymmetric calcifications extending into the LVOT under the non-coronary leaflet. Annular measurements suitable for delivery of a 29 mm Edwards-SAPIEN valve. 2. Sufficient annulus to coronary distance. 3. Optimum Fluoroscopic Angle for Delivery: LAU 7 CAU 7. 4. No Thrombus in the left atrial appendage. 5. Dilated pulmonary artery measuring 33 x 32 mm suggestive of pulmonary hypertension.   Carotid doppler 07/14/17 Summary: Bilateral: mild soft plaque CCA. Mild mixed plaque origin and proximal ICA. 1-39% ICA plaquing. vertebral artery flow is antegrade.   CT angio abdomen/pelvis/chest: 07/12/17 IMPRESSION: 1. Vascular findings and measurements pertinent to potential TAVR procedure, as detailed above. This patient does have suitable pelvic arterial access bilaterally. 2. Severe thickening calcification of the aortic valve, compatible with the reported clinical history of severe aortic stenosis. 3. Findings in the thorax suggestive of underlying congestive heart failure, including moderate pulmonary edema, moderate right and small a moderate left pleural effusions, and cardiomegaly. 4. Aortic atherosclerosis, in addition to left anterior descending coronary artery disease. Assessment for potential risk factor modification, dietary therapy or pharmacologic therapy may be warranted, if clinically  indicated. 5. Colonic diverticulosis without evidence to suggest an acute diverticulitis at this time. 6. Additional incidental findings, as above. Aortic Atherosclerosis (ICD10-I70.0).  07/14/17 CORONARY STENT INTERVENTION  Conclusion  1. Severe stenosis mid LAD 2. Successful PTCA/DES x 1 mid LAD  Recommendations: Will continue DAPT with ASA and Plavix for at least 3 months but longer if he tolerates. Continue planning for TAVR.     Post operative echo 07/24/17 - Left ventricle: Mid an basal inferior wall akinesis The cavity size was mildly dilated. There was mild concentric hypertrophy. Systolic function was moderately reduced. The estimated ejection fraction was in the range of 35% to 40%. Doppler parameters are consistent with abnormal left ventricular relaxation (grade 1 diastolic dysfunction). - Aortic valve: 29 mm Sapien 3 valve with moderate peri valvular regurgitation. Valve area (VTI): 2.02 cm^2. Valve area (Vmax): 1.64 cm^2. Valve  area (Vmean): 1.96 cm^2. - Left atrium: The atrium was mildly dilated. - Atrial septum: No defect or patent foramen ovale was identified. - Pulmonary arteries: PA peak pressure: 41 mm Hg (S).     ASSESSMENT & PLAN:   AS s/p TAVR: doing well s/p TAVR last week. Groin sites stable. Bandages removed. Continue ASA and plavix. We went over SBE prophylaxis if he needs to be fit for dentures. I will see him back in 3 weeks for echo and 1 month follow up  Chronic systolic CHF: continue Coreg 3.125mg  BID. His BP is stable today. I will add lisinopril 2.5mg  daily. He has some worsening pre tibial edema and weight up 4 lbs since discharge. I have asked him to increase his lasix from 40mg  to 60mg  daily .  Right pleural effusion: he reports his pleur-x tube is still draining fluid. I have asked the facility to call us when it stops draining so I can arrange an appointment with TCTS for removal.  CAD: s/p DES to LAD on 9/19. No chest  pain. Continue ASA/plavix, statin and BB.   HTN: Bp well controlled today   COPD: stable.    Medication Adjustments/Labs and Tests Ordered: Current medicines are reviewed at length with the patient today.  Concerns regarding medicines are outlined above.  Medication changes, Labs and Tests ordered today are listed in the Patient Instructions below. Patient Instructions  Medication Instructions:  1. START LISINOPRIL 2.5 MG DAILY  2. INCREASE LASIX TO 60 MG DAILY  3. CONTINUE ALL OTHER MEDICATIONS AS PRESCRIBED   Labwork: TODAY BMET  Testing/Procedures: NONE ORDERED TODAY  Follow-Up: KEEP YOUR UPCOMING APPT WITH KATY Lillyanna Glandon 10/18 SAME DAY AS ECHO APPT 08/15/17 Any Other Special Instructions Will Be Listed Below (If Applicable).     If you need a refill on your cardiac medications before your next appointment, please call your pharmacy.      Signed, Angelena Form, PA-C  08/01/2017 3:15 PM    [facility] [place], [place], [place]  [address] Phone: [phone]; Fax: [phone]

## 2017-07-30 NOTE — Patient Instructions (Signed)
PLAN: 1. Continue salt water rinses as needed to aid healing 2. Brush tongue daily 3. Advance diet as tolerated currently using soft mechanical diet only. 4. Follow-up with a dentist of his choice for evaluation for upper and lower complete denture fabrication to start approximately August 29, 2017 based on adequate healing. Patient will need to find dentist that takes dental Medicaid.   Lenn Cal, DDS

## 2017-07-30 NOTE — Progress Notes (Signed)
POST OPERATIVE NOTE:  07/30/2017 Edward Strickland [id number]  VITALS: BP (!) 131/54 (BP Location: Left Arm)   Pulse 80   Temp 97.8 F (36.6 C) (Oral)   LABS:  Lab Results  Component Value Date   WBC 11.2 (H) 07/24/2017   HGB 10.0 (L) 07/24/2017   HCT 30.3 (L) 07/24/2017   MCV 88.1 07/24/2017   PLT 218 07/24/2017   BMET    Component Value Date/Time   NA 136 07/25/2017 0745   K 4.1 07/25/2017 0745   CL 103 07/25/2017 0745   CO2 25 07/25/2017 0745   GLUCOSE 77 07/25/2017 0745   BUN 14 07/25/2017 0745   CREATININE 0.90 07/25/2017 0745   CALCIUM 8.0 (L) 07/25/2017 0745   GFRNONAA >60 07/25/2017 0745   GFRAA >60 07/25/2017 0745    Lab Results  Component Value Date   INR 1.36 07/23/2017   INR 1.13 07/22/2017   INR 1.16 07/17/2017   No results found for: PTT   Edward Strickland is status post extraction remaining teeth with alveoloplasty in the operating room with general anesthesia on 07/15/2017. The patient then had TAVR procedure with Dr. Roxy Manns and Dr. Angelena Form on 07/23/2017. Patient now presents for evaluation of healing and suture removal.  SUBJECTIVE: The patient denies having any dental discomfort in his mouth. Patient thinks he may have several stitches that remain. Patient denies having any active bleeding at this time.  EXAM: There is no sign of infection, heme, or ooze. Patient is healing in by generalized primary closure with several areas healing in by secondary intention. Several sutures remain loosely. There is no evidence of sinus exposure. Valsalva maneuver was negative. The patient is now completely edentulous. There is atrophy of the edentulous alveolar ridges.  PROCEDURE: The patient was given a chlorhexidine gluconate rinse for 30 seconds. Sutures were then removed without complication. Patient tolerated the procedure well.  ASSESSMENT: Post operative course is consistent with dental procedures performed in the operating room with general anesthesia.  Loss  of teeth due to extraction Patient is now completely edentulous There is atrophy of the edentulous alveolar ridges.  PLAN: 1. Continue salt water rinses as needed to aid healing 2. Brush tongue daily 3. Advance diet as tolerated currently using soft mechanical diet only. 4. Follow-up with a dentist of his choice for evaluation for upper and lower complete denture fabrication to start approximately August 29, 2017 based on adequate healing. Patient will need to find dentist that takes dental Medicaid.   Lenn Cal, DDS

## 2017-08-01 ENCOUNTER — Encounter: Payer: Self-pay | Admitting: Physician Assistant

## 2017-08-01 ENCOUNTER — Ambulatory Visit (INDEPENDENT_AMBULATORY_CARE_PROVIDER_SITE_OTHER): Payer: Medicare Other | Admitting: Physician Assistant

## 2017-08-01 VITALS — BP 118/56 | HR 52 | Ht 67.0 in | Wt 144.1 lb

## 2017-08-01 DIAGNOSIS — J90 Pleural effusion, not elsewhere classified: Secondary | ICD-10-CM

## 2017-08-01 DIAGNOSIS — I2510 Atherosclerotic heart disease of native coronary artery without angina pectoris: Secondary | ICD-10-CM | POA: Diagnosis not present

## 2017-08-01 DIAGNOSIS — I5022 Chronic systolic (congestive) heart failure: Secondary | ICD-10-CM

## 2017-08-01 DIAGNOSIS — Z952 Presence of prosthetic heart valve: Secondary | ICD-10-CM

## 2017-08-01 DIAGNOSIS — J449 Chronic obstructive pulmonary disease, unspecified: Secondary | ICD-10-CM | POA: Diagnosis not present

## 2017-08-01 DIAGNOSIS — I10 Essential (primary) hypertension: Secondary | ICD-10-CM | POA: Diagnosis not present

## 2017-08-01 MED ORDER — FUROSEMIDE 40 MG PO TABS: 60.0000 mg | ORAL_TABLET | Freq: Every day | ORAL | 3 refills | Status: DC

## 2017-08-01 MED ORDER — LISINOPRIL 2.5 MG PO TABS: 2.5000 mg | ORAL_TABLET | Freq: Every day | ORAL | 3 refills | Status: DC

## 2017-08-01 NOTE — Patient Instructions (Addendum)
Medication Instructions:  1. START LISINOPRIL 2.5 MG DAILY  2. INCREASE LASIX TO 60 MG DAILY  3. CONTINUE ALL OTHER MEDICATIONS AS PRESCRIBED   Labwork: TODAY BMET  Testing/Procedures: NONE ORDERED TODAY  Follow-Up: KEEP YOUR UPCOMING APPT WITH KATY THOMPSON 10/18 SAME DAY AS ECHO APPT 08/15/17 Any Other Special Instructions Will Be Listed Below (If Applicable).     If you need a refill on your cardiac medications before your next appointment, please call your pharmacy.

## 2017-08-02 LAB — BASIC METABOLIC PANEL
BUN / CREAT RATIO: 20 (ref 10–24)
BUN: 22 mg/dL (ref 8–27)
CO2: 23 mmol/L (ref 20–29)
Calcium: 9 mg/dL (ref 8.6–10.2)
Chloride: 98 mmol/L (ref 96–106)
Creatinine, Ser: 1.12 mg/dL (ref 0.76–1.27)
GFR, EST AFRICAN AMERICAN: 73 mL/min/{1.73_m2} (ref >59)
GFR, EST NON AFRICAN AMERICAN: 63 mL/min/{1.73_m2} (ref >59)
Glucose: 122 mg/dL — ABNORMAL HIGH (ref 65–99)
Potassium: 5.2 mmol/L (ref 3.5–5.2)
Sodium: 138 mmol/L (ref 134–144)

## 2017-08-05 ENCOUNTER — Other Ambulatory Visit: Payer: Self-pay | Admitting: Physician Assistant

## 2017-08-05 ENCOUNTER — Telehealth: Payer: Self-pay | Admitting: Physician Assistant

## 2017-08-05 ENCOUNTER — Telehealth: Payer: Self-pay | Admitting: *Deleted

## 2017-08-05 NOTE — Telephone Encounter (Signed)
lmtcb to go over lab results .

## 2017-08-05 NOTE — Telephone Encounter (Signed)
I am taking care of this pam, thank you

## 2017-08-05 NOTE — Telephone Encounter (Signed)
-----   Message from Eileen Stanford, PA-C sent at 08/02/2017 11:49 AM EDT ----- Labs look great. No changes

## 2017-08-05 NOTE — Telephone Encounter (Signed)
Tammy the nurse supervisor with [facility] is concerned about patient's pleur-X catheter/drain. Patient will be discharged from facility on 08/08/17. Patient is currently homeless and having the drain would be next to impossible for patient to manage. Patient had 75 ml output from drain Yesterday. They will be draining/changing again tomorrow. Tammy wanted to know parameters for having the pleur-X catheter/drain removed. Will forward to Nell Range PA and nurse Theodosia Quay RN, since this is a TAVR patient.

## 2017-08-05 NOTE — Telephone Encounter (Signed)
New message    Vernell Barrier from [facility] is calling about pt Pleur-X drain. Please call.

## 2017-08-06 ENCOUNTER — Other Ambulatory Visit: Payer: Self-pay

## 2017-08-06 ENCOUNTER — Other Ambulatory Visit: Payer: Self-pay | Admitting: *Deleted

## 2017-08-06 DIAGNOSIS — I5021 Acute systolic (congestive) heart failure: Secondary | ICD-10-CM

## 2017-08-06 DIAGNOSIS — J90 Pleural effusion, not elsewhere classified: Secondary | ICD-10-CM

## 2017-08-06 NOTE — Patient Outreach (Signed)
[facility]) Care Management  08/06/2017  Edward Strickland [birth year] [id number]  Met with Edward Strickland, SW at facility. She reports that patient is homeless. He lives in his car, and moves his car around from place to place. Patient is using a friend's address. His friend does not want to be responsible for patient. They do allow patient to store items in a building on their property and they allow him to use a spare hidden key but have not given patient a key to their home.  Patient does not have a telephone Patient with Pleur-X drain that they hope to have removed before he discharges to increase level of safety upon discharge. Appointment scheduled to follow up on pleur-x drain 10/10 at 1pm.  Patient has no family except for a distant cousin who lives in another town.  Patient gets meals from local diner and pays $500 month for 3 meals a day.  SW states patient is due to discharge 08/08/17.    Spoke with patient at length about his situation and plan. Patient alert and oriented, neatly groomed and dressed.  Patient reports he does stay in his car but he does stay with his friend "Edward Strickland" and her spouse at times.  Patient reports they are the ones who helped him get to the ED, he states his legs started swelling and he knew if he did not get help he would not live past three weeks.  Patient reports he can drive or get transportation to his MD appointments Patient reports he will be able to get his medications.  Patient denies any pain or shortness of breath and states he is doing much better since he went to ED.  He states he has a set of scales in a building he can use for daily weights.   Patient very easily side tracked during conversation and would start talking about his ex-wife or money he was waiting to inherit after an "uncle passes away" Patient verbalizes several times during conversation that he will get him a "house on wheels" after he inherits money or "build me a $100,00  house" after inheriting money.   RNCM asked patient directly about his plan upon discharge, he reports he will go to [place], in [place] and stay in a house on his cousin's land, it has "running water, electricity, and heat"  RNCM reviewed [facility] program, patient states he does not need a Marine scientist, he agrees for [facility] LCSW to speak with him regarding resources. He does not want an apartment, he states he would rather stay in a "cheap motel" than an apartment. RNCM asked if he would like to stay somewhere like the rehab facility, he said no not really, he wants to be back out on his own.  RNCM left patient card and brochure. Patient states he will have his cousin Edward Strickland who lives in [place] call [place].  RNCM had to redirect patient focus several times during conversation, patient was alert and oriented x3.  RNCM discussed healthcare education and importance of follow up with his MDs about his new issues, he states he will take care of himself.   Spoke with [facility] assistant Director regarding patient case. She recommends referral to [facility] LCSW for resources, such as shelters and housing, food resources, but due to fact that patient lives out of car with no phone or address, will be unable to manage in community at this time.   Plan: Refer to [facility] LCSW for one time follow up for community  resources.  Edward Crochet. Laymond Purser, RN, BSN, [place] [phone]) Business Cell  [phone]) Toll Free Office

## 2017-08-07 ENCOUNTER — Ambulatory Visit: Payer: Medicare Other | Admitting: Physician Assistant

## 2017-08-07 ENCOUNTER — Other Ambulatory Visit: Payer: Self-pay | Admitting: *Deleted

## 2017-08-07 ENCOUNTER — Encounter (HOSPITAL_COMMUNITY)
Admission: RE | Disposition: A | Discharge status: Skilled Nursing Facility | Payer: Self-pay | Source: Ambulatory Visit | Attending: Thoracic Surgery (Cardiothoracic Vascular Surgery)

## 2017-08-07 ENCOUNTER — Ambulatory Visit (HOSPITAL_COMMUNITY): Payer: Medicare Other

## 2017-08-07 ENCOUNTER — Ambulatory Visit (HOSPITAL_COMMUNITY)
Admission: RE | Admit: 2017-08-07 | Discharge: 2017-08-07 | Disposition: A | Discharge status: Skilled Nursing Facility | Payer: Medicare Other | Source: Ambulatory Visit | Attending: Thoracic Surgery (Cardiothoracic Vascular Surgery) | Admitting: Thoracic Surgery (Cardiothoracic Vascular Surgery)

## 2017-08-07 DIAGNOSIS — I5022 Chronic systolic (congestive) heart failure: Secondary | ICD-10-CM | POA: Diagnosis not present

## 2017-08-07 DIAGNOSIS — I5020 Unspecified systolic (congestive) heart failure: Secondary | ICD-10-CM | POA: Insufficient documentation

## 2017-08-07 DIAGNOSIS — Z4682 Encounter for fitting and adjustment of non-vascular catheter: Secondary | ICD-10-CM | POA: Diagnosis not present

## 2017-08-07 DIAGNOSIS — J90 Pleural effusion, not elsewhere classified: Secondary | ICD-10-CM

## 2017-08-07 HISTORY — PX: REMOVAL OF PLEURAL DRAINAGE CATHETER: SHX5080

## 2017-08-07 SURGERY — REMOVAL, CLOSED DRAINAGE CATHETER SYSTEM, PLEURAL
Anesthesia: Monitor Anesthesia Care

## 2017-08-07 MED ORDER — LIDOCAINE HCL (PF) 1 % IJ SOLN
INTRAMUSCULAR | Status: AC
  Filled 2017-08-07: qty 30

## 2017-08-07 MED ORDER — LIDOCAINE HCL 2 % IJ SOLN
INTRAMUSCULAR | Status: AC
  Filled 2017-08-07: qty 20

## 2017-08-07 NOTE — Telephone Encounter (Signed)
lmtcb x 3 to go over results. Looks like pt was in the hospital and was d/c today.

## 2017-08-07 NOTE — Telephone Encounter (Signed)
-----   Message from Eileen Stanford, PA-C sent at 08/02/2017 11:49 AM EDT ----- Labs look great. No changes

## 2017-08-07 NOTE — Progress Notes (Signed)
      [address]       [place] [address]             [phone]     Brief procedure:     Removal of right pleurx catheter   Local: 5 cc 1% local lidocaine   Prep: betadine   Technique: usual   Result: catheter removed intact   Patient tolerated well

## 2017-08-07 NOTE — H&P (Signed)
      [name]             [phone]     Brief H+P    History : Right pleurx catheter previously placed following TAVR for recurrent pleural effusions. The drainage has significantly improved and Dr Roxy Manns has requested removal. He denies SOB or orthopnea. Tube was drained yesterday <100 cc per patient report.     Exam: WD adult elderly male in NAD  Catheter site without evidence of infection      A/P Systolic  CHF with pleural effusion, now resolved  Will remove catheter, see separate note   Edward Strickland E, PA-C

## 2017-08-07 NOTE — Progress Notes (Signed)
Right Pleurx removed by Jadene Pierini PA.Dressing dry and intact.  Pt. Instructed to leave dressing on for 24 hrs. Then .remove and may shower.  Tolerated procedure well.  Transportation called for pt. To return to [facility].

## 2017-08-08 ENCOUNTER — Encounter (HOSPITAL_COMMUNITY): Payer: Self-pay | Admitting: Thoracic Surgery (Cardiothoracic Vascular Surgery)

## 2017-08-09 NOTE — Patient Outreach (Signed)
[name]) Care Management  08/09/2018  Edward Strickland 10/12/1941 [id number]     CSW was able to make initial contact with patient on 08/07/17 by phone. Patient rehabilitating at [facility].  CSW introduced self, explained role and types of services provided through [facility] ([facility]).  CSW then explained the reason for the call as he is eligible for [facility] services/support and while at SNF plans to meet.  Patient agreed and indicated plans for dc on 10/12 and his plans to return to "[place]" or got to "[place], [place]".  CSW was also made aware that patient would be going to see his heart MD on Thursday before SNF dc.  CSW was able to meet patient in person on 10/11 at his [place] office.  CSW interviewed patient and provided [facility] packet. Patient denies being homeless and/or living in his car. He chuckled and said, "I don't live in my car- I have a place- and there's a bed in it".  CSW provided some resource materials to him for housing, food support and for a "government free phone- he reports plans to go to [place] and buy a cheap phone.  He gave CSW permission to contact him through his friend [place]'s # until he gets his own phone.  CSW offered to meet him next week when he returns to the Heart MD's office for a follow up visit and to discuss his plans, needs and interest in participating with [place], MSW, [place] Worker  [place] [phone]

## 2017-08-09 NOTE — Patient Outreach (Signed)
[facility]) Care Management  08/09/2017  Edward Strickland 11/15/1940 [id number]

## 2017-08-13 DIAGNOSIS — I10 Essential (primary) hypertension: Secondary | ICD-10-CM | POA: Diagnosis not present

## 2017-08-13 DIAGNOSIS — I509 Heart failure, unspecified: Secondary | ICD-10-CM | POA: Diagnosis not present

## 2017-08-13 DIAGNOSIS — E785 Hyperlipidemia, unspecified: Secondary | ICD-10-CM | POA: Diagnosis not present

## 2017-08-13 DIAGNOSIS — I25118 Atherosclerotic heart disease of native coronary artery with other forms of angina pectoris: Secondary | ICD-10-CM | POA: Diagnosis not present

## 2017-08-15 ENCOUNTER — Encounter: Payer: Self-pay | Admitting: Physician Assistant

## 2017-08-15 ENCOUNTER — Ambulatory Visit (INDEPENDENT_AMBULATORY_CARE_PROVIDER_SITE_OTHER): Payer: Medicare Other | Admitting: Physician Assistant

## 2017-08-15 ENCOUNTER — Ambulatory Visit (HOSPITAL_COMMUNITY): Payer: Medicare Other | Attending: Cardiovascular Disease

## 2017-08-15 ENCOUNTER — Other Ambulatory Visit: Payer: Self-pay | Admitting: *Deleted

## 2017-08-15 ENCOUNTER — Other Ambulatory Visit: Payer: Self-pay | Admitting: Physician Assistant

## 2017-08-15 ENCOUNTER — Other Ambulatory Visit: Payer: Self-pay

## 2017-08-15 VITALS — BP 124/40 | HR 59 | Ht 68.0 in | Wt 145.0 lb

## 2017-08-15 DIAGNOSIS — J90 Pleural effusion, not elsewhere classified: Secondary | ICD-10-CM | POA: Diagnosis not present

## 2017-08-15 DIAGNOSIS — Z953 Presence of xenogenic heart valve: Secondary | ICD-10-CM | POA: Diagnosis not present

## 2017-08-15 DIAGNOSIS — I371 Nonrheumatic pulmonary valve insufficiency: Secondary | ICD-10-CM | POA: Insufficient documentation

## 2017-08-15 DIAGNOSIS — I351 Nonrheumatic aortic (valve) insufficiency: Secondary | ICD-10-CM | POA: Insufficient documentation

## 2017-08-15 DIAGNOSIS — Z87891 Personal history of nicotine dependence: Secondary | ICD-10-CM | POA: Diagnosis not present

## 2017-08-15 DIAGNOSIS — J449 Chronic obstructive pulmonary disease, unspecified: Secondary | ICD-10-CM | POA: Insufficient documentation

## 2017-08-15 DIAGNOSIS — I2510 Atherosclerotic heart disease of native coronary artery without angina pectoris: Secondary | ICD-10-CM | POA: Diagnosis not present

## 2017-08-15 DIAGNOSIS — I350 Nonrheumatic aortic (valve) stenosis: Secondary | ICD-10-CM | POA: Diagnosis not present

## 2017-08-15 DIAGNOSIS — I10 Essential (primary) hypertension: Secondary | ICD-10-CM

## 2017-08-15 DIAGNOSIS — I130 Hypertensive heart and chronic kidney disease with heart failure and stage 1 through stage 4 chronic kidney disease, or unspecified chronic kidney disease: Secondary | ICD-10-CM | POA: Insufficient documentation

## 2017-08-15 DIAGNOSIS — N183 Chronic kidney disease, stage 3 (moderate): Secondary | ICD-10-CM | POA: Diagnosis not present

## 2017-08-15 DIAGNOSIS — Z952 Presence of prosthetic heart valve: Secondary | ICD-10-CM

## 2017-08-15 DIAGNOSIS — I5022 Chronic systolic (congestive) heart failure: Secondary | ICD-10-CM | POA: Diagnosis not present

## 2017-08-15 MED ORDER — LISINOPRIL 2.5 MG PO TABS: 2.5000 mg | ORAL_TABLET | Freq: Every day | ORAL | 3 refills | Status: DC

## 2017-08-15 NOTE — Progress Notes (Unsigned)
Cardiology Office Note    Date:  08/15/2017   ID:  Edward Strickland, DOB 1941/05/04, MRN [id number]  PCP:  Edward Major, NP  Cardiologist: Dr. Burt Knack  / Dr. Angelena Form (TAVR)  CC: 1 month follow up s/p TAVR   History of Present Illness:  Edward Strickland is a 76 y.o. male with a history of HTN, COPD, chronic systolic CHF, right pleural effusion s/p Pleur-X catheter placement, and severe aortic stenosis s/p TAVR (9/25) who presents to clinic for follow up.   The patient was originally diagnosed with severe aortic stenosis back in 2016, but LV function was normal. Review of previous echo report from [place] in 09/2015 showed EF 60-65%, trileaflet aortic valve with AVA 0.44 cm^2, mean gradient 66 mm Hg and peak gradient 89.   He lives alone in a house in [place]. He has a friend named [name] who lives close by and checks in on him. He is divorced and has no kids. He has no other living family.He is now retired but worked for [employer] for over 25 years. He previously smoked but quit remotely. He walks with a cane and drives his car to get his meals at a [employer].   He was recently admitted to [facility] from 7/89-3/81/01 for A/C systolic CHF in the setting of severe AS. 2D ECHO showed EF 25-30%, diffuse HK, severe AS, mod biatrial enlargement, moderate RV dilation, PA pressure 79 mm Hg. CXR showed R large pleural effusion that required thoracentesis. L/RHC 9/12 showed 90% mLAD stenosis and he underwent staged PCI/DESof the LAD on 9/19 after all his remaining teeth were pulled. He underwent successful TAVR with a 26mm Edwards Sapien valve via R TF approach as well as R pleur-X tube placement on 9/25. Post op echo showed moderate PVL and EF 35-40%. He was discharged on ASA and plavix, Coreg 3.125mg  BID and lasix 40mg  daily. His BP was too soft to add an ACE/ARB. He was discharged to a SNF given lack of social support and family.   Today he presents to clinic for follow up.     Past Medical History:  Diagnosis Date  . Aortic stenosis, severe   . CAD (coronary artery disease)    a. 06/2017: s/p DES to LAD.   Marland Kitchen Chronic systolic CHF (congestive heart failure) ([place])   . COPD (chronic obstructive pulmonary disease) ([place])   . Hypertension   . S/P TAVR (transcatheter aortic valve replacement) 07/23/2017   29 mm Edwards Sapien 3 transcatheter heart valve placed via percutaneous right transfemoral approach    Past Surgical History:  Procedure Laterality Date  . CHEST TUBE INSERTION Right 07/23/2017   Procedure: INSERTION PLEURAL DRAINAGE CATHETER;  Surgeon: Edward Alberts, MD;  Location: [place];  Service: Thoracic;  Laterality: Right;  . CORONARY STENT INTERVENTION N/A 07/17/2017   Procedure: CORONARY STENT INTERVENTION;  Surgeon: Burnell Blanks, MD;  Location: [facility];  Service: Cardiovascular;  Laterality: N/A;  . IR THORACENTESIS ASP PLEURAL SPACE W/IMG GUIDE  07/11/2017  . MULTIPLE EXTRACTIONS WITH ALVEOLOPLASTY N/A 07/15/2017   Procedure: Extraction of tooth #'s 4,6,7,8,9,15, 20,21,22, 23 ,24,26,and 27 with alveoloplassty;  Surgeon: Edward Strickland, DDS;  Location: [place];  Service: Oral Surgery;  Laterality: N/A;  . REMOVAL OF PLEURAL DRAINAGE CATHETER N/A 08/07/2017   Procedure: REMOVAL OF PLEURAL DRAINAGE CATHETER;  Surgeon: Edward Alberts, MD;  Location: [place];  Service: Thoracic;  Laterality: N/A;  . TEE WITHOUT CARDIOVERSION N/A 07/23/2017  Procedure: TRANSESOPHAGEAL ECHOCARDIOGRAM (TEE);  Surgeon: Burnell Blanks, MD;  Location: [place];  Service: Open Heart Surgery;  Laterality: N/A;  . TRANSCATHETER AORTIC VALVE REPLACEMENT, TRANSFEMORAL N/A 07/23/2017   Procedure: TRANSCATHETER AORTIC VALVE REPLACEMENT, TRANSFEMORAL;  Surgeon: Burnell Blanks, MD;  Location: [facility] [place];  Service: Open Heart Surgery;  Laterality: N/A;    Current Medications: Outpatient Medications Prior to Visit  Medication Sig Dispense Refill  .  aspirin EC 81 MG tablet Take 81 mg by mouth daily.    Marland Kitchen atorvastatin (LIPITOR) 80 MG tablet Take 1 tablet (80 mg total) by mouth daily at 6 PM. 90 tablet 3  . carvedilol (COREG) 3.125 MG tablet Take 1 tablet (3.125 mg total) by mouth 2 (two) times daily with a meal. 60 tablet 6  . clopidogrel (PLAVIX) 75 MG tablet Take 1 tablet (75 mg total) by mouth daily. 90 tablet 3  . diphenhydrAMINE (BENADRYL) 25 MG tablet Take 25 mg by mouth every 8 (eight) hours as needed for itching.    . furosemide (LASIX) 40 MG tablet Take 1.5 tablets (60 mg total) by mouth daily. 135 tablet 3  . lisinopril (PRINIVIL,ZESTRIL) 2.5 MG tablet Take 1 tablet (2.5 mg total) by mouth daily. 90 tablet 3  . potassium chloride SA (K-DUR,KLOR-CON) 20 MEQ tablet Take 1 tablet (20 mEq total) by mouth daily. 30 tablet 6   No facility-administered medications prior to visit.      Allergies:   Patient has no known allergies.   Social History   Social History  . Marital status: Single    Spouse name: N/A  . Number of children: N/A  . Years of education: N/A   Social History Main Topics  . Smoking status: Former Research scientist (life sciences)  . Smokeless tobacco: Never Used  . Alcohol use Not on file  . Drug use: Unknown  . Sexual activity: Not on file   Other Topics Concern  . Not on file   Social History Narrative  . No narrative on file     Family History:  The patient's family history includes Congestive Heart Failure in his paternal uncle.      ROS:   Please see the history of present illness.    ROS All other systems reviewed and are negative.   PHYSICAL EXAM:   VS:  BP (!) 124/56   Pulse (!) 59   Ht 5\' 7"  (1.702 m)   Wt 145 lb  GEN: Well nourished, well developed, in no acute distress  HEENT: normal  Neck: no JVD, carotid bruits, or masses Cardiac: RRR; no murmurs, rubs, or gallops,no edema  Respiratory:  clear to auscultation bilaterally, normal work of breathing GI: soft, nontender, nondistended, + BS MS: no  deformity or atrophy  Skin: warm and dry, no rash Neuro:  Alert and Oriented x 3, Strength and sensation are intact Psych: euthymic mood, full affect   Wt Readings from Last 3 Encounters:  08/15/17           145 lbs   08/01/17 144 lb 1.9 oz (65.4 kg)  07/26/17 140 lb 6.9 oz (63.7 kg)      Studies/Labs Reviewed:   EKG:  EKG is NOT ordered today.    Recent Labs: 07/22/2017: ALT 30 07/24/2017: Hemoglobin 10.0; Platelets 218 07/25/2017: Magnesium 2.0 08/01/2017: BUN 22; Creatinine, Ser 1.12; Potassium 5.2; Sodium 138   Lipid Panel No results found for: CHOL, TRIG, HDL, CHOLHDL, VLDL, LDLCALC, LDLDIRECT  Additional studies/ records that were reviewed today include:  2D ECHO: 07/09/2017 LV EF: 25% - 30% Study Conclusion - Left ventricle: The cavity size was mildly dilated. Wall thickness was increased in a pattern of mild LVH. Systolic function was severely reduced. The estimated ejection fraction was in the range of 25% to 30%. Diffuse hypokinesis. - Aortic valve: Valve mobility was restricted. There was severe stenosis. There was mild to moderate regurgitation. Valve area (VTI): 0.41 cm^2. Valve area (Vmax): 0.35 cm^2. Valve area (Vmean): 0.39 cm^2. - Left atrium: The atrium was moderately dilated. - Right ventricle: The cavity size was moderately dilated. Systolic function was moderately reduced. - Right atrium: The atrium was moderately dilated. - Pulmonary arteries: Systolic pressure was severely increased. PA peak pressure: 79 mm Hg (S). - Pericardium, extracardiac: There was a left pleural effusion. Impressions: - Severe global reduction in LV systolic function; mild LVE and LVH; calcified aortic valve with severe AS and mild to moderate AI; moderate LAE; moderate RVE with moderately reduced function; mild TR with severely elevated pulmonary pressure.   07/10/17 RIGHT HEART CATH AND CORONARY ANGIOGRAPHY  Conclusion  1. Severe single-vessel  coronary artery disease with severe stenosis in the mid LAD and otherwise mild diffuse nonobstructive calcific coronary disease 2. Known severe aortic stenosis with very heavy calcification of the aortic valve on plain fluoroscopy and severely restricted aortic leaflet mobility 3. Severe pulmonary hypertension likely secondary to left heart disease with severely elevated pulmonary capillary wedge pressure  Recommendation: Continued evaluation by the multidisciplinary heart valve team. Likely proceed with PCI of the LAD followed by TAVR pending cardiac surgical consultation    Cardiac CT 07/12/17 IMPRESSION: 1. Trileaflet aortic valve with heavily calcified leaflets and severe leaflet opening restriction. There are significant asymmetric calcifications extending into the LVOT under the non-coronary leaflet. Annular measurements suitable for delivery of a 29 mm Edwards-SAPIEN valve. 2. Sufficient annulus to coronary distance. 3. Optimum Fluoroscopic Angle for Delivery: LAU 7 CAU 7. 4. No Thrombus in the left atrial appendage. 5. Dilated pulmonary artery measuring 33 x 32 mm suggestive of pulmonary hypertension.   Carotid doppler 07/14/17 Summary: Bilateral: mild soft plaque CCA. Mild mixed plaque origin and proximal ICA. 1-39% ICA plaquing. vertebral artery flow is antegrade.   CT angio abdomen/pelvis/chest: 07/12/17 IMPRESSION: 1. Vascular findings and measurements pertinent to potential TAVR procedure, as detailed above. This patient does have suitable pelvic arterial access bilaterally. 2. Severe thickening calcification of the aortic valve, compatible with the reported clinical history of severe aortic stenosis. 3. Findings in the thorax suggestive of underlying congestive heart failure, including moderate pulmonary edema, moderate right and small a moderate left pleural effusions, and cardiomegaly. 4. Aortic atherosclerosis, in addition to left anterior  descending coronary artery disease. Assessment for potential risk factor modification, dietary therapy or pharmacologic therapy may be warranted, if clinically indicated. 5. Colonic diverticulosis without evidence to suggest an acute diverticulitis at this time. 6. Additional incidental findings, as above. Aortic Atherosclerosis (ICD10-I70.0).  07/14/17 CORONARY STENT INTERVENTION  Conclusion  1. Severe stenosis mid LAD 2. Successful PTCA/DES x 1 mid LAD  Recommendations: Will continue DAPT with ASA and Plavix for at least 3 months but longer if he tolerates. Continue planning for TAVR.     Post operative echo 07/24/17 - Left ventricle: Mid an basal inferior wall akinesis The cavity size was mildly dilated. There was mild concentric hypertrophy. Systolic function was moderately reduced. The estimated ejection fraction was in the range of 35% to 40%. Doppler parameters are consistent with abnormal left ventricular relaxation (grade  1 diastolic dysfunction). - Aortic valve: 29 mm Sapien 3 valve with moderate peri valvular regurgitation. Valve area (VTI): 2.02 cm^2. Valve area (Vmax): 1.64 cm^2. Valve area (Vmean): 1.96 cm^2. - Left atrium: The atrium was mildly dilated. - Atrial septum: No defect or patent foramen ovale was identified. - Pulmonary arteries: PA peak pressure: 41 mm Hg (S).   2D ECHO 08/15/17   ASSESSMENT & PLAN:   AS s/p TAVR:   Chronic systolic CHF:   Right pleural effusion:   CAD:  HTN:   COPD:    Medication Adjustments/Labs and Tests Ordered: Current medicines are reviewed at length with the patient today.  Concerns regarding medicines are outlined above.  Medication changes, Labs and Tests ordered today are listed in the Patient Instructions below. There are no Patient Instructions on file for this visit.   Signed, Angelena Form, PA-C  08/15/2017 3:52 PM    [facility] [place],  [place], [place]  [address] Phone: [phone]; Fax: [phone]

## 2017-08-15 NOTE — Progress Notes (Signed)
Cardiology Office Note    Date:  08/16/2017   ID:  Edward [facility], DOB Sep 02, 1941, MRN [id number]  PCP:  Edward Major, NP  Cardiologist: Dr. Burt [place] / Dr. Angelena [facility] (TAVR)  CC: 1 month follow up s/p TAVR  History of Present Illness:  Edward [facility] is a 76 y.o. male with a history of HTN, COPD, chronic systolic CHF, right pleural effusion s/p Pleur-X catheter placement, and severe aortic stenosis s/p TAVR (9/25) who presents to clinic for follow up.    The patient was originally diagnosed with severe aortic stenosis back in 2016, but LV function was normal. Review of previous echo report from [facility] in 09/2015 showed EF 60-65%, trileaflet aortic valve with AVA 0.44 cm^2, mean gradient 66 mm Hg and peak gradient 89.   He lives alone in a house in [place]. He has a friend named [facility] who lives close by and checks in on him. He is divorced and has no kids. He has no other living family.He is now retired but worked for [facility] for over 25 years. He previously smoked but quit remotely. He walks with a cane and drives his car to get his meals at a [facility] [place].   He was recently admitted to [facility] from 3/47-4/25/95 for A/C systolic CHF in the setting of severe AS. 2D ECHO showed EF 25-30%, diffuse HK, severe AS, mod biatrial enlargement, moderate RV dilation, PA pressure 79 mm Hg. CXR showed R large pleural effusion that required thoracentesis. L/RHC 9/12 showed 90% mLAD stenosis and he underwent staged PCI/DESof the LAD on 9/19 after all his remaining teeth were pulled. He underwent successful TAVR with a 47mm Edwards Sapien valve via R TF approach as well as R pleur-X tube placement on 9/25. Post op echo showed moderate PVL and EF 35-40%. He was discharged on ASA and plavix, Coreg 3.125mg  BID and lasix 40mg  daily. His BP was too soft to add an ACE/ARB. He was discharged to [facility] given lack of social support and family.   I saw him in the office on 08/01/17. He had some  worsening LE edema and his lasix was increased from 40mg  to 60mg  daily. His pleur-X was removed on 10/10 and he was discharged from [facility].   Today he presents to clinic for follow up. He is with his "caretaker"/ friend [facility]. She is his point of contact since he does not have a phone. He is feeling fantastic and [facility] [place] can really tell a difference in how he is acting since his surgery. His breathing is greatly improved and he is back to all his usual activities. His LE edema is much improved. He has no orthopnea or PND. No dizziness or syncope. No blood in stool or urine. No palpitations.   Past Medical History:  Diagnosis Date  . Aortic stenosis, severe   . CAD (coronary artery disease)    a. 06/2017: s/p DES to LAD.   Marland Kitchen Chronic systolic CHF (congestive heart failure) ([facility])   . COPD (chronic obstructive pulmonary disease) ([facility])   . Hypertension   . S/P TAVR (transcatheter aortic valve replacement) 07/23/2017   29 mm Edwards Sapien 3 transcatheter heart valve placed via percutaneous right transfemoral approach    Past Surgical History:  Procedure Laterality Date  . CHEST TUBE INSERTION Right 07/23/2017   Procedure: INSERTION PLEURAL DRAINAGE CATHETER;  Surgeon: [facility] Alberts, MD;  Location: [facility];  Service: Thoracic;  Laterality: Right;  . CORONARY STENT INTERVENTION N/A 07/17/2017  Procedure: CORONARY STENT INTERVENTION;  Surgeon: Edward Blanks, MD;  Location: [facility];  Service: Cardiovascular;  Laterality: N/A;  . IR THORACENTESIS ASP PLEURAL SPACE W/IMG GUIDE  07/11/2017  . MULTIPLE EXTRACTIONS WITH ALVEOLOPLASTY N/A 07/15/2017   Procedure: Extraction of tooth #'s 4,6,7,8,9,15, 20,21,22, 23 ,24,26,and 27 with alveoloplassty;  Surgeon: [facility], DDS;  Location: [facility];  Service: Oral Surgery;  Laterality: N/A;  . REMOVAL OF PLEURAL DRAINAGE CATHETER N/A 08/07/2017   Procedure: REMOVAL OF PLEURAL DRAINAGE CATHETER;  Surgeon: [facility] Alberts, MD;   Location: [facility];  Service: Thoracic;  Laterality: N/A;  . TEE WITHOUT CARDIOVERSION N/A 07/23/2017   Procedure: TRANSESOPHAGEAL ECHOCARDIOGRAM (TEE);  Surgeon: [place] Blanks, MD;  Location: [facility];  Service: Open Heart Surgery;  Laterality: N/A;  . TRANSCATHETER AORTIC VALVE REPLACEMENT, TRANSFEMORAL N/A 07/23/2017   Procedure: TRANSCATHETER AORTIC VALVE REPLACEMENT, TRANSFEMORAL;  Surgeon: Edward Blanks, MD;  Location: [facility];  Service: Open Heart Surgery;  Laterality: N/A;    Current Medications: Outpatient Medications Prior to Visit  Medication Sig Dispense Refill  . aspirin EC 81 MG tablet Take 81 mg by mouth daily.    Marland Kitchen atorvastatin (LIPITOR) 80 MG tablet Take 1 tablet (80 mg total) by mouth daily at 6 PM. 90 tablet 3  . carvedilol (COREG) 3.125 MG tablet Take 1 tablet (3.125 mg total) by mouth 2 (two) times daily with a meal. 60 tablet 6  . clopidogrel (PLAVIX) 75 MG tablet Take 1 tablet (75 mg total) by mouth daily. 90 tablet 3  . diphenhydrAMINE (BENADRYL) 25 MG tablet Take 25 mg by mouth every 8 (eight) hours as needed for itching.    . furosemide (LASIX) 40 MG tablet Take 1.5 tablets (60 mg total) by mouth daily. 135 tablet 3  . potassium chloride SA (K-DUR,KLOR-CON) 20 MEQ tablet Take 1 tablet (20 mEq total) by mouth daily. 30 tablet 6  . lisinopril (PRINIVIL,ZESTRIL) 2.5 MG tablet Take 1 tablet (2.5 mg total) by mouth daily. 90 tablet 3   No facility-administered medications prior to visit.      Allergies:   Patient has no known allergies.   Social History   Social History  . Marital status: Single    Spouse name: N/A  . Number of children: N/A  . Years of education: N/A   Social History Main Topics  . Smoking status: Former Research scientist (life sciences)  . Smokeless tobacco: Never Used  . Alcohol use None  . Drug use: Unknown  . Sexual activity: Not Asked   Other Topics Concern  . None   Social History Narrative  . None     Family History:  The patient's family  history includes Congestive Heart Failure in his paternal uncle.     ROS:   Please see the history of present illness.    ROS All other systems reviewed and are negative.   PHYSICAL EXAM:   VS:  BP (!) 124/40   Pulse (!) 59   Ht 5\' 8"  (1.727 m)   Wt 145 lb (65.8 kg)   BMI 22.05 kg/m    GEN: Well nourished, well developed, in no acute distress  HEENT: normal  Neck: no JVD, carotid bruits, or masses Cardiac: RRR; no murmurs, rubs, or gallops, trace pretibial edema  Respiratory:  clear to auscultation bilaterally, normal work of breathing GI: soft, nontender, nondistended, + BS MS: no deformity or atrophy  Skin: warm and dry, no rash Neuro:  Alert and Oriented x 3,  Strength and sensation are intact Psych: euthymic mood, full affect   Wt Readings from Last 3 Encounters:  08/15/17 145 lb (65.8 kg)  08/01/17 144 lb 1.9 oz (65.4 kg)  07/26/17 140 lb 6.9 oz (63.7 kg)      Studies/Labs Reviewed:   EKG:  EKG is NOT ordered today.   Recent Labs: 07/22/2017: ALT 30 07/24/2017: Hemoglobin 10.0; Platelets 218 07/25/2017: Magnesium 2.0 08/15/2017: BUN 12; Creatinine, Ser 0.96; Potassium 4.6; Sodium 142   Lipid Panel No results found for: CHOL, TRIG, HDL, CHOLHDL, VLDL, LDLCALC, LDLDIRECT  Additional studies/ records that were reviewed today include:  2D ECHO: 07/09/2017 LV EF: 25% - 30% Study Conclusion - Left ventricle: The cavity size was mildly dilated. Wall thickness was increased in a pattern of mild LVH. Systolic function was severely reduced. The estimated ejection fraction was in the range of 25% to 30%. Diffuse hypokinesis. - Aortic valve: Valve mobility was restricted. There was severe stenosis. There was mild to moderate regurgitation. Valve area (VTI): 0.41 cm^2. Valve area (Vmax): 0.35 cm^2. Valve area (Vmean): 0.39 cm^2. - Left atrium: The atrium was moderately dilated. - Right ventricle: The cavity size was moderately dilated. Systolic function  was moderately reduced. - Right atrium: The atrium was moderately dilated. - Pulmonary arteries: Systolic pressure was severely increased. PA peak pressure: 79 mm Hg (S). - Pericardium, extracardiac: There was a left pleural effusion. Impressions: - Severe global reduction in LV systolic function; mild LVE and LVH; calcified aortic valve with severe AS and mild to moderate AI; moderate LAE; moderate RVE with moderately reduced function; mild TR with severely elevated pulmonary pressure.   07/10/17 RIGHT HEART CATH AND CORONARY ANGIOGRAPHY  Conclusion  1. Severe single-vessel coronary artery disease with severe stenosis in the mid LAD and otherwise mild diffuse nonobstructive calcific coronary disease 2. Known severe aortic stenosis with very heavy calcification of the aortic valve on plain fluoroscopy and severely restricted aortic leaflet mobility 3. Severe pulmonary hypertension likely secondary to left heart disease with severely elevated pulmonary capillary wedge pressure  Recommendation: Continued evaluation by the multidisciplinary heart valve team. Likely proceed with PCI of the LAD followed by TAVR pending cardiac surgical consultation    Cardiac CT 07/12/17 IMPRESSION: 1. Trileaflet aortic valve with heavily calcified leaflets and severe leaflet opening restriction. There are significant asymmetric calcifications extending into the LVOT under the non-coronary leaflet. Annular measurements suitable for delivery of a 29 mm Edwards-SAPIEN valve. 2. Sufficient annulus to coronary distance. 3. Optimum Fluoroscopic Angle for Delivery: LAU 7 CAU 7. 4. No Thrombus in the left atrial appendage. 5. Dilated pulmonary artery measuring 33 x 32 mm suggestive of pulmonary hypertension.   Carotid doppler 07/14/17 Summary: Bilateral: mild soft plaque CCA. Mild mixed plaque origin and proximal ICA. 1-39% ICA plaquing. vertebral artery flow is antegrade.   CT angio  abdomen/pelvis/chest: 07/12/17 IMPRESSION: 1. Vascular findings and measurements pertinent to potential TAVR procedure, as detailed above. This patient does have suitable pelvic arterial access bilaterally. 2. Severe thickening calcification of the aortic valve, compatible with the reported clinical history of severe aortic stenosis. 3. Findings in the thorax suggestive of underlying congestive heart failure, including moderate pulmonary edema, moderate right and small a moderate left pleural effusions, and cardiomegaly. 4. Aortic atherosclerosis, in addition to left anterior descending coronary artery disease. Assessment for potential risk factor modification, dietary therapy or pharmacologic therapy may be warranted, if clinically indicated. 5. Colonic diverticulosis without evidence to suggest an acute diverticulitis at this time. 6.  Additional incidental findings, as above. Aortic Atherosclerosis (ICD10-I70.0).   07/14/17 CORONARY STENT INTERVENTION  Conclusion  1. Severe stenosis mid LAD 2. Successful PTCA/DES x 1 mid LAD Recommendations: Will continue DAPT with ASA and Plavix for at least 3 months but longer if he tolerates. Continue planning for TAVR.     Post operative echo 07/24/17 - Left ventricle: Mid an basal inferior wall akinesis The cavity size was mildly dilated. There was mild concentric hypertrophy. Systolic function was moderately reduced. The estimated ejection fraction was in the range of 35% to 40%. Doppler parameters are consistent with abnormal left ventricular relaxation (grade 1 diastolic dysfunction). - Aortic valve: 29 mm Sapien 3 valve with moderate peri valvular regurgitation. Valve area (VTI): 2.02 cm^2. Valve area (Vmax): 1.64 cm^2. Valve area (Vmean): 1.96 cm^2. - Left atrium: The atrium was mildly dilated. - Atrial septum: No defect or patent foramen ovale was identified. - Pulmonary arteries: PA peak pressure: 41 mm Hg  (S).   2D ECHO: 08/13/17 Study Conclusions - Left ventricle: The cavity size was normal. Systolic function was   mildly to moderately reduced. The estimated ejection fraction was   in the range of 40% to 45%. Mild hypokinesis of the inferior   myocardium. Doppler parameters are consistent with abnormal left   ventricular relaxation (grade 1 diastolic dysfunction). Doppler   parameters are consistent with high ventricular filling pressure. - Aortic valve: A 29 mm Sapien 3 bioprosthesis was present.   Transvalvular velocity was within the normal range. There was no   stenosis. There was moderate regurgitation. Regurgitation   pressure half-time: 464 ms. - Mitral valve: Transvalvular velocity was within the normal range.   There was no evidence for stenosis. There was no regurgitation. - Left atrium: The atrium was severely dilated. - Right ventricle: The cavity size was normal. Wall thickness was   normal. Systolic function was normal. - Tricuspid valve: There was trivial regurgitation. - Pulmonary arteries: Systolic pressure was within the normal   range. PA peak pressure: 29 mm Hg (S).   ASSESSMENT & PLAN:   AS s/p TAVR: doing excellent after valve replacement. He has NHYA class I symptoms. 2D ECHO today showed a well seated TAVR valve with moderate AI; mean gradient 10, peak gradient 20. Continue ASA and plavix which will be continued at least 1 year after stent placement 06/2017.   Chronic systolic CHF: continue Coreg 3.125mg  BID and lisinopril 2.5mg  daily. EF continues to improve s/p TAVR. EF 40-45% by echo today. Edema improved on lasix 60mg  daily. Will check a BMET today.   Right pleural effusion: pleur-X tube removed on 08/07/17. Site healing well.   CAD: s/p DES to LAD on 9/19. No chest pain. Continue ASA/plavix, statin and BB.   HTN: Bp well controlled today   COPD: stable.    Medication Adjustments/Labs and Tests Ordered: Current medicines are reviewed at length  with the patient today.  Concerns regarding medicines are outlined above.  Medication changes, Labs and Tests ordered today are listed in the Patient Instructions below. Patient Instructions  Medication Instructions:  Your provider recommends that you continue on your current medications as directed. Please refer to the Current Medication list given to you today.    Labwork: TODAY: BMET  Testing/Procedures: None  Follow-Up: Your provider recommends that you schedule a follow-up appointment in [place] with Nell Range, PA. We will call you to arrange this appointment.  Any Other Special Instructions Will Be Listed Below (If Applicable).  If you need a refill on your cardiac medications before your next appointment, please call your pharmacy.      Signed, Edward Form, PA-C  08/16/2017 2:38 PM    [facility] [address], [place], [place]  [address] Phone: [phone]; Fax: [phone]

## 2017-08-15 NOTE — Patient Instructions (Signed)
Medication Instructions:  Your provider recommends that you continue on your current medications as directed. Please refer to the Current Medication list given to you today.    Labwork: TODAY: BMET  Testing/Procedures: None  Follow-Up: Your provider recommends that you schedule a follow-up appointment in [place] with Nell Range, PA. We will call you to arrange this appointment.  Any Other Special Instructions Will Be Listed Below (If Applicable).     If you need a refill on your cardiac medications before your next appointment, please call your pharmacy.

## 2017-08-16 LAB — BASIC METABOLIC PANEL
BUN/Creatinine Ratio: 13 (ref 10–24)
BUN: 12 mg/dL (ref 8–27)
CALCIUM: 9.2 mg/dL (ref 8.6–10.2)
CO2: 25 mmol/L (ref 20–29)
CREATININE: 0.96 mg/dL (ref 0.76–1.27)
Chloride: 101 mmol/L (ref 96–106)
GFR calc non Af Amer: 76 mL/min/{1.73_m2} (ref >59)
GFR, EST AFRICAN AMERICAN: 88 mL/min/{1.73_m2} (ref >59)
GLUCOSE: 102 mg/dL — AB (ref 65–99)
Potassium: 4.6 mmol/L (ref 3.5–5.2)
Sodium: 142 mmol/L (ref 134–144)

## 2017-08-16 NOTE — Patient Outreach (Signed)
[facility]) Care Management  08/16/2017  [facility] 13-Jan-1941 [id number]   [place] met patient on 10/18 at his Cardiology appointment and met with him and his friend, [name], for follow up after SNF dc. Patient agreed to speak with me in the the presence of his friend, [name], who refers to patient "like a father figure".  Patient resides on her property; both patient and [name] are a bit vague and not disclosing a lot about his "home", other than to say;  "he has a bed" and some sort of heater that he doesn't tend to use because he is fearful of it. "He can take a shower at my home but he likes to go the gym and use their showers".  The gym is rec center in [place]. Both patient and [name] deny any concerns, needs or interest in enrollment in [place]. CSW discussed [facility] CM program and services, but patient politely declines. CSW also offered to assist with getting him a free phone via Medicaid, but he persistently states; "I'm gonna get one from [facility]."  CSW provided them with [facility] contact #'s as well as CSW's #. CSW will close referral at this time.     Eduard Clos, MSW, [place] Worker  [place] [phone]

## 2017-08-21 ENCOUNTER — Encounter: Payer: Self-pay | Admitting: Thoracic Surgery (Cardiothoracic Vascular Surgery)

## 2017-08-22 ENCOUNTER — Other Ambulatory Visit (HOSPITAL_COMMUNITY): Payer: Medicare Other

## 2017-08-22 ENCOUNTER — Ambulatory Visit: Payer: Medicare Other | Admitting: Physician Assistant

## 2017-09-03 ENCOUNTER — Telehealth: Payer: Self-pay

## 2017-09-03 NOTE — Telephone Encounter (Signed)
Called to schedule appointment with [place], [place] mid January, 2019.  Left message to call back.

## 2017-09-03 NOTE — Telephone Encounter (Signed)
-----   Message from Theodoro Parma, RN sent at 08/15/2017  4:33 PM EDT ----- Regarding: 3 mo with KT mid January Call and schedule 3 mo OV with KT   [name] - [phone]

## 2017-09-04 NOTE — Telephone Encounter (Signed)
Spoke with [name] (who provides patient's transportation). Scheduled patient 11/13/17 with K. Grandville Silos, [place] for follow-up. She was grateful for call and agrees with treatment plan.

## 2017-10-14 DIAGNOSIS — I10 Essential (primary) hypertension: Secondary | ICD-10-CM | POA: Diagnosis not present

## 2017-10-14 DIAGNOSIS — I509 Heart failure, unspecified: Secondary | ICD-10-CM | POA: Diagnosis not present

## 2017-10-14 DIAGNOSIS — I25118 Atherosclerotic heart disease of native coronary artery with other forms of angina pectoris: Secondary | ICD-10-CM | POA: Diagnosis not present

## 2017-10-14 DIAGNOSIS — E785 Hyperlipidemia, unspecified: Secondary | ICD-10-CM | POA: Diagnosis not present

## 2017-11-12 NOTE — Progress Notes (Signed)
[facility] [place]                                       Cardiology Office Note    Date:  11/13/2017   ID:  Edward Strickland, DOB [birth year], MRN [id number]  PCP:  Jaclynn Major, NP  Cardiologist:  Dr. Burt Knack / Dr. Angelena Form (TAVR)  CC: 3 month follow up   History of Present Illness:  Edward Strickland is a 77 y.o. male with a history of HTN, COPD, chronic systolic CHF, right pleural effusion s/p Pleur-X catheter placement, and severe aortic stenosis s/p TAVR (07/23/17) who presents to clinic for follow up.   The patient was originally diagnosed with severe aortic stenosis back in 2016, but LV function was normal. Review of previous echo report from [place] in 09/2015 showedEF 60-65%, trileaflet aortic valve with AVA 0.44 cm^2, mean gradient 66 mm Hg and peak gradient 89.   He lives alone in [place]. He mostly lives out of his car but has a storage unit where he also stays from time to time. He has a friend named [name] who lives close by and checks in on him. He is divorced and has no kids. He has no other living family.   He was admitted to [facility] from 6/22-6/33/35 for A/C systolic CHF in the setting of severe AS. 2D ECHO showed EF 25-30%, diffuse HK, severe AS, mod biatrial enlargement, moderate RV dilation, PA pressure 79 mm Hg. CXR showed R large pleural effusion that required thoracentesis. L/RHC 9/12 showed 90% mLAD stenosis and he underwent stagedPCI/DESof the LAD on 9/19 after all his remaining teeth were pulled. He underwent successful TAVR with a 37mm Edwards Sapien valve via R TF approach as well as R pleur-X tube placementon 07/23/17. Post op echo showed moderate PVL and EF 35-40%. He was discharged on ASA and plavix, Coreg 3.125mg  BID and lasix 40mg  daily. He was discharged to a SNF given lack of social support and family.   He did quite well after surgery. 30 day post op echo showed a well seated TAVR valve with moderate AI;  mean gradient 10, peak gradient 20. He was significantly improved in terms of his breathing and heart failure. Lisinopril 2.5mg  daily was added.  Today he presents to clinic for regular cardiology follow up. No CP or SOB. No LE edema, orthopnea or PND. No dizziness or syncope. No blood in stool or urine. No palpitations. 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 He is doing great and can tell a huge difference in his stamina and breathing since surgery.    Past Medical History:  Diagnosis Date  . Aortic stenosis, severe   . CAD (coronary artery disease)    a. 06/2017: s/p DES to LAD.   Marland Kitchen Chronic systolic CHF (congestive heart failure) ([place])   . COPD (chronic obstructive pulmonary disease) ([place])   . Hypertension   . S/P TAVR (transcatheter aortic valve replacement) 07/23/2017   29 mm Edwards Sapien 3 transcatheter heart valve placed via percutaneous right transfemoral approach    Past Surgical History:  Procedure Laterality Date  . CHEST TUBE INSERTION Right 07/23/2017   Procedure: INSERTION PLEURAL DRAINAGE CATHETER;  Surgeon: Rexene Alberts, MD;  Location: [place];  Service: Thoracic;  Laterality: Right;  . CORONARY STENT INTERVENTION N/A 07/17/2017   Procedure: CORONARY  STENT INTERVENTION;  Surgeon: Burnell Blanks, MD;  Location: [place] [facility];  Service: Cardiovascular;  Laterality: N/A;  . IR THORACENTESIS ASP PLEURAL SPACE W/IMG GUIDE  07/11/2017  . MULTIPLE EXTRACTIONS WITH ALVEOLOPLASTY N/A 07/15/2017   Procedure: Extraction of tooth #'s 4,6,7,8,9,15, 20,21,22, 23 ,24,26,and 27 with alveoloplassty;  Surgeon: Lenn Cal, DDS;  Location: [place];  Service: Oral Surgery;  Laterality: N/A;  . REMOVAL OF PLEURAL DRAINAGE CATHETER N/A 08/07/2017   Procedure: REMOVAL OF PLEURAL DRAINAGE CATHETER;  Surgeon: Rexene Alberts, MD;  Location: [place];  Service: Thoracic;  Laterality: N/A;  . TEE WITHOUT CARDIOVERSION N/A 07/23/2017   Procedure:  TRANSESOPHAGEAL ECHOCARDIOGRAM (TEE);  Surgeon: Burnell Blanks, MD;  Location: [place];  Service: Open Heart Surgery;  Laterality: N/A;  . TRANSCATHETER AORTIC VALVE REPLACEMENT, TRANSFEMORAL N/A 07/23/2017   Procedure: TRANSCATHETER AORTIC VALVE REPLACEMENT, TRANSFEMORAL;  Surgeon: Burnell Blanks, MD;  Location: [place];  Service: Open Heart Surgery;  Laterality: N/A;    Current Medications: Outpatient Medications Prior to Visit  Medication Sig Dispense Refill  . aspirin EC 81 MG tablet Take 81 mg by mouth daily.    . clopidogrel (PLAVIX) 75 MG tablet Take 1 tablet (75 mg total) by mouth daily. 90 tablet 3  . diphenhydrAMINE (BENADRYL) 25 MG tablet Take 25 mg by mouth every 8 (eight) hours as needed for itching.    . furosemide (LASIX) 40 MG tablet Take 1.5 tablets (60 mg total) by mouth daily. 135 tablet 3  . lisinopril (PRINIVIL,ZESTRIL) 2.5 MG tablet Take 1 tablet (2.5 mg total) by mouth daily. 90 tablet 3  . atorvastatin (LIPITOR) 80 MG tablet Take 1 tablet (80 mg total) by mouth daily at 6 PM. 90 tablet 3  . carvedilol (COREG) 3.125 MG tablet Take 1 tablet (3.125 mg total) by mouth 2 (two) times daily with a meal. 60 tablet 6  . potassium chloride SA (K-DUR,KLOR-CON) 20 MEQ tablet Take 1 tablet (20 mEq total) by mouth daily. 30 tablet 6   No facility-administered medications prior to visit.      Allergies:   Patient has no known allergies.   Social History   Socioeconomic History  . Marital status: Single    Spouse name: None  . Number of children: None  . Years of education: None  . Highest education level: None  Social Needs  . Financial resource strain: None  . Food insecurity - worry: None  . Food insecurity - inability: None  . Transportation needs - medical: None  . Transportation needs - non-medical: None  Occupational History  . None  Tobacco Use  . Smoking status: Former Research scientist (life sciences)  . Smokeless tobacco: Never Used  Substance and Sexual Activity  .  Alcohol use: None  . Drug use: None  . Sexual activity: None  Other Topics Concern  . None  Social History Narrative  . None     Family History:  The patient's family history includes Congestive Heart Failure in his paternal uncle.      ROS:   Please see the history of present illness.    ROS All other systems reviewed and are negative.   PHYSICAL EXAM:   VS:  BP (!) 108/58   Pulse 79   Resp 16   Ht 5\' 6"  (1.676 m)   Wt 159 lb (72.1 kg)   SpO2 98%   BMI 25.66 kg/m    GEN: Well nourished, well developed, in no acute distress  HEENT: normal  Neck: no JVD, carotid bruits, or masses Cardiac: RRR; no murmurs, rubs, or gallops,no edema  Respiratory:  clear to auscultation bilaterally, normal work of breathing GI: soft, nontender, nondistended, + BS MS: no deformity or atrophy  Skin: warm and dry, no rash Neuro:  Alert and Oriented x 3, Strength and sensation are intact Psych: euthymic mood, full affect   Wt Readings from Last 3 Encounters:  11/13/17 159 lb (72.1 kg)  08/15/17 145 lb (65.8 kg)  08/01/17 144 lb 1.9 oz (65.4 kg)      Studies/Labs Reviewed:   EKG:  EKG is NOT ordered today.    Recent Labs: 07/22/2017: ALT 30 07/24/2017: Hemoglobin 10.0; Platelets 218 07/25/2017: Magnesium 2.0 08/15/2017: BUN 12; Creatinine, Ser 0.96; Potassium 4.6; Sodium 142   Lipid Panel No results found for: CHOL, TRIG, HDL, CHOLHDL, VLDL, LDLCALC, LDLDIRECT  Additional studies/ records that were reviewed today include:  2D ECHO: 07/09/2017 LV EF: 25% - 30% Study Conclusion - Left ventricle: The cavity size was mildly dilated. Wall thickness was increased in a pattern of mild LVH. Systolic function was severely reduced. The estimated ejection fraction was in the range of 25% to 30%. Diffuse hypokinesis. - Aortic valve: Valve mobility was restricted. There was severe stenosis. There was mild to moderate regurgitation. Valve area (VTI): 0.41 cm^2. Valve area (Vmax):  0.35 cm^2. Valve area (Vmean): 0.39 cm^2. - Left atrium: The atrium was moderately dilated. - Right ventricle: The cavity size was moderately dilated. Systolic function was moderately reduced. - Right atrium: The atrium was moderately dilated. - Pulmonary arteries: Systolic pressure was severely increased. PA peak pressure: 79 mm Hg (S). - Pericardium, extracardiac: There was a left pleural effusion. Impressions: - Severe global reduction in LV systolic function; mild LVE and LVH; calcified aortic valve with severe AS and mild to moderate AI; moderate LAE; moderate RVE with moderately reduced function; mild TR with severely elevated pulmonary pressure.   07/10/17 RIGHT HEART CATH AND CORONARY ANGIOGRAPHY  Conclusion  1. Severe single-vessel coronary artery disease with severe stenosis in the mid LAD and otherwise mild diffuse nonobstructive calcific coronary disease 2. Known severe aortic stenosis with very heavy calcification of the aortic valve on plain fluoroscopy and severely restricted aortic leaflet mobility 3. Severe pulmonary hypertension likely secondary to left heart disease with severely elevated pulmonary capillary wedge pressure  Recommendation: Continued evaluation by the multidisciplinary heart valve team. Likely proceed with PCI of the LAD followed by TAVR pending cardiac surgical consultation    Cardiac CT 07/12/17 IMPRESSION: 1. Trileaflet aortic valve with heavily calcified leaflets and severe leaflet opening restriction. There are significant asymmetric calcifications extending into the LVOT under the non-coronary leaflet. Annular measurements suitable for delivery of a 29 mm Edwards-SAPIEN valve. 2. Sufficient annulus to coronary distance. 3. Optimum Fluoroscopic Angle for Delivery: LAU 7 CAU 7. 4. No Thrombus in the left atrial appendage. 5. Dilated pulmonary artery measuring 33 x 32 mm suggestive of pulmonary hypertension.   Carotid  doppler 07/14/17 Summary: Bilateral: mild soft plaque CCA. Mild mixed plaque origin and proximal ICA. 1-39% ICA plaquing. vertebral artery flow is antegrade.   CT angio abdomen/pelvis/chest: 07/12/17 IMPRESSION: 1. Vascular findings and measurements pertinent to potential TAVR procedure, as detailed above. This patient does have suitable pelvic arterial access bilaterally. 2. Severe thickening calcification of the aortic valve, compatible with the reported clinical history of severe aortic stenosis. 3. Findings in the thorax suggestive of underlying congestive heart failure, including moderate pulmonary edema, moderate right and small  a moderate left pleural effusions, and cardiomegaly. 4. Aortic atherosclerosis, in addition to left anterior descending coronary artery disease. Assessment for potential risk factor modification, dietary therapy or pharmacologic therapy may be warranted, if clinically indicated. 5. Colonic diverticulosis without evidence to suggest an acute diverticulitis at this time. 6. Additional incidental findings, as above. Aortic Atherosclerosis (ICD10-I70.0).   07/14/17 CORONARY STENT INTERVENTION  Conclusion  1. Severe stenosis mid LAD 2. Successful PTCA/DES x 1 mid LAD Recommendations: Will continue DAPT with ASA and Plavix for at least 3 months but longer if he tolerates. Continue planning for TAVR.     Post operative echo 07/24/17 - Left ventricle: Mid an basal inferior wall akinesis The cavity size was mildly dilated. There was mild concentric hypertrophy. Systolic function was moderately reduced. The estimated ejection fraction was in the range of 35% to 40%. Doppler parameters are consistent with abnormal left ventricular relaxation (grade 1 diastolic dysfunction). - Aortic valve: 29 mm Sapien 3 valve with moderate peri valvular regurgitation. Valve area (VTI): 2.02 cm^2. Valve area (Vmax): 1.64 cm^2. Valve area (Vmean): 1.96  cm^2. - Left atrium: The atrium was mildly dilated. - Atrial septum: No defect or patent foramen ovale was identified. - Pulmonary arteries: PA peak pressure: 41 mm Hg (S).   2D ECHO: 08/13/17 Study Conclusions - Left ventricle: The cavity size was normal. Systolic function was mildly to moderately reduced. The estimated ejection fraction was in the range of 40% to 45%. Mild hypokinesis of the inferior myocardium. Doppler parameters are consistent with abnormal left ventricular relaxation (grade 1 diastolic dysfunction). Doppler parameters are consistent with high ventricular filling pressure. - Aortic valve: A 29 mm Sapien 3 bioprosthesis was present. Transvalvular velocity was within the normal range. There was no stenosis. There was moderate regurgitation. Regurgitation pressure half-time: 464 ms. - Mitral valve: Transvalvular velocity was within the normal range. There was no evidence for stenosis. There was no regurgitation. - Left atrium: The atrium was severely dilated. - Right ventricle: The cavity size was normal. Wall thickness was normal. Systolic function was normal. - Tricuspid valve: There was trivial regurgitation. - Pulmonary arteries: Systolic pressure was within the normal range. PA peak pressure: 29 mm Hg (S).    ASSESSMENT & PLAN:   AS s/p TAVR:doing excellent. Plan to see him back for 1 year follow up with an echo. He is aware of the need for SBE prophylaxis if he gets dentures fit. Continue ASA and plavix.   Chronic systolic CHF: continue Coreg 3.125 mg BID and lisinopril 2.5mg  daily. No s/s CHF. Continue Lasix 60mg  daily. Will check a BMET today.   CAD: s/p DES to LAD on 07/17/17. Continue DAPT for 1 year after stent placement. Continue statin and BB.   HTN: BP well controlled today .   COPD: stable.    Medication Adjustments/Labs and Tests Ordered: Current medicines are reviewed at length with the patient today.  Concerns  regarding medicines are outlined above.  Medication changes, Labs and Tests ordered today are listed in the Patient Instructions below. Patient Instructions  Medication Instructions:  Your provider recommends that you continue on your current medications as directed. Please refer to the Current Medication list given to you today.    Labwork: TODAY: BMET  Testing/Procedures: Your provider has requested that you have an echocardiogram in August, 2019. Echocardiography is a painless test that uses sound waves to create images of your heart. It provides your doctor with information about the size and shape of your heart and  how well your heart's chambers and valves are working. This procedure takes approximately one hour. There are no restrictions for this procedure.  Follow-Up: Your provider wants you to follow-up in: August, 2019 with Nell Range, [place]. You will receive a reminder letter in the mail two months in advance. If you don't receive a letter, please call our office to schedule the follow-up appointment.    Any Other Special Instructions Will Be Listed Below (If Applicable).     If you need a refill on your cardiac medications before your next appointment, please call your pharmacy.      Signed, Angelena Form, PA-C  11/13/2017 2:06 PM    [facility] [place], [place], [place]  [address] Phone: [phone]; Fax: [phone]

## 2017-11-13 ENCOUNTER — Ambulatory Visit (INDEPENDENT_AMBULATORY_CARE_PROVIDER_SITE_OTHER): Payer: Medicare Other | Admitting: Physician Assistant

## 2017-11-13 ENCOUNTER — Encounter: Payer: Self-pay | Admitting: Physician Assistant

## 2017-11-13 VITALS — BP 108/58 | HR 79 | Resp 16 | Ht 66.0 in | Wt 159.0 lb

## 2017-11-13 DIAGNOSIS — I2510 Atherosclerotic heart disease of native coronary artery without angina pectoris: Secondary | ICD-10-CM | POA: Diagnosis not present

## 2017-11-13 DIAGNOSIS — J449 Chronic obstructive pulmonary disease, unspecified: Secondary | ICD-10-CM

## 2017-11-13 DIAGNOSIS — I5022 Chronic systolic (congestive) heart failure: Secondary | ICD-10-CM | POA: Diagnosis not present

## 2017-11-13 DIAGNOSIS — I10 Essential (primary) hypertension: Secondary | ICD-10-CM

## 2017-11-13 DIAGNOSIS — Z952 Presence of prosthetic heart valve: Secondary | ICD-10-CM | POA: Diagnosis not present

## 2017-11-13 MED ORDER — ATORVASTATIN CALCIUM 80 MG PO TABS: 80.0000 mg | ORAL_TABLET | Freq: Every day | ORAL | 3 refills | Status: DC

## 2017-11-13 MED ORDER — CARVEDILOL 3.125 MG PO TABS: 3.1250 mg | ORAL_TABLET | Freq: Two times a day (BID) | ORAL | 3 refills | Status: DC

## 2017-11-13 MED ORDER — POTASSIUM CHLORIDE CRYS ER 20 MEQ PO TBCR: 20.0000 meq | EXTENDED_RELEASE_TABLET | Freq: Every day | ORAL | 3 refills | Status: DC

## 2017-11-13 NOTE — Patient Instructions (Signed)
Medication Instructions:  Your provider recommends that you continue on your current medications as directed. Please refer to the Current Medication list given to you today.    Labwork: TODAY: BMET  Testing/Procedures: Your provider has requested that you have an echocardiogram in August, 2019. Echocardiography is a painless test that uses sound waves to create images of your heart. It provides your doctor with information about the size and shape of your heart and how well your heart's chambers and valves are working. This procedure takes approximately one hour. There are no restrictions for this procedure.  Follow-Up: Your provider wants you to follow-up in: August, 2019 with Edward Strickland, [place]. You will receive a reminder letter in the mail two months in advance. If you don't receive a letter, please call our office to schedule the follow-up appointment.    Any Other Special Instructions Will Be Listed Below (If Applicable).     If you need a refill on your cardiac medications before your next appointment, please call your pharmacy.

## 2017-11-14 LAB — BASIC METABOLIC PANEL
BUN/Creatinine Ratio: 16 (ref 10–24)
BUN: 20 mg/dL (ref 8–27)
CALCIUM: 9.2 mg/dL (ref 8.6–10.2)
CO2: 25 mmol/L (ref 20–29)
CREATININE: 1.29 mg/dL — AB (ref 0.76–1.27)
Chloride: 102 mmol/L (ref 96–106)
GFR, EST AFRICAN AMERICAN: 62 mL/min/{1.73_m2} (ref >59)
GFR, EST NON AFRICAN AMERICAN: 53 mL/min/{1.73_m2} — AB (ref >59)
Glucose: 101 mg/dL — ABNORMAL HIGH (ref 65–99)
POTASSIUM: 4.3 mmol/L (ref 3.5–5.2)
Sodium: 141 mmol/L (ref 134–144)

## 2018-01-10 DIAGNOSIS — L309 Dermatitis, unspecified: Secondary | ICD-10-CM | POA: Diagnosis not present

## 2018-01-10 DIAGNOSIS — I5022 Chronic systolic (congestive) heart failure: Secondary | ICD-10-CM | POA: Diagnosis not present

## 2018-01-10 DIAGNOSIS — I25118 Atherosclerotic heart disease of native coronary artery with other forms of angina pectoris: Secondary | ICD-10-CM | POA: Diagnosis not present

## 2018-01-10 DIAGNOSIS — I10 Essential (primary) hypertension: Secondary | ICD-10-CM | POA: Diagnosis not present

## 2018-01-10 DIAGNOSIS — E785 Hyperlipidemia, unspecified: Secondary | ICD-10-CM | POA: Diagnosis not present

## 2018-01-17 DIAGNOSIS — I10 Essential (primary) hypertension: Secondary | ICD-10-CM | POA: Diagnosis not present

## 2018-02-12 DIAGNOSIS — Z6826 Body mass index (BMI) 26.0-26.9, adult: Secondary | ICD-10-CM | POA: Diagnosis not present

## 2018-02-12 DIAGNOSIS — I509 Heart failure, unspecified: Secondary | ICD-10-CM | POA: Diagnosis not present

## 2018-02-12 DIAGNOSIS — J449 Chronic obstructive pulmonary disease, unspecified: Secondary | ICD-10-CM | POA: Diagnosis not present

## 2018-02-12 DIAGNOSIS — I10 Essential (primary) hypertension: Secondary | ICD-10-CM | POA: Diagnosis not present

## 2018-02-12 DIAGNOSIS — E785 Hyperlipidemia, unspecified: Secondary | ICD-10-CM | POA: Diagnosis not present

## 2018-02-12 DIAGNOSIS — N400 Enlarged prostate without lower urinary tract symptoms: Secondary | ICD-10-CM | POA: Diagnosis not present

## 2018-04-02 ENCOUNTER — Telehealth: Payer: Self-pay | Admitting: Physician Assistant

## 2018-04-02 ENCOUNTER — Telehealth: Payer: Self-pay

## 2018-04-02 NOTE — Telephone Encounter (Signed)
   [facility] Pre-operative Risk Assessment    Request for surgical clearance:  1. What type of surgery is being performed? Dental care: extractions, fillings,xrays,prophylaxis,local anesthesia  2. When is this surgery scheduled? TBD  3. What type of clearance is required (medical clearance vs. Pharmacy clearance to hold med vs. Both)? Medical And pharmacy  4. Are there any medications that need to be held prior to surgery and how long?  5. Practice name and name of physician performing surgery? [facility]/ Verdia Kuba DDS   6. What is your office phone number [phone]   7.   What is your office fax number        [phone]  8.   Anesthesia type (None, local, MAC, general) ? Local    Celene Squibb Cassidy Tashiro 04/02/2018, 4:37 PM  _________________________________________________________________   (provider comments below)

## 2018-04-02 NOTE — Telephone Encounter (Signed)
New Message       [facility] Pre-operative Risk Assessment    Request for surgical clearance:  1. What type of surgery is being performed? Comprehensive Exam   2. When is this surgery scheduled?    3. What type of clearance is required (medical clearance vs. Pharmacy clearance to hold med vs. Both)?   4. Are there any medications that need to be held prior to surgery and how long?   5. Practice name and name of physician performing surgery?   6. What is your office phone numbe   7.   What is your office fax number  8.   Anesthesia type (None, local, MAC, general) ?    Avaletta L Williams 04/02/2018, 12:52 PM  _________________________________________________________________   (provider comments below)

## 2018-04-03 NOTE — Telephone Encounter (Signed)
   Primary Cardiologist: Dr. Burt Knack / Dr. Angelena Form (TAVR)  Chart reviewed as part of pre-operative protocol coverage. /Pt has h/o HTN, COPD, chronic systolic CHF, CAD s/p PCI 06/2017, right pleural effusion s/p Pleur-X catheter placement, and severe aortic stenosis s/p TAVR 06/2017. Notes indicate pt lives out of his car and occasionally in storage unit.   Dental extractions are considered low risk procedures per guidelines and generally do not require any specific cardiac clearance. It is also generally accepted that for simple extractions, exams and cleanings, there is no need to interrupt blood thinner therapy.   However, patient will required antibiotic prophylaxis 30-60 minutes before all dental cleanings and procedures. I will forward this message to our pre-op nursing team to confirm no allergies - if that's the case, will have them send in amoxicillin 500mg  - free text - take 4 capsules/tablets by mouth (2000mg  total) 30-60 minutes prior to dental procedures, disp #4 with 1 refill to pt's preferred pharmacy.  Will otherwise route this bundled recommendation to requesting provider via Epic fax function. Please call with questions.  Charlie Pitter, PA-C 04/03/2018, 3:33 PM

## 2018-04-03 NOTE — Telephone Encounter (Signed)
Called pt re: surgical clearance and the protocol for him to take antibiotics prophylaxis before any type of dental procedures. Spoke with his DPR, [name], and she will have the pt call us back when she sees him, that he is homeless, and stays in and out of his car a lot.

## 2018-04-04 NOTE — Telephone Encounter (Signed)
Tried to call the dental office to make sure they are aware the patient will need antibiotics, the office is closed on Friday.

## 2018-04-09 NOTE — Telephone Encounter (Signed)
Spoke with pts wife, [name], [place] on file, she has been reminded that pt needs to call back for instructions before he can go to his dentist apt. She stated she has told pt and she will tell him again.

## 2018-04-11 MED ORDER — AMOXICILLIN 500 MG PO CAPS: 2000.0000 mg | ORAL_CAPSULE | Freq: Once | ORAL | 0 refills | Status: AC

## 2018-04-11 NOTE — Telephone Encounter (Signed)
Pt returning call for instructions for clearance

## 2018-04-11 NOTE — Telephone Encounter (Signed)
Pt is homeless,  Have s/w pt's wife who says pt has gone to see some cousins and will not be back until Monday at least. I advised pt will need Antibiotic for dental work. Amoxicillin 500 mg cap will be called in to [facility] on [address]. I went over the instructions with pt's wife as well as verified pt is not allergic to Penicillin. Advised pt will need to take 4 capsules = 2000 mg 30-60 minutes before the dental work. Pt's wife thanked me for our help.

## 2018-04-11 NOTE — Telephone Encounter (Signed)
   Primary Cardiologist: Sherren Mocha, MD  Please call patient.  Please see recommendations from [facility], PA-C.  The patient needs a Rx for Amoxicillin to take 30-60 minutes prior to his dental procedure.  Rx needs to be either sent to a pharmacy or paper Rx picked up by patient.  If patient allergic to penicillin, let me know.  Richardson Dopp, PA-C 04/11/2018, 10:35 AM

## 2018-04-15 DIAGNOSIS — I2510 Atherosclerotic heart disease of native coronary artery without angina pectoris: Secondary | ICD-10-CM | POA: Diagnosis not present

## 2018-04-15 DIAGNOSIS — I5022 Chronic systolic (congestive) heart failure: Secondary | ICD-10-CM | POA: Diagnosis not present

## 2018-04-15 DIAGNOSIS — I10 Essential (primary) hypertension: Secondary | ICD-10-CM | POA: Diagnosis not present

## 2018-04-15 DIAGNOSIS — E785 Hyperlipidemia, unspecified: Secondary | ICD-10-CM | POA: Diagnosis not present

## 2018-05-20 ENCOUNTER — Other Ambulatory Visit: Payer: Self-pay

## 2018-05-20 DIAGNOSIS — Z952 Presence of prosthetic heart valve: Secondary | ICD-10-CM

## 2018-05-20 DIAGNOSIS — I350 Nonrheumatic aortic (valve) stenosis: Secondary | ICD-10-CM

## 2018-05-29 ENCOUNTER — Other Ambulatory Visit: Payer: Self-pay

## 2018-06-17 ENCOUNTER — Encounter: Payer: Self-pay | Admitting: Physician Assistant

## 2018-07-07 NOTE — Progress Notes (Signed)
[facility]                                       Cardiology Office Note    Date:  07/10/2018   ID:  Edward Strickland, DOB Jan 08, 1941, MRN [id number]  PCP:  Jaclynn Major, NP  Cardiologist: Dr. Burt Knack / Dr. Angelena Form & Dr. Roxy Manns (TAVR)  CC: 1 year s/p TAVR  History of Present Illness:  Edward Strickland is a 77 y.o. male with a history of HTN, COPD, chronic systolic CHF, right pleural effusion s/p Pleur-X catheter placement, and severe aortic stenosis s/p TAVR (07/23/17) who presents to clinic for follow up.   The patient was originally diagnosed with severe aortic stenosis back in 2016, but LV function was normal. Review of previous echo report from [place] in 09/2015 showedEF 60-65%, trileaflet aortic valve with AVA 0.44 cm^2, mean gradient 66 mm Hg and peak gradient 89.   He lives alone in [place]. He mostly lives out of his car but has a storage unit where he also stays from time to time. He has a friend named [name] who lives close by and checks in on him. He is divorced and has no kids. He has no other living family.   He was admitted to [facility] from 6/16-0/73/71 for A/C systolic CHF in the setting of severe AS. 2D ECHO showed EF 25-30%, diffuse HK, severe AS, mod biatrial enlargement, moderate RV dilation, PA pressure 79 mm Hg. CXR showed R large pleural effusion that required thoracentesis. L/RHC 9/12 showed 90% mLAD stenosis and he underwent stagedPCI/DESof the LAD on 9/19 after all his remaining teeth were pulled. He underwent successful TAVR with a 42mm Edwards Sapien valve via R TF approach as well as R pleur-X tube placementon 07/23/17. Post op echo showed moderate PVL and EF 35-40%. He was discharged on ASA and plavix, Coreg 3.125mg  BID and lasix 40mg  daily. He was discharged to a SNF given lack of social support and family.   He did quite well after surgery. 30 day post op echo showed EF had improved to 40-45% with a well  seated TAVR valve with moderate AI; mean gradient 10, peak gradient 20. He was significantly improved in terms of his breathing and heart failure. Lisinopril 2.5mg  daily was added.  Since that time he has been seen by his PCP and started on Entresto.   Today he presents to clinic for follow up. No CP or SOB. No LE edema, orthopnea or PND. No dizziness or syncope. No blood in stool or urine. No palpitations. He feels great and repeats over and over that he is "happy as a lark." He has been out "piddle paddling around" and doesn't push it. He walks with a cane.     Past Medical History:  Diagnosis Date  . Aortic stenosis, severe   . CAD (coronary artery disease)    a. 06/2017: s/p DES to LAD.   Marland Kitchen Chronic systolic CHF (congestive heart failure) ([place])   . COPD (chronic obstructive pulmonary disease) ([place])   . Hypertension   . S/P TAVR (transcatheter aortic valve replacement) 07/23/2017   29 mm Edwards Sapien 3 transcatheter heart valve placed via percutaneous right transfemoral approach    Past Surgical History:  Procedure Laterality Date  . CHEST TUBE INSERTION Right 07/23/2017   Procedure: INSERTION PLEURAL DRAINAGE CATHETER;  Surgeon: Darylene Price  H, MD;  Location: [facility];  Service: Thoracic;  Laterality: Right;  . CORONARY STENT INTERVENTION N/A 07/17/2017   Procedure: CORONARY STENT INTERVENTION;  Surgeon: Burnell Blanks, MD;  Location: [place] [facility];  Service: Cardiovascular;  Laterality: N/A;  . IR THORACENTESIS ASP PLEURAL SPACE W/IMG GUIDE  07/11/2017  . MULTIPLE EXTRACTIONS WITH ALVEOLOPLASTY N/A 07/15/2017   Procedure: Extraction of tooth #'s 4,6,7,8,9,15, 20,21,22, 23 ,24,26,and 27 with alveoloplassty;  Surgeon: Lenn Cal, DDS;  Location: [facility];  Service: Oral Surgery;  Laterality: N/A;  . REMOVAL OF PLEURAL DRAINAGE CATHETER N/A 08/07/2017   Procedure: REMOVAL OF PLEURAL DRAINAGE CATHETER;  Surgeon: Rexene Alberts, MD;  Location: [place];  Service: Thoracic;   Laterality: N/A;  . TEE WITHOUT CARDIOVERSION N/A 07/23/2017   Procedure: TRANSESOPHAGEAL ECHOCARDIOGRAM (TEE);  Surgeon: Burnell Blanks, MD;  Location: [facility];  Service: Open Heart Surgery;  Laterality: N/A;  . TRANSCATHETER AORTIC VALVE REPLACEMENT, TRANSFEMORAL N/A 07/23/2017   Procedure: TRANSCATHETER AORTIC VALVE REPLACEMENT, TRANSFEMORAL;  Surgeon: Burnell Blanks, MD;  Location: [facility];  Service: Open Heart Surgery;  Laterality: N/A;    Current Medications: Outpatient Medications Prior to Visit  Medication Sig Dispense Refill  . aspirin EC 81 MG tablet Take 81 mg by mouth daily.    Marland Kitchen atorvastatin (LIPITOR) 80 MG tablet Take 1 tablet (80 mg total) by mouth daily at 6 PM. 90 tablet 3  . carvedilol (COREG) 3.125 MG tablet Take 1 tablet (3.125 mg total) by mouth 2 (two) times daily with a meal. 180 tablet 3  . diphenhydrAMINE (BENADRYL) 25 MG tablet Take 25 mg by mouth every 8 (eight) hours as needed for itching.    Marland Kitchen lisinopril (PRINIVIL,ZESTRIL) 2.5 MG tablet Take 1 tablet (2.5 mg total) by mouth daily. 90 tablet 3  . clopidogrel (PLAVIX) 75 MG tablet Take 1 tablet (75 mg total) by mouth daily. 90 tablet 3  . furosemide (LASIX) 40 MG tablet Take 1.5 tablets (60 mg total) by mouth daily. 135 tablet 3  . ENTRESTO 24-26 MG Take 1 tablet by mouth 2 (two) times daily.  3  . potassium chloride SA (K-DUR,KLOR-CON) 20 MEQ tablet Take 1 tablet (20 mEq total) by mouth daily. (Patient not taking: Reported on 07/09/2018) 90 tablet 3   No facility-administered medications prior to visit.      Allergies:   Patient has no known allergies.   Social History   Socioeconomic History  . Marital status: Single    Spouse name: Not on file  . Number of children: Not on file  . Years of education: Not on file  . Highest education level: Not on file  Occupational History  . Not on file  Social Needs  . Financial resource strain: Not on file  . Food insecurity:    Worry: Not on file     Inability: Not on file  . Transportation needs:    Medical: Not on file    Non-medical: Not on file  Tobacco Use  . Smoking status: Former Research scientist (life sciences)  . Smokeless tobacco: Never Used  Substance and Sexual Activity  . Alcohol use: Never    Frequency: Never  . Drug use: Never  . Sexual activity: Not on file  Lifestyle  . Physical activity:    Days per week: Not on file    Minutes per session: Not on file  . Stress: Not on file  Relationships  . Social connections:    Talks on phone: Not on  file    Gets together: Not on file    Attends religious service: Not on file    Active member of club or organization: Not on file    Attends meetings of clubs or organizations: Not on file    Relationship status: Not on file  Other Topics Concern  . Not on file  Social History Narrative  . Not on file     Family History:  The patient's family history includes Congestive Heart Failure in his paternal uncle.     ROS:   Please see the history of present illness.    ROS All other systems reviewed and are negative.   PHYSICAL EXAM:   VS:  BP (!) 142/58   Pulse (!) 57   Ht 5\' 6"  (1.676 m)   Wt 165 lb (74.8 kg)   SpO2 97%   BMI 26.63 kg/m   GEN: Well nourished, well developed, in no acute distress  HEENT: normal  Neck: no JVD, carotid bruits, or masses Cardiac: RRR; no murmurs, rubs, or gallops,no edema  Respiratory:  clear to auscultation bilaterally, normal work of breathing GI: soft, nontender, nondistended, + BS MS: no deformity or atrophy  Skin: warm and dry, no rash Neuro:  Alert and Oriented x 3, Strength and sensation are intact Psych: euthymic mood, full affect   Wt Readings from Last 3 Encounters:  07/09/18 165 lb (74.8 kg)  11/13/17 159 lb (72.1 kg)  08/15/17 145 lb (65.8 kg)      Studies/Labs Reviewed:   EKG:  EKG is NOT ordered today.    Recent Labs: 07/22/2017: ALT 30 07/24/2017: Hemoglobin 10.0; Platelets 218 07/25/2017: Magnesium 2.0 11/13/2017: BUN 20;  Creatinine, Ser 1.29; Potassium 4.3; Sodium 141   Lipid Panel No results found for: CHOL, TRIG, HDL, CHOLHDL, VLDL, LDLCALC, LDLDIRECT  Additional studies/ records that were reviewed today include:  TAVR OPERATIVE NOTE   Date of Procedure:                07/23/2017  Preoperative Diagnosis:        Severe Aortic Stenosis   Recurrent Right Pleural Effusion  Postoperative Diagnosis:    Same   Procedure:        Transcatheter Aortic Valve Replacement - Percutaneous Right Transfemoral Approach             Edwards Sapien 3 THV (size 29 mm, model # 9600TFX, serial # [id number])   Placement of Right Pleur-X Catheter               Co-Surgeons:                        Clarence H. Roxy Manns, MD and Lauree Chandler, MD  Anesthesiologist:                  Rica Koyanagi, MD  Echocardiographer:              Jenkins Rouge, MD  Pre-operative Echo Findings: ? Severe aortic stenosis ? Moderate aortic insufficiency ? Severe left ventricular systolic dysfunction  Post-operative Echo Findings: ? Moderate paravalvular leak ? Unchanged left ventricular systolic function  _______________   2D ECHO: 08/13/17 Study Conclusions - Left ventricle: The cavity size was normal. Systolic function was mildly to moderately reduced. The estimated ejection fraction was in the range of 40% to 45%. Mild hypokinesis of the inferior myocardium. Doppler parameters are consistent with abnormal left ventricular relaxation (grade 1 diastolic dysfunction). Doppler parameters are consistent with  high ventricular filling pressure. - Aortic valve: A 29 mm Sapien 3 bioprosthesis was present. Transvalvular velocity was within the normal range. There was no stenosis. There was moderate regurgitation. Regurgitation pressure half-time: 464 ms. - Mitral valve: Transvalvular velocity was within the normal range. There was no evidence for stenosis. There was no regurgitation. - Left atrium:  The atrium was severely dilated. - Right ventricle: The cavity size was normal. Wall thickness was normal. Systolic function was normal. - Tricuspid valve: There was trivial regurgitation. - Pulmonary arteries: Systolic pressure was within the normal range. PA peak pressure: 29 mm Hg (S).  _______________   2D ECHO: 07/09/18 (1 year s/p TAVR) Study Conclusions - Left ventricle: The cavity size was normal. Wall thickness was   increased in a pattern of mild LVH. Systolic function was normal.   The estimated ejection fraction was in the range of 55% to 60%.   Features are consistent with a pseudonormal left ventricular   filling pattern, with concomitant abnormal relaxation and   increased filling pressure (grade 2 diastolic dysfunction). - Aortic valve: A bioprosthesis was present. There was mild   regurgitation. - Mitral valve: Mildly calcified annulus.   Aortic valve                             Value          Reference  Aortic valve peak velocity, S            209   cm/s     ----------  Aortic valve mean velocity, S            142   cm/s     ----------  Aortic valve VTI, S                      51    cm       ----------  Aortic mean gradient, S                  10    mm Hg    ----------  Aortic peak gradient, S                  17    mm Hg    ----------  VTI ratio, LVOT/AV                       0.65           ----------  Aortic valve area, VTI                   2.26  cm^2     ----------  Aortic valve area/bsa, VTI               1.23  cm^2/m^2 ----------  Velocity ratio, peak, LVOT/AV            0.54           ----------  Aortic valve area, peak velocity         1.85  cm^2     ----------  Aortic valve area/bsa, peak              1     cm^2/m^2 ----------  velocity  Velocity ratio, mean, LVOT/AV            0.64           ----------  Aortic valve area,  mean velocity         2.21  cm^2     ----------  Aortic valve area/bsa, mean              1.2   cm^2/m^2 ----------  velocity   Aortic regurg pressure half-time         545   ms       ----------  ASSESSMENT & PLAN:   Severe AS s/p TAVR:doing great. 2D ECHO today shows EF 55-60%, normally functioning TAVR with mean gradient of 52mm Hg and mild regurgiatation. He has NYHA class I symptoms. He is edentulous and does not see the dentist with regards to SBE prophylaxis. He will continue on baby aspirin and plavix will be stopped at this time.  Chronic systolic CHF: EF prior to TAVR 25-30%. Echo today shows totally normalized EF. His PCP, Dr. Marlene Lard, put him on Entresto 24-26mg ; however, he is still taking Lisinopril 2.5 mg daily. I have asked him to stop taking this since he is also taking Entresto. Continue Coreg 3.125mg  BID, which I will increase to 6.25mg  mg BID given borderline high BP today. Continue Lasix 40 mg daily.   CAD: s/p DES to LAD on 07/17/17. Continue ASA, statin and BB. Plavix will be discontinued tioday.  HTN: BP borderline controlled today. Since we are stopping Lisinopril (also on Entresto), I will increase Coreg to 6.25mg  BID. HR 57 but he should be okay with Coreg 6.25mg  BID. He see's PCP next week.   COPD: stable  MED CHANGES:  STOP PLAVIX, STOP LISINOPRIL AND INCREASE COREG FROM 3.125mg  BID to 6.25mg  BID. This note will be faxed to his PCP who is seeing him next week.   Medication Adjustments/Labs and Tests Ordered: Current medicines are reviewed at length with the patient today.  Concerns regarding medicines are outlined above.  Medication changes, Labs and Tests ordered today are listed in the Patient Instructions below. Patient Instructions  Medication Instructions:  See above  If you need a refill on your cardiac medications before your next appointment, please call your pharmacy.  Labwork: NONE ORDERED  TODAY    Testing/Procedures: NONE ORDERED  TODAY    Follow-Up: Your physician wants you to follow-up in: [place] will receive a reminder letter in the mail two  months in advance. If you don't receive a letter, please call our office to schedule the follow-up appointment.     Any Other Special Instructions Will Be Listed Below (If Applicable).                                                                                                                                            Signed, Angelena Form, PA-C  07/10/2018 9:40 AM    [facility] [place], [place] [employer] [place], [place]  [address] Phone: [phone]; Fax: [phone]

## 2018-07-09 ENCOUNTER — Ambulatory Visit (INDEPENDENT_AMBULATORY_CARE_PROVIDER_SITE_OTHER): Payer: Medicare Other | Admitting: Physician Assistant

## 2018-07-09 ENCOUNTER — Ambulatory Visit (HOSPITAL_COMMUNITY): Payer: Medicare Other | Attending: Cardiovascular Disease

## 2018-07-09 ENCOUNTER — Other Ambulatory Visit: Payer: Self-pay

## 2018-07-09 VITALS — BP 142/58 | HR 57 | Ht 66.0 in | Wt 165.0 lb

## 2018-07-09 DIAGNOSIS — I5022 Chronic systolic (congestive) heart failure: Secondary | ICD-10-CM | POA: Diagnosis not present

## 2018-07-09 DIAGNOSIS — Z952 Presence of prosthetic heart valve: Secondary | ICD-10-CM | POA: Insufficient documentation

## 2018-07-09 DIAGNOSIS — I10 Essential (primary) hypertension: Secondary | ICD-10-CM

## 2018-07-09 DIAGNOSIS — I351 Nonrheumatic aortic (valve) insufficiency: Secondary | ICD-10-CM | POA: Insufficient documentation

## 2018-07-09 DIAGNOSIS — I350 Nonrheumatic aortic (valve) stenosis: Secondary | ICD-10-CM

## 2018-07-09 DIAGNOSIS — Z48812 Encounter for surgical aftercare following surgery on the circulatory system: Secondary | ICD-10-CM | POA: Insufficient documentation

## 2018-07-09 DIAGNOSIS — J449 Chronic obstructive pulmonary disease, unspecified: Secondary | ICD-10-CM

## 2018-07-09 DIAGNOSIS — I2510 Atherosclerotic heart disease of native coronary artery without angina pectoris: Secondary | ICD-10-CM | POA: Diagnosis not present

## 2018-07-09 MED ORDER — FUROSEMIDE 40 MG PO TABS: 40.0000 mg | ORAL_TABLET | Freq: Every day | ORAL | 4 refills | Status: AC

## 2018-07-09 NOTE — Patient Instructions (Signed)
Medication Instructions:   STOP TAKING PLAVIX   START TAKING LASIX 40 MG ONCE A DAY   If you need a refill on your cardiac medications before your next appointment, please call your pharmacy.  Labwork: NONE ORDERED  TODAY    Testing/Procedures: NONE ORDERED  TODAY    Follow-Up: Your physician wants you to follow-up in: [place] will receive a reminder letter in the mail two months in advance. If you don't receive a letter, please call our office to schedule the follow-up appointment.     Any Other Special Instructions Will Be Listed Below (If Applicable).                                                                                                                                                 '

## 2018-07-10 ENCOUNTER — Other Ambulatory Visit: Payer: Self-pay | Admitting: Physician Assistant

## 2018-07-10 MED ORDER — CARVEDILOL 6.25 MG PO TABS: 6.2500 mg | ORAL_TABLET | Freq: Two times a day (BID) | ORAL | 1 refills | Status: DC

## 2018-07-16 DIAGNOSIS — I2510 Atherosclerotic heart disease of native coronary artery without angina pectoris: Secondary | ICD-10-CM | POA: Diagnosis not present

## 2018-07-16 DIAGNOSIS — Z23 Encounter for immunization: Secondary | ICD-10-CM | POA: Diagnosis not present

## 2018-07-16 DIAGNOSIS — E782 Mixed hyperlipidemia: Secondary | ICD-10-CM | POA: Diagnosis not present

## 2018-07-16 DIAGNOSIS — I10 Essential (primary) hypertension: Secondary | ICD-10-CM | POA: Diagnosis not present

## 2018-07-16 DIAGNOSIS — I5022 Chronic systolic (congestive) heart failure: Secondary | ICD-10-CM | POA: Diagnosis not present

## 2018-07-21 ENCOUNTER — Encounter: Payer: Self-pay | Admitting: Thoracic Surgery (Cardiothoracic Vascular Surgery)

## 2018-10-16 DIAGNOSIS — I2510 Atherosclerotic heart disease of native coronary artery without angina pectoris: Secondary | ICD-10-CM | POA: Diagnosis not present

## 2018-10-16 DIAGNOSIS — I5022 Chronic systolic (congestive) heart failure: Secondary | ICD-10-CM | POA: Diagnosis not present

## 2018-10-16 DIAGNOSIS — E782 Mixed hyperlipidemia: Secondary | ICD-10-CM | POA: Diagnosis not present

## 2018-10-16 DIAGNOSIS — I10 Essential (primary) hypertension: Secondary | ICD-10-CM | POA: Diagnosis not present

## 2018-11-17 DIAGNOSIS — I2510 Atherosclerotic heart disease of native coronary artery without angina pectoris: Secondary | ICD-10-CM | POA: Diagnosis not present

## 2018-11-17 DIAGNOSIS — I5022 Chronic systolic (congestive) heart failure: Secondary | ICD-10-CM | POA: Diagnosis not present

## 2018-11-17 DIAGNOSIS — I10 Essential (primary) hypertension: Secondary | ICD-10-CM | POA: Diagnosis not present

## 2018-11-17 DIAGNOSIS — E782 Mixed hyperlipidemia: Secondary | ICD-10-CM | POA: Diagnosis not present

## 2018-12-12 ENCOUNTER — Encounter (HOSPITAL_COMMUNITY): Payer: Self-pay

## 2018-12-12 ENCOUNTER — Inpatient Hospital Stay (HOSPITAL_COMMUNITY): Payer: Medicare Other

## 2018-12-12 ENCOUNTER — Emergency Department (HOSPITAL_COMMUNITY): Payer: Medicare Other

## 2018-12-12 ENCOUNTER — Other Ambulatory Visit: Payer: Self-pay

## 2018-12-12 ENCOUNTER — Inpatient Hospital Stay (HOSPITAL_COMMUNITY)
Admission: EM | Admit: 2018-12-12 | Discharge: 2018-12-16 | DRG: 064 | Disposition: A | Discharge status: Skilled Nursing Facility | Payer: Medicare Other | Attending: Neurology | Admitting: Neurology

## 2018-12-12 DIAGNOSIS — Z7401 Bed confinement status: Secondary | ICD-10-CM | POA: Diagnosis not present

## 2018-12-12 DIAGNOSIS — Z87891 Personal history of nicotine dependence: Secondary | ICD-10-CM

## 2018-12-12 DIAGNOSIS — I249 Acute ischemic heart disease, unspecified: Secondary | ICD-10-CM | POA: Diagnosis not present

## 2018-12-12 DIAGNOSIS — E785 Hyperlipidemia, unspecified: Secondary | ICD-10-CM | POA: Diagnosis present

## 2018-12-12 DIAGNOSIS — R51 Headache: Secondary | ICD-10-CM | POA: Diagnosis not present

## 2018-12-12 DIAGNOSIS — G936 Cerebral edema: Secondary | ICD-10-CM | POA: Diagnosis present

## 2018-12-12 DIAGNOSIS — R414 Neurologic neglect syndrome: Secondary | ICD-10-CM | POA: Diagnosis present

## 2018-12-12 DIAGNOSIS — Z8249 Family history of ischemic heart disease and other diseases of the circulatory system: Secondary | ICD-10-CM

## 2018-12-12 DIAGNOSIS — I2510 Atherosclerotic heart disease of native coronary artery without angina pectoris: Secondary | ICD-10-CM | POA: Diagnosis present

## 2018-12-12 DIAGNOSIS — S062X0A Diffuse traumatic brain injury without loss of consciousness, initial encounter: Secondary | ICD-10-CM | POA: Diagnosis not present

## 2018-12-12 DIAGNOSIS — I350 Nonrheumatic aortic (valve) stenosis: Secondary | ICD-10-CM | POA: Diagnosis not present

## 2018-12-12 DIAGNOSIS — J449 Chronic obstructive pulmonary disease, unspecified: Secondary | ICD-10-CM | POA: Diagnosis present

## 2018-12-12 DIAGNOSIS — I610 Nontraumatic intracerebral hemorrhage in hemisphere, subcortical: Secondary | ICD-10-CM | POA: Diagnosis not present

## 2018-12-12 DIAGNOSIS — Z953 Presence of xenogenic heart valve: Secondary | ICD-10-CM | POA: Diagnosis not present

## 2018-12-12 DIAGNOSIS — Z79899 Other long term (current) drug therapy: Secondary | ICD-10-CM

## 2018-12-12 DIAGNOSIS — I629 Nontraumatic intracranial hemorrhage, unspecified: Secondary | ICD-10-CM

## 2018-12-12 DIAGNOSIS — I619 Nontraumatic intracerebral hemorrhage, unspecified: Secondary | ICD-10-CM | POA: Diagnosis present

## 2018-12-12 DIAGNOSIS — I5022 Chronic systolic (congestive) heart failure: Secondary | ICD-10-CM | POA: Diagnosis present

## 2018-12-12 DIAGNOSIS — R4781 Slurred speech: Secondary | ICD-10-CM | POA: Diagnosis present

## 2018-12-12 DIAGNOSIS — I110 Hypertensive heart disease with heart failure: Secondary | ICD-10-CM | POA: Diagnosis present

## 2018-12-12 DIAGNOSIS — N179 Acute kidney failure, unspecified: Secondary | ICD-10-CM | POA: Diagnosis not present

## 2018-12-12 DIAGNOSIS — G8194 Hemiplegia, unspecified affecting left nondominant side: Secondary | ICD-10-CM | POA: Diagnosis present

## 2018-12-12 DIAGNOSIS — R410 Disorientation, unspecified: Secondary | ICD-10-CM | POA: Diagnosis not present

## 2018-12-12 DIAGNOSIS — I618 Other nontraumatic intracerebral hemorrhage: Secondary | ICD-10-CM | POA: Diagnosis not present

## 2018-12-12 DIAGNOSIS — R531 Weakness: Secondary | ICD-10-CM | POA: Diagnosis not present

## 2018-12-12 DIAGNOSIS — I69354 Hemiplegia and hemiparesis following cerebral infarction affecting left non-dominant side: Secondary | ICD-10-CM | POA: Diagnosis not present

## 2018-12-12 DIAGNOSIS — I259 Chronic ischemic heart disease, unspecified: Secondary | ICD-10-CM | POA: Diagnosis not present

## 2018-12-12 DIAGNOSIS — Y92009 Unspecified place in unspecified non-institutional (private) residence as the place of occurrence of the external cause: Secondary | ICD-10-CM | POA: Diagnosis not present

## 2018-12-12 DIAGNOSIS — Z955 Presence of coronary angioplasty implant and graft: Secondary | ICD-10-CM | POA: Diagnosis not present

## 2018-12-12 DIAGNOSIS — I161 Hypertensive emergency: Secondary | ICD-10-CM | POA: Diagnosis present

## 2018-12-12 DIAGNOSIS — I639 Cerebral infarction, unspecified: Secondary | ICD-10-CM | POA: Diagnosis not present

## 2018-12-12 DIAGNOSIS — W19XXXA Unspecified fall, initial encounter: Secondary | ICD-10-CM | POA: Diagnosis present

## 2018-12-12 DIAGNOSIS — I10 Essential (primary) hypertension: Secondary | ICD-10-CM | POA: Diagnosis present

## 2018-12-12 DIAGNOSIS — D649 Anemia, unspecified: Secondary | ICD-10-CM | POA: Diagnosis not present

## 2018-12-12 DIAGNOSIS — Z7982 Long term (current) use of aspirin: Secondary | ICD-10-CM

## 2018-12-12 DIAGNOSIS — E46 Unspecified protein-calorie malnutrition: Secondary | ICD-10-CM | POA: Diagnosis not present

## 2018-12-12 DIAGNOSIS — M2550 Pain in unspecified joint: Secondary | ICD-10-CM | POA: Diagnosis not present

## 2018-12-12 DIAGNOSIS — Z951 Presence of aortocoronary bypass graft: Secondary | ICD-10-CM | POA: Diagnosis not present

## 2018-12-12 DIAGNOSIS — I352 Nonrheumatic aortic (valve) stenosis with insufficiency: Secondary | ICD-10-CM | POA: Diagnosis present

## 2018-12-12 LAB — DIFFERENTIAL
Abs Immature Granulocytes: 0.06 10*3/uL (ref 0.00–0.07)
Basophils Absolute: 0.1 10*3/uL (ref 0.0–0.1)
Basophils Relative: 0 %
Eosinophils Absolute: 0.1 10*3/uL (ref 0.0–0.5)
Eosinophils Relative: 1 %
Immature Granulocytes: 1 %
Lymphocytes Relative: 10 %
Lymphs Abs: 1.2 10*3/uL (ref 0.7–4.0)
Monocytes Absolute: 0.5 10*3/uL (ref 0.1–1.0)
Monocytes Relative: 4 %
NEUTROS ABS: 10.6 10*3/uL — AB (ref 1.7–7.7)
Neutrophils Relative %: 84 %

## 2018-12-12 LAB — CBC
HCT: 42.1 % (ref 39.0–52.0)
Hemoglobin: 13.8 g/dL (ref 13.0–17.0)
MCH: 29.5 pg (ref 26.0–34.0)
MCHC: 32.8 g/dL (ref 30.0–36.0)
MCV: 90 fL (ref 80.0–100.0)
Platelets: 238 10*3/uL (ref 150–400)
RBC: 4.68 MIL/uL (ref 4.22–5.81)
RDW: 13 % (ref 11.5–15.5)
WBC: 12.5 10*3/uL — ABNORMAL HIGH (ref 4.0–10.5)
nRBC: 0 % (ref 0.0–0.2)

## 2018-12-12 LAB — I-STAT CREATININE, ED: Creatinine, Ser: 1.3 mg/dL — ABNORMAL HIGH (ref 0.61–1.24)

## 2018-12-12 LAB — COMPREHENSIVE METABOLIC PANEL
ALK PHOS: 69 U/L (ref 38–126)
ALT: 21 U/L (ref 0–44)
AST: 32 U/L (ref 15–41)
Albumin: 4.1 g/dL (ref 3.5–5.0)
Anion gap: 11 (ref 5–15)
BUN: 15 mg/dL (ref 8–23)
CO2: 24 mmol/L (ref 22–32)
CREATININE: 1.32 mg/dL — AB (ref 0.61–1.24)
Calcium: 9.1 mg/dL (ref 8.9–10.3)
Chloride: 103 mmol/L (ref 98–111)
GFR calc Af Amer: 60 mL/min — ABNORMAL LOW (ref >60)
GFR calc non Af Amer: 52 mL/min — ABNORMAL LOW (ref >60)
Glucose, Bld: 134 mg/dL — ABNORMAL HIGH (ref 70–99)
Potassium: 4.2 mmol/L (ref 3.5–5.1)
Sodium: 138 mmol/L (ref 135–145)
Total Bilirubin: 0.9 mg/dL (ref 0.3–1.2)
Total Protein: 7.7 g/dL (ref 6.5–8.1)

## 2018-12-12 LAB — I-STAT TROPONIN, ED: Troponin i, poc: 0.04 ng/mL (ref 0.00–0.08)

## 2018-12-12 LAB — ETHANOL: Alcohol, Ethyl (B): <10 mg/dL (ref <10)

## 2018-12-12 LAB — PROTIME-INR
INR: 1.08
Prothrombin Time: 13.9 seconds (ref 11.4–15.2)

## 2018-12-12 LAB — APTT: aPTT: 29 seconds (ref 24–36)

## 2018-12-12 MED ORDER — SENNOSIDES-DOCUSATE SODIUM 8.6-50 MG PO TABS
1.0000 | ORAL_TABLET | Freq: Two times a day (BID) | ORAL | Status: DC
  Administered 2018-12-13 – 2018-12-16 (×4): 1 via ORAL
  Filled 2018-12-12 (×5): qty 1

## 2018-12-12 MED ORDER — SODIUM CHLORIDE 0.9 % IV SOLN
INTRAVENOUS | Status: DC | PRN
  Administered 2018-12-12: 500 mL via INTRAVENOUS

## 2018-12-12 MED ORDER — PANTOPRAZOLE SODIUM 40 MG IV SOLR
40.0000 mg | Freq: Every day | INTRAVENOUS | Status: DC
  Administered 2018-12-12 – 2018-12-15 (×4): 40 mg via INTRAVENOUS
  Filled 2018-12-12 (×4): qty 40

## 2018-12-12 MED ORDER — CLEVIDIPINE BUTYRATE 0.5 MG/ML IV EMUL
0.0000 mg/h | INTRAVENOUS | Status: DC
  Administered 2018-12-12: 1 mg/h via INTRAVENOUS

## 2018-12-12 MED ORDER — IOPAMIDOL (ISOVUE-370) INJECTION 76%
INTRAVENOUS | Status: AC
  Filled 2018-12-12: qty 100

## 2018-12-12 MED ORDER — LABETALOL HCL 5 MG/ML IV SOLN
INTRAVENOUS | Status: AC
  Administered 2018-12-12: 10 mg via INTRAVENOUS
  Filled 2018-12-12: qty 4

## 2018-12-12 MED ORDER — STROKE: EARLY STAGES OF RECOVERY BOOK
Freq: Once | Status: AC
  Administered 2018-12-13: 1
  Filled 2018-12-12: qty 1

## 2018-12-12 MED ORDER — IOPAMIDOL (ISOVUE-370) INJECTION 76%
75.0000 mL | Freq: Once | INTRAVENOUS | Status: AC | PRN
  Administered 2018-12-12: 75 mL via INTRAVENOUS

## 2018-12-12 MED ORDER — LABETALOL HCL 5 MG/ML IV SOLN
10.0000 mg | Freq: Once | INTRAVENOUS | Status: AC
  Administered 2018-12-12: 10 mg via INTRAVENOUS

## 2018-12-12 MED ORDER — CLEVIDIPINE BUTYRATE 0.5 MG/ML IV EMUL
INTRAVENOUS | Status: AC
  Administered 2018-12-12: 1 mg/h via INTRAVENOUS
  Filled 2018-12-12: qty 50

## 2018-12-12 MED ORDER — THIAMINE HCL 100 MG/ML IJ SOLN
100.0000 mg | INTRAMUSCULAR | Status: DC
  Administered 2018-12-12 – 2018-12-14 (×3): 100 mg via INTRAVENOUS
  Filled 2018-12-12 (×3): qty 2

## 2018-12-12 MED ORDER — DEXMEDETOMIDINE HCL IN NACL 200 MCG/50ML IV SOLN
0.2000 ug/kg/h | INTRAVENOUS | Status: DC
  Filled 2018-12-12: qty 50

## 2018-12-12 NOTE — ED Provider Notes (Signed)
[facility] EMERGENCY DEPARTMENT Provider Note   CSN: [id number] Arrival date & time: 12/12/18  1821     History   Chief Complaint No chief complaint on file.  Level 5 caveat: Altered mental status  HPI Edward Strickland is a 78 y.o. male.  HPI Patient is a 78 year old male who was last seen normal 3 hours ago.  He had a mechanical fall in his house and his friend called EMS where he was found to have left-sided deficits by EMS.  He was brought to the emergency department as a code stroke.  He reports mild headache at this time.  He has left-sided weakness.  He has left-sided neglect.  Eyes deviated to the right.  Some slurred speech.  Reported possible EtOH involved.  History of severe aortic stenosis status post TAVR.   Past Medical History:  Diagnosis Date  . Aortic stenosis, severe   . CAD (coronary artery disease)    a. 06/2017: s/p DES to LAD.   Marland Kitchen Chronic systolic CHF (congestive heart failure) ([place])   . COPD (chronic obstructive pulmonary disease) ([place])   . Hypertension   . S/P TAVR (transcatheter aortic valve replacement) 07/23/2017   29 mm Edwards Sapien 3 transcatheter heart valve placed via percutaneous right transfemoral approach    Patient Active Problem List   Diagnosis Date Noted  . S/P TAVR (transcatheter aortic valve replacement) 07/23/2017  . Coronary artery disease involving native coronary artery of native heart with unstable angina pectoris ([place])   . Pleural effusion   . Acute on chronic systolic CHF (congestive heart failure) ([place]) 07/09/2017  . Aortic stenosis 07/09/2017  . CKD (chronic kidney disease), stage III ([place]) 07/09/2017  . Essential hypertension 07/09/2017  . COPD (chronic obstructive pulmonary disease) ([place]) 07/09/2017    Past Surgical History:  Procedure Laterality Date  . CHEST TUBE INSERTION Right 07/23/2017   Procedure: INSERTION PLEURAL DRAINAGE CATHETER;  Surgeon: Rexene Alberts, MD;  Location: [facility];  Service:  Thoracic;  Laterality: Right;  . CORONARY STENT INTERVENTION N/A 07/17/2017   Procedure: CORONARY STENT INTERVENTION;  Surgeon: Burnell Blanks, MD;  Location: [facility];  Service: Cardiovascular;  Laterality: N/A;  . IR THORACENTESIS ASP PLEURAL SPACE W/IMG GUIDE  07/11/2017  . MULTIPLE EXTRACTIONS WITH ALVEOLOPLASTY N/A 07/15/2017   Procedure: Extraction of tooth #'s 4,6,7,8,9,15, 20,21,22, 23 ,24,26,and 27 with alveoloplassty;  Surgeon: Lenn Cal, DDS;  Location: [facility];  Service: Oral Surgery;  Laterality: N/A;  . REMOVAL OF PLEURAL DRAINAGE CATHETER N/A 08/07/2017   Procedure: REMOVAL OF PLEURAL DRAINAGE CATHETER;  Surgeon: Rexene Alberts, MD;  Location: [facility];  Service: Thoracic;  Laterality: N/A;  . TEE WITHOUT CARDIOVERSION N/A 07/23/2017   Procedure: TRANSESOPHAGEAL ECHOCARDIOGRAM (TEE);  Surgeon: Burnell Blanks, MD;  Location: [facility];  Service: Open Heart Surgery;  Laterality: N/A;  . TRANSCATHETER AORTIC VALVE REPLACEMENT, TRANSFEMORAL N/A 07/23/2017   Procedure: TRANSCATHETER AORTIC VALVE REPLACEMENT, TRANSFEMORAL;  Surgeon: Burnell Blanks, MD;  Location: [facility];  Service: Open Heart Surgery;  Laterality: N/A;        Home Medications    Prior to Admission medications   Medication Sig Start Date End Date Taking? Authorizing Provider  aspirin EC 81 MG tablet Take 81 mg by mouth daily.    [provider]  atorvastatin (LIPITOR) 80 MG tablet Take 1 tablet (80 mg total) by mouth daily at 6 PM. 11/13/17   Eileen Stanford, PA-C  carvedilol (COREG) 6.25  MG tablet Take 1 tablet (6.25 mg total) by mouth 2 (two) times daily with a meal. 07/10/18 10/08/18  Eileen Stanford, PA-C  diphenhydrAMINE (BENADRYL) 25 MG tablet Take 25 mg by mouth every 8 (eight) hours as needed for itching.    [provider]  ENTRESTO 24-26 MG Take 1 tablet by mouth 2 (two) times daily. 07/01/18   [provider]  furosemide (LASIX) 40 MG tablet  Take 1 tablet (40 mg total) by mouth daily. 07/09/18   Eileen Stanford, PA-C    Family History Family History  Problem Relation Age of Onset  . Congestive Heart Failure Paternal Uncle     Social History Social History   Tobacco Use  . Smoking status: Former Research scientist (life sciences)  . Smokeless tobacco: Never Used  Substance Use Topics  . Alcohol use: Never    Frequency: Never  . Drug use: Never     Allergies   Patient has no known allergies.   Review of Systems Review of Systems  Unable to perform ROS: Mental status change     Physical Exam Updated Vital Signs Wt 76.7 kg   BMI 27.29 kg/m   Physical Exam Vitals signs and nursing note reviewed.  Constitutional:      Appearance: He is well-developed.  HENT:     Head: Normocephalic and atraumatic.  Neck:     Musculoskeletal: Normal range of motion.  Cardiovascular:     Rate and Rhythm: Normal rate and regular rhythm.     Heart sounds: Normal heart sounds.  Pulmonary:     Effort: Pulmonary effort is normal. No respiratory distress.     Breath sounds: Normal breath sounds.  Abdominal:     General: There is no distension.     Palpations: Abdomen is soft.     Tenderness: There is no abdominal tenderness.  Musculoskeletal: Normal range of motion.  Skin:    General: Skin is warm and dry.  Neurological:     Mental Status: He is alert.     Comments: Oriented to person and place but not time.  Flaccid paralysis of his left upper and left lower extremity.  Left-sided neglect.  Eyes deviated to the right.  Mild dysarthric speech  Psychiatric:     Comments: Unable to test      ED Treatments / Results  Labs (all labs ordered are listed, but only abnormal results are displayed) Labs Reviewed  ETHANOL  PROTIME-INR  APTT  CBC  DIFFERENTIAL  COMPREHENSIVE METABOLIC PANEL  RAPID URINE DRUG SCREEN, HOSP PERFORMED  URINALYSIS, ROUTINE W REFLEX MICROSCOPIC  I-STAT CREATININE, ED    EKG None  Radiology No results  found.  Procedures .Critical Care Performed by: Jola Schmidt, MD Authorized by: Jola Schmidt, MD   Critical care provider statement:    Critical care time (minutes):  45   Critical care was time spent personally by me on the following activities:  Discussions with consultants, evaluation of patient's response to treatment, examination of patient, ordering and performing treatments and interventions, ordering and review of laboratory studies, ordering and review of radiographic studies, pulse oximetry, re-evaluation of patient's condition, obtaining history from patient or surrogate and review of old charts   (including critical care time)  Medications Ordered in ED Medications  clevidipine (CLEVIPREX) infusion 0.5 mg/mL (has no administration in time range)  labetalol (NORMODYNE,TRANDATE) 5 MG/ML injection (has no administration in time range)     Initial Impression / Assessment and Plan / ED Course  I have  reviewed the triage vital signs and the nursing notes.  Pertinent labs & imaging results that were available during my care of the patient were reviewed by me and considered in my medical decision making (see chart for details).     Patient with a intraparenchymal bleed on the right side as a cause of his left-sided weakness.  Patient will be admitted to the neuro intensive care unit.  Patient will be started on Cleviprex at this time.  Admit to the neuro ICU.  Final Clinical Impressions(s) / ED Diagnoses   Final diagnoses:  Intracranial bleed [facility])    ED Discharge Orders    None       Jola Schmidt, MD 12/12/18 [phone]

## 2018-12-12 NOTE — ED Notes (Signed)
Dr Rory Percy advised holding Precedex at this time.

## 2018-12-12 NOTE — ED Triage Notes (Signed)
[facility] EMS reports friend said pt fell around 1500-1530 today. He was unable to get him up but waited nearly 3 hours to call 911. Pt has left sided deficits gaze to right side. cbg 146. 160 sys bp.

## 2018-12-12 NOTE — H&P (Signed)
Chief Complaint: Fall, unable to get up  History obtained from: Patient and Chart     HPI:                                                                                                                                       Edward Strickland is an 78 y.o. male with past medical history of severe aortic stenosis, coronary artery disease status post DES on aspirin and Plavix, chronic systolic heart failure, COPD, hypertension presents to the emergency room with left hemiplegia and right-sided gaze as a stroke alert.  Patient  was with his friend when symptoms started at 3 PM, suddenly developed slurred speech and had a fall.  Patient's friend tried to get him up however after 3 hours decided to call EMS.  Blood pressure was 409 systolic by EMS.  On arrival stat CT head showed large right basal ganglia hemorrhage.  Patient was started on Cleveprix drip for rapid blood pressure control.   Date last known well: 2.14.20 Time last known well: 3pm tPA Given: no hemorrhage    Intracerebral Hemorrhage (ICH) Score  Glascow Coma Score 13-15 0  Age >/= 80 yes *no 0  ICH volume >/= 66ml  yes +1  IVH yes no 0  Infratentorial origin yesno 0 Total:  1   Past Medical History:  Diagnosis Date  . Aortic stenosis, severe   . CAD (coronary artery disease)    a. 06/2017: s/p DES to LAD.   Marland Kitchen Chronic systolic CHF (congestive heart failure) ([place])   . COPD (chronic obstructive pulmonary disease) ([place])   . Hypertension   . S/P TAVR (transcatheter aortic valve replacement) 07/23/2017   29 mm Edwards Sapien 3 transcatheter heart valve placed via percutaneous right transfemoral approach    Past Surgical History:  Procedure Laterality Date  . CHEST TUBE INSERTION Right 07/23/2017   Procedure: INSERTION PLEURAL DRAINAGE CATHETER;  Surgeon: Rexene Alberts, MD;  Location: [facility];  Service: Thoracic;  Laterality: Right;  . CORONARY STENT INTERVENTION N/A 07/17/2017   Procedure: CORONARY STENT INTERVENTION;   Surgeon: Burnell Blanks, MD;  Location: [facility];  Service: Cardiovascular;  Laterality: N/A;  . IR THORACENTESIS ASP PLEURAL SPACE W/IMG GUIDE  07/11/2017  . MULTIPLE EXTRACTIONS WITH ALVEOLOPLASTY N/A 07/15/2017   Procedure: Extraction of tooth #'s 4,6,7,8,9,15, 20,21,22, 23 ,24,26,and 27 with alveoloplassty;  Surgeon: Lenn Cal, DDS;  Location: [facility];  Service: Oral Surgery;  Laterality: N/A;  . REMOVAL OF PLEURAL DRAINAGE CATHETER N/A 08/07/2017   Procedure: REMOVAL OF PLEURAL DRAINAGE CATHETER;  Surgeon: Rexene Alberts, MD;  Location: [facility];  Service: Thoracic;  Laterality: N/A;  . TEE WITHOUT CARDIOVERSION N/A 07/23/2017   Procedure: TRANSESOPHAGEAL ECHOCARDIOGRAM (TEE);  Surgeon: Burnell Blanks, MD;  Location: [facility];  Service: Open Heart Surgery;  Laterality: N/A;  . TRANSCATHETER AORTIC VALVE REPLACEMENT, TRANSFEMORAL N/A  07/23/2017   Procedure: TRANSCATHETER AORTIC VALVE REPLACEMENT, TRANSFEMORAL;  Surgeon: Burnell Blanks, MD;  Location: [facility];  Service: Open Heart Surgery;  Laterality: N/A;    Family History  Problem Relation Age of Onset  . Congestive Heart Failure Paternal Uncle    Social History:  reports that he has quit smoking. He has never used smokeless tobacco. He reports that he does not drink alcohol or use drugs.  Allergies: No Known Allergies  Medications:                                                                                                                        I reviewed home medications   ROS:                                                                                                                                     14 systems reviewed and negative except above    Examination:                                                                                                      General: Appears well-developed  Psych: Affect appropriate to situation Eyes: No scleral injection HENT: No OP  obstrucion Head: Normocephalic.  Cardiovascular: Normal rate and regular rhythm.  Respiratory: Effort normal and breath sounds normal to anterior ascultation GI: Soft.  No distension. There is no tenderness.  Skin: WDI    Neurological Examination Mental Status: Alert, oriented, thought content appropriate.  Speech fluent without evidence of aphasia. Moderate dysarthria.  Able to follow 3 step commands without difficulty. Cranial Nerves: II: Visual fields : Left homonymous hemianopsia, neglect III,IV, VI: ptosis not present, right gaze deviation, pupils equal, round, reactive to light and accommodation V,VII: left  facial droop VIII: hearing normal bilaterally IX,X: uvula rises symmetrically XI: bilateral shoulder shrug XII: midline tongue extension Motor: Right : Upper extremity   5/5    Left:     Upper extremity   0/5  Lower extremity   5/5     Lower extremity   0/5 Tone and bulk:normal tone throughout; no atrophy noted Sensory: Sensory neglect  Deep Tendon Reflexes: 2+ and symmetric throughout Plantars: Right: downgoing   Left: downgoing Cerebellar: No gross ataxia on the right side Gait: Unable to walk      Lab Results: Basic Metabolic Panel: Recent Labs  Lab 12/12/18 1822 12/12/18 1832  NA 138  --   K 4.2  --   CL 103  --   CO2 24  --   GLUCOSE 134*  --   BUN 15  --   CREATININE 1.32* 1.30*  CALCIUM 9.1  --     CBC: Recent Labs  Lab 12/12/18 1822  WBC 12.5*  NEUTROABS 10.6*  HGB 13.8  HCT 42.1  MCV 90.0  PLT 238    Coagulation Studies: Recent Labs    12/12/18 1822  LABPROT 13.9  INR 1.08    Imaging: Ct Angio Head W Or Wo Contrast  Result Date: 12/12/2018 CLINICAL DATA:  Intracranial hemorrhage follow up EXAM: CT ANGIOGRAPHY HEAD TECHNIQUE: Multidetector CT imaging of the head was performed using the standard protocol during bolus administration of intravenous contrast. Multiplanar CT image reconstructions and MIPs were obtained to evaluate  the vascular anatomy. CONTRAST:  46mL ISOVUE-370 IOPAMIDOL (ISOVUE-370) INJECTION 76% COMPARISON:  Head CT 12/12/2018 FINDINGS: CTA HEAD FINDINGS POSTERIOR CIRCULATION: --Basilar artery: Normal. --Posterior cerebral arteries: Normal. Both originate from the basilar artery. --Superior cerebellar arteries: Normal. --Inferior cerebellar arteries: Normal anterior and posterior inferior cerebellar arteries. ANTERIOR CIRCULATION: --Intracranial internal carotid arteries: Atherosclerotic calcification of the internal carotid arteries at the skull base without hemodynamically significant stenosis. --Anterior cerebral arteries: Normal. Both A1 segments are present. Patent anterior communicating artery. --Middle cerebral arteries: Normal. --Posterior communicating arteries: Absent bilaterally. Vertebral arteries: Left dominant. The right vertebral artery terminates in PICA. VENOUS SINUSES: As permitted by contrast timing, patent. ANATOMIC VARIANTS: None DELAYED PHASE: No parenchymal contrast enhancement. No CTA spot sign or arteriovenous malformation. Review of the MIP images confirms the above findings. IMPRESSION: 1. No aneurysm, arteriovenous malformation or other causative hemorrhagic lesion. The distribution of the hematoma is most consistent with hypertensive hemorrhage. 2. No intracranial arterial occlusion or hemodynamically significant stenosis. Electronically Signed   By: Ulyses Jarred M.D.   On: 12/12/2018 20:14   Ct Head Code Stroke Wo Contrast  Result Date: 12/12/2018 CLINICAL DATA:  Code stroke. EXAM: CT HEAD WITHOUT CONTRAST TECHNIQUE: Contiguous axial images were obtained from the base of the skull through the vertex without intravenous contrast. COMPARISON:  None. FINDINGS: Brain: Acute hemorrhage within the right basal ganglia measuring 4.9 x 3.5 x 4.2 cm (volume = 38 cm^3)(AP x ML x CC). Surrounding edema and local mass effect partially effaces the right lateral ventricle and results in 1 mm of  right-to-left midline shift. No extra-axial collection or hydrocephalus. No additional focus of stroke or hemorrhage identified. Nonspecific white matter hypodensities are compatible with chronic microvascular ischemic changes and there is volume loss of the brain. Vascular: Calcific atherosclerosis of carotid siphons and vertebral arteries. No hyperdense vessel identified. Skull: Normal. Negative for fracture or focal lesion. Sinuses/Orbits: No acute finding. Other: None. ASPECTS [facility] Stroke Program Early CT Score) - Ganglionic level infarction (caudate, lentiform nuclei, internal capsule, insula, M1-M3 cortex): 7 - Supraganglionic infarction (M4-M6 cortex): 3 Total score (0-10 with 10 being normal): 10 IMPRESSION: 1. Acute hemorrhage in the right basal ganglia measuring up to 4.9 cm, 38 cc. Associate edema and  local mass effect partially effaces right lateral ventricle and results in 1 mm right-to-left midline shift. 2. ASPECTS is not applicable. 3. Chronic microvascular ischemic changes and volume loss of the brain. Critical Value/emergent results were called by telephone at the time of interpretation on 12/12/2018 at 6:43 pm to Dr. Venora Maples, who verbally acknowledged these results. Electronically Signed   By: Kristine Garbe M.D.   On: 12/12/2018 18:44     ASSESSMENT AND PLAN  78 year old male past medical history of aortic stenosis status post TAVR, coronary artery disease, hypertension on aspirin Plavix presents to the emergency room with right gaze deviation and left hemiplegia.  CT head shows large right basal ganglia hemorrhage.  Likely secondary to hypertension.    Right basal ganglia hemorrhage Left hemiplegia  Plan Admit to neuro ICU Start patient on Cleviprex drip for blood pressure control, goal less than 382 systolic CT angiogram of the head to rule out aneurysm/AVM No antiplatelets/anticoagulation N.p.o. until passes stroke swallow screen Frequent neurochecks  Chronic  systolic heart failure Coronary artery disease Aortic stenosis status post TAVR -Avoid excessive IV fluids -Hold aspirin, Plavix  Chronic kidney disease -We will trend creatinine as patient received contrast  COPD -Continue home nebs     This patient is neurologically critically ill due to [place].   He is at risk for significant risk of neurological worsening from   death from brain herniation, heart failure, infection, respiratory failure and seizure. This patient's care requires constant monitoring of vital signs, hemodynamics, respiratory and cardiac monitoring, review of multiple databases, neurological assessment, discussion with family, other specialists and medical decision making of high complexity.  I spent 45  minutes of neurocritical time in the care of this patient.       [facility] Pager Number [phone]

## 2018-12-12 NOTE — ED Notes (Signed)
Report attempted 

## 2018-12-13 ENCOUNTER — Inpatient Hospital Stay (HOSPITAL_COMMUNITY): Payer: Medicare Other

## 2018-12-13 DIAGNOSIS — I610 Nontraumatic intracerebral hemorrhage in hemisphere, subcortical: Secondary | ICD-10-CM

## 2018-12-13 LAB — URINALYSIS, ROUTINE W REFLEX MICROSCOPIC
Bilirubin Urine: NEGATIVE
GLUCOSE, UA: NEGATIVE mg/dL
Hgb urine dipstick: NEGATIVE
Ketones, ur: NEGATIVE mg/dL
Leukocytes,Ua: NEGATIVE
Nitrite: NEGATIVE
Protein, ur: NEGATIVE mg/dL
Specific Gravity, Urine: 1.032 — ABNORMAL HIGH (ref 1.005–1.030)
pH: 5 (ref 5.0–8.0)

## 2018-12-13 LAB — RAPID URINE DRUG SCREEN, HOSP PERFORMED
AMPHETAMINES: NOT DETECTED
Barbiturates: NOT DETECTED
Benzodiazepines: NOT DETECTED
Cocaine: NOT DETECTED
Opiates: NOT DETECTED
Tetrahydrocannabinol: NOT DETECTED

## 2018-12-13 LAB — MRSA PCR SCREENING: MRSA BY PCR: NEGATIVE

## 2018-12-13 MED ORDER — ORAL CARE MOUTH RINSE
15.0000 mL | Freq: Two times a day (BID) | OROMUCOSAL | Status: DC
  Administered 2018-12-13 – 2018-12-16 (×6): 15 mL via OROMUCOSAL

## 2018-12-13 NOTE — Progress Notes (Signed)
Bilateral carotid duplex exam completed. Please see preliminary notes on CV PROC under chart review/ Izzy Courville H Kamali Sakata(RDMS RVT) 12/13/18 1:43 PM

## 2018-12-13 NOTE — Evaluation (Signed)
Clinical/Bedside Swallow Evaluation Patient Details  Name: Edward Strickland MRN: [id number] Date of Birth: June 17, 1941  Today's Date: 12/13/2018 Time: SLP Start Time (ACUTE ONLY): 7035 SLP Stop Time (ACUTE ONLY): 1628 SLP Time Calculation (min) (ACUTE ONLY): 11 min  Past Medical History:  Past Medical History:  Diagnosis Date  . Aortic stenosis, severe   . CAD (coronary artery disease)    a. 06/2017: s/p DES to LAD.   Marland Kitchen Chronic systolic CHF (congestive heart failure) ([place])   . COPD (chronic obstructive pulmonary disease) ([place])   . Hypertension   . S/P TAVR (transcatheter aortic valve replacement) 07/23/2017   29 mm Edwards Sapien 3 transcatheter heart valve placed via percutaneous right transfemoral approach   Past Surgical History:  Past Surgical History:  Procedure Laterality Date  . CHEST TUBE INSERTION Right 07/23/2017   Procedure: INSERTION PLEURAL DRAINAGE CATHETER;  Surgeon: Rexene Alberts, MD;  Location: [place];  Service: Thoracic;  Laterality: Right;  . CORONARY STENT INTERVENTION N/A 07/17/2017   Procedure: CORONARY STENT INTERVENTION;  Surgeon: Burnell Blanks, MD;  Location: [place] [facility];  Service: Cardiovascular;  Laterality: N/A;  . IR THORACENTESIS ASP PLEURAL SPACE W/IMG GUIDE  07/11/2017  . MULTIPLE EXTRACTIONS WITH ALVEOLOPLASTY N/A 07/15/2017   Procedure: Extraction of tooth #'s 4,6,7,8,9,15, 20,21,22, 23 ,24,26,and 27 with alveoloplassty;  Surgeon: Lenn Cal, DDS;  Location: [place];  Service: Oral Surgery;  Laterality: N/A;  . REMOVAL OF PLEURAL DRAINAGE CATHETER N/A 08/07/2017   Procedure: REMOVAL OF PLEURAL DRAINAGE CATHETER;  Surgeon: Rexene Alberts, MD;  Location: [facility];  Service: Thoracic;  Laterality: N/A;  . TEE WITHOUT CARDIOVERSION N/A 07/23/2017   Procedure: TRANSESOPHAGEAL ECHOCARDIOGRAM (TEE);  Surgeon: Burnell Blanks, MD;  Location: [facility];  Service: Open Heart Surgery;  Laterality: N/A;  . TRANSCATHETER AORTIC VALVE REPLACEMENT,  TRANSFEMORAL N/A 07/23/2017   Procedure: TRANSCATHETER AORTIC VALVE REPLACEMENT, TRANSFEMORAL;  Surgeon: Burnell Blanks, MD;  Location: [place];  Service: Open Heart Surgery;  Laterality: N/A;   HPI:  Edward Cooperis an 78 y.o.malewith past medical history of severe aortic stenosis, coronary artery disease status post DES on aspirin and Plavix, chronic systolic heart failure, COPD, hypertension presents to the emergency room with left hemiplegia and right-sided gaze as a stroke alert. On arrival stat CT head showed large right basal ganglia hemorrhage. No chest imaging available at present   Assessment / Plan / Recommendation Clinical Impression  Pt presents with mild oral dysphagia and clinical indicators of pharyngeal dysphagia.  There was immediate cough with thin liquid on 1 of two trials.  Pt tolerated nectar thick liquids by cup and straw with no clinical s/s of aspiration even when allowed to independently drink, which resulted in impulsive serial sips. Pt had harsh vocal quality at baseline, which remained consistent throughout assessment. With regular and soft solids there was pocketing on L side, which eventually cleared with subsequent bolus trials.  Pt exhibited good clearance of puree and no clinical s/s of aspiration.  Although pt initially passed [place], difficulty was noted during midday meal.  Recommend change in diet to puree with nectar thick liquid and further evaluation of pharyngeal swallow function by MBSS.   SLP Visit Diagnosis: Dysphagia, oropharyngeal phase (R13.12)    Aspiration Risk  Moderate aspiration risk    Diet Recommendation Dysphagia 1 (Puree);Honey-thick liquid   Medication Administration: Crushed with puree Supervision: Full supervision/cueing for compensatory strategies Compensations: Slow rate;Small sips/bites Postural Changes: Seated upright at 90 degrees  Other  Recommendations     Follow up Recommendations          Swallow  Study   General HPI: Edward Strickland an 78 y.o.malewith past medical history of severe aortic stenosis, coronary artery disease status post DES on aspirin and Plavix, chronic systolic heart failure, COPD, hypertension presents to the emergency room with left hemiplegia and right-sided gaze as a stroke alert. On arrival stat CT head showed large right basal ganglia hemorrhage. No chest imaging available at present Type of Study: Bedside Swallow Evaluation Previous Swallow Assessment: initially passed [place] by nursing, but noted to have difficulty with lunch Diet Prior to this Study: Dysphagia 3 (soft) Temperature Spikes Noted: No History of Recent Intubation: No Behavior/Cognition: Alert;Confused Oral Cavity Assessment: Within Functional Limits Oral Cavity - Dentition: Edentulous;Dentures, not available(Pt reports he does not always wear with PO intake) Self-Feeding Abilities: Needs assist Patient Positioning: Upright in bed Baseline Vocal Quality: (Harsh vocal quality at baseline) Volitional Cough: Weak Volitional Swallow: Unable to elicit    Oral/Motor/Sensory Function Overall Oral Motor/Sensory Function: Moderate impairment Facial ROM: Reduced left Facial Symmetry: Abnormal symmetry left Facial Strength: Reduced left Lingual ROM: Within Functional Limits Lingual Symmetry: Within Functional Limits Lingual Strength: Reduced   Ice Chips Ice chips: Not tested   Thin Liquid Thin Liquid: Impaired Pharyngeal  Phase Impairments: Cough - Immediate    Nectar Thick Nectar Thick Liquid: Within functional limits   Honey Thick Honey Thick Liquid: Not tested   Puree Puree: Within functional limits Presentation: Spoon   Solid     Solid: Impaired Presentation: Spoon Oral Phase Functional Implications: Left lateral sulci pocketing      Edward Savage, MA, [place] Office: [phone]; Pager (2/15): [phone] 12/13/2018,4:51 PM

## 2018-12-13 NOTE — Progress Notes (Signed)
Rehab Admissions Coordinator Note:  Patient was screened by Cleatrice Burke for appropriateness for an Inpatient Acute Rehab Consult per PT recs.   At this time, we are recommending Inpatient Rehab consult. Please place order for consult.  Cleatrice Burke 12/13/2018, 1:21 PM  I can be reached at [phone].

## 2018-12-13 NOTE — Progress Notes (Signed)
STROKE TEAM PROGRESS NOTE   HISTORY OF PRESENT ILLNESS (per record) Edward Strickland is an 78 y.o. male with past medical history of severe aortic stenosis, coronary artery disease status post DES on aspirin and Plavix, chronic systolic heart failure, COPD, hypertension presents to the emergency room with left hemiplegia and right-sided gaze as a stroke alert. Patient  was with his friend when symptoms started at 3 PM, suddenly developed slurred speech and had a fall.  Patient's friend tried to get him up however after 3 hours decided to call EMS.  Blood pressure was 810 systolic by EMS.  On arrival stat CT head showed large right basal ganglia hemorrhage.  Patient was started on Cleveprix drip for rapid blood pressure control. Date last known well: 2.14.20 Time last known well: 3pm tPA Given: no hemorrhage    SUBJECTIVE (INTERVAL HISTORY) No family members present. Appears to be confused at times. Pleasant. Able to follow commands.Blood pressure is adequately controlled. He appears to have left hemiplegia and confusion    OBJECTIVE Vitals:   12/13/18 0500 12/13/18 0600 12/13/18 0700 12/13/18 0800  BP: (!) 107/59 (!) 108/54 (!) 101/51 119/66  Pulse: 65 61 60 68  Resp: 12 15 15  (!) 23  Temp:    98 F (36.7 C)  TempSrc:    Oral  SpO2: 96% 98% 98% 97%  Weight:      Height:        CBC:  Recent Labs  Lab 12/12/18 1822  WBC 12.5*  NEUTROABS 10.6*  HGB 13.8  HCT 42.1  MCV 90.0  PLT 175    Basic Metabolic Panel:  Recent Labs  Lab 12/12/18 1822 12/12/18 1832  NA 138  --   K 4.2  --   CL 103  --   CO2 24  --   GLUCOSE 134*  --   BUN 15  --   CREATININE 1.32* 1.30*  CALCIUM 9.1  --     Lipid Panel: No results found for: CHOL, TRIG, HDL, CHOLHDL, VLDL, LDLCALC HgbA1c: No results found for: HGBA1C Urine Drug Screen: No results found for: LABOPIA, COCAINSCRNUR, LABBENZ, AMPHETMU, THCU, LABBARB  Alcohol Level     Component Value Date/Time   ETH <10 12/12/2018 1822     IMAGING  Ct Angio Head W Or Wo Contrast 12/12/2018 IMPRESSION:  1. No aneurysm, arteriovenous malformation or other causative hemorrhagic lesion. The distribution of the hematoma is most consistent with hypertensive hemorrhage.  2. No intracranial arterial occlusion or hemodynamically significant stenosis.    Ct Head Code Stroke Wo Contrast 12/12/2018 IMPRESSION:  1. Acute hemorrhage in the right basal ganglia measuring up to 4.9 cm, 38 cc. Associate edema and local mass effect partially effaces right lateral ventricle and results in 1 mm right-to-left midline shift.  2. ASPECTS is not applicable.  3. Chronic microvascular ischemic changes and volume loss of the brain.    Transthoracic Echocardiogram - pending   Bilateral Carotid Dopplers - pending  urine drug screen negative   PHYSICAL EXAM Blood pressure 119/66, pulse 68, temperature 98 F (36.7 C), temperature source Oral, resp. rate (!) 23, height 5\' 7"  (1.702 m), weight 55.4 kg, SpO2 97 %.  Frail unkempt malnourished looking elderly male not in distress  . Afebrile. Head is nontraumatic. Neck is supple without bruit.    Cardiac exam no murmur or gallop. Lungs are clear to auscultation. Distal pulses are well felt.  Neurological Exam :  He is awake and alert oriented 2. Diminished attention, registration  and recall. Poor insight into his condition.mild dysarthria. No aphasia. Follows simple commands well. Right gaze preference. Will not look to the left. Mild left-sided neglect. Blinks to threat on the right but not on the left. Left lower face weakness. Tongue midline. Left hemiplegia 2-3/5 left upper extremity and 2/5 left lower extremity. Increased tone in the left lower extremity.left plantar upgoing right downgoing. Gait not tested   ASSESSMENT/PLAN Edward Strickland is a 78 y.o. male with history ofsevere aortic stenosis, coronary artery disease status post DES on aspirin and Plavix, chronic systolic heart failure,  COPD, hypertension  presenting with sudden onset slurred speech and subsequent fall.  He did not receive IV t-PA due to [place].  Large right basal ganglia hemorrhage.   Resultant  Left hemiplegia  CT head - Acute hemorrhage in the right basal ganglia measuring up to 4.9 cm, 38 cc. Associate edema and local mass effect partially effaces right lateral ventricle and results in 1 mm right-to-left midline shift.   MRI head - pending  MRA head - not indicated  CTA Head -  No aneurysm, arteriovenous malformation or other causative hemorrhagic lesion. The distribution of the hematoma is most consistent with hypertensive hemorrhage.  Carotid Doppler - pending  2D Echo - pending  LDL - pending  HgbA1c - pending  UDS - pending  VTE prophylaxis - SCDs  Diet - NPO  aspirin 81 mg daily prior to admission, now on No antithrombotic  Ongoing aggressive stroke risk factor management  Therapy recommendations:  pending  Disposition:  Pending  Hypertension  Stable (Cleviprex) . Long-term BP goal normotensive  Hyperlipidemia  Lipid lowering medication PTA: Lipitor 80 mg daily  LDL pending, goal < 70  Current lipid lowering medication: none  Continue statin at discharge    Other Stroke Risk Factors  Advanced age  Former cigarette smoker - quit  Coronary artery disease   Other Active Problems  Creatinine - 1.30  Leukocytosis - 12.5 (afebrile)   Hospital day # 1  Mikey Bussing PA-C [facility] Pager [phone] 12/13/2018, 11:46 AM I have personally obtained history,examined this patient, reviewed notes, independently viewed imaging studies, participated in medical decision making and plan of care.ROS completed by me personally and pertinent positives fully documented  I have made any additions or clarifications directly to the above note. Agree with note above. He presented with left hemiplegia due to right brain subcortical hemorrhage likely of  hypertensive etiology. Continue close observation in ICU with strict blood pressure control and neurological monitoring as per MS protocol. Keep systolic blood pressure less than 140 for first 24 hours and then below 160. Mobilize out of bed. Physical occupational and speech therapy consults. No family available at the bedside for discussion. Check later MRI scan the brain and echocardiogram, carotid Dopplers. This patient is critically ill and at significant risk of neurological worsening, death and care requires constant monitoring of vital signs, hemodynamics,respiratory and cardiac monitoring, extensive review of multiple databases, frequent neurological assessment, discussion with family, other specialists and medical decision making of high complexity.I have made any additions or clarifications directly to the above note.This critical care time does not reflect procedure time, or teaching time or supervisory time of PA/NP/Med Resident etc but could involve care discussion time.  I spent 40 minutes of neurocritical care time  in the care of  this patient.     Antony Contras, MD Medical Director [facility] Pager: [phone] 12/13/2018 1:40 PM   To contact  Stroke Continuity provider, please refer to [email or web address] After hours, contact General Neurology

## 2018-12-13 NOTE — Evaluation (Signed)
Physical Therapy Evaluation Patient Details Name: Edward Strickland MRN: [id number] DOB: 01/18/1941 Today's Date: 12/13/2018   History of Present Illness  Pt is a 78 y/o male with a PMH significant for aortic stenosis, CAD s/p DES, TAVR 3151, chronic systolic heart failure, COPD, HTN. He presents to the ED with L-sided weakness and R-sided gaze. After sudden onset of symptoms and fall at home, friend waited 3 hours to alert EMS. Upon arrival to ED, stat CT revealed large R basal ganglia hemorrhage.   Clinical Impression  Pt admitted with above diagnosis. Pt currently with functional limitations due to the deficits listed below (see PT Problem List). At the time of PT eval, pt pleasant and agreeable to therapy. Pt A/O x4 however hallucinating a dog sitting by the bathroom door, and with grossly tangential thoughts and speech. No family present at the time of PT eval and pt a poor historian. Recommending CIR consult to maximize functional independence and safety at this time. Acutely, pt will benefit from skilled PT to increase their independence and safety with mobility to allow discharge to the venue listed below.       Follow Up Recommendations CIR;Supervision/Assistance - 24 hour    Equipment Recommendations  Other (comment)(TBD by next venue of care)    Recommendations for Other Services Rehab consult     Precautions / Restrictions Precautions Precautions: Fall Precaution Comments: increased tone in LLE Restrictions Weight Bearing Restrictions: No      Mobility  Bed Mobility Overal bed mobility: Needs Assistance Bed Mobility: Supine to Sit;Sit to Supine     Supine to sit: Max assist;+2 for physical assistance;HOB elevated Sit to supine: Total assist;+2 for safety/equipment   General bed mobility comments: +2 assist required for transition to/from EOB. Pt with heavy L lateral lean in sitting, and with L hip drifting towards EOB 2 extensor tone in L knee and pt pushing posteriorly in  trunk.   Transfers                 General transfer comment: Attempted sit>stand at least to reposition on EOB. Unable to safely attempt due to knee extension in LLE and diffculty following commands for anterior lean in trunk.   Ambulation/Gait                Stairs            Wheelchair Mobility    Modified Rankin (Stroke Patients Only) Modified Rankin (Stroke Patients Only) Pre-Morbid Rankin Score: Moderate disability Modified Rankin: Severe disability     Balance Overall balance assessment: Needs assistance Sitting-balance support: Feet supported;Single extremity supported Sitting balance-Leahy Scale: Zero Sitting balance - Comments: Max assist required to maintain upright sitting posture.                                      Pertinent Vitals/Pain Pain Assessment: Faces Faces Pain Scale: No hurt Pain Intervention(s): Monitored during session    Home Living Family/patient expects to be discharged to:: Private residence Living Arrangements: Alone   Type of Home: House Home Access: Level entry     Home Layout: One level Home Equipment: Environmental consultant - 2 wheels;Cane - single point Additional Comments: Information taken from prior admission - unsure if details are still correct    Prior Function Level of Independence: Needs assistance         Comments: Was relying on neighbor for assist with iADL's, and  was independent with ADL's. Info again taken from prior admission.      Hand Dominance        Extremity/Trunk Assessment   Upper Extremity Assessment Upper Extremity Assessment: LUE deficits/detail LUE Deficits / Details: flaccid LUE Coordination: decreased fine motor;decreased gross motor    Lower Extremity Assessment Lower Extremity Assessment: LLE deficits/detail LLE Deficits / Details: Increased tone in knee extensors LLE Coordination: decreased fine motor;decreased gross motor    Cervical / Trunk Assessment Cervical  / Trunk Assessment: Other exceptions Cervical / Trunk Exceptions: Forward head posture with rounded shoulders  Communication   Communication: No difficulties  Cognition Arousal/Alertness: Awake/alert Behavior During Therapy: WFL for tasks assessed/performed Overall Cognitive Status: Impaired/Different from baseline Area of Impairment: Orientation;Attention;Memory;Following commands;Safety/judgement;Awareness;Problem solving                 Orientation Level: (Oriented x4) Current Attention Level: Sustained Memory: Decreased short-term memory Following Commands: Follows one step commands consistently;Follows one step commands with increased time;Follows multi-step commands inconsistently Safety/Judgement: Decreased awareness of safety;Decreased awareness of deficits Awareness: Intellectual Problem Solving: Slow processing;Decreased initiation;Difficulty sequencing;Requires verbal cues        General Comments      Exercises     Assessment/Plan    PT Assessment Patient needs continued PT services  PT Problem List Decreased strength;Decreased range of motion;Decreased activity tolerance;Decreased balance;Decreased mobility;Decreased knowledge of use of DME;Decreased cognition;Decreased safety awareness;Decreased knowledge of precautions;Impaired tone;Impaired sensation       PT Treatment Interventions DME instruction;Gait training;Stair training;Functional mobility training;Therapeutic activities;Therapeutic exercise;Neuromuscular re-education;Cognitive remediation;Patient/family education    PT Goals (Current goals can be found in the Care Plan section)  Acute Rehab PT Goals Patient Stated Goal: Pt did not state goals. Talking only about cornbread and his guitar at end of session PT Goal Formulation: Patient unable to participate in goal setting Time For Goal Achievement: 12/27/18 Potential to Achieve Goals: Fair    Frequency Min 4X/week   Barriers to discharge         Co-evaluation               AM-PAC PT "6 Clicks" Mobility  Outcome Measure Help needed turning from your back to your side while in a flat bed without using bedrails?: Total Help needed moving from lying on your back to sitting on the side of a flat bed without using bedrails?: Total Help needed moving to and from a bed to a chair (including a wheelchair)?: Total Help needed standing up from a chair using your arms (e.g., wheelchair or bedside chair)?: Total Help needed to walk in hospital room?: Total Help needed climbing 3-5 steps with a railing? : Total 6 Click Score: 6    End of Session Equipment Utilized During Treatment: Gait belt Activity Tolerance: Patient tolerated treatment well Patient left: in bed;with call bell/phone within reach;with bed alarm set Nurse Communication: Mobility status PT Visit Diagnosis: Hemiplegia and hemiparesis;Other symptoms and signs involving the nervous system (R29.898) Hemiplegia - Right/Left: Left Hemiplegia - caused by: Nontraumatic intracerebral hemorrhage    Time: 0917-0941 PT Time Calculation (min) (ACUTE ONLY): 24 min   Charges:   PT Evaluation $PT Eval High Complexity: 1 High PT Treatments $Gait Training: 8-22 mins        Rolinda Roan, PT, DPT Acute Rehabilitation Services Pager: [phone] Office: [place] 12/13/2018, 12:01 PM

## 2018-12-13 NOTE — Progress Notes (Signed)
PT Cancellation Note  Patient Details Name: Edward Strickland MRN: [id number] DOB: 06/21/1941   Cancelled Treatment:    Reason Eval/Treat Not Completed: Active bedrest order. Will continue to follow for medical readiness to initiate PT evaluation.    Thelma Comp 12/13/2018, 7:18 AM   Rolinda Roan, PT, DPT Acute Rehabilitation Services Pager: [phone] Office: [phone]

## 2018-12-14 DIAGNOSIS — I610 Nontraumatic intracerebral hemorrhage in hemisphere, subcortical: Principal | ICD-10-CM

## 2018-12-14 LAB — LIPID PANEL
Cholesterol: 126 mg/dL (ref 0–200)
HDL: 32 mg/dL — AB (ref >40)
LDL Cholesterol: 56 mg/dL (ref 0–99)
TRIGLYCERIDES: 190 mg/dL — AB (ref <150)
Total CHOL/HDL Ratio: 3.9 RATIO
VLDL: 38 mg/dL (ref 0–40)

## 2018-12-14 NOTE — Progress Notes (Signed)
STROKE TEAM PROGRESS NOTE   SUBJECTIVE (INTERVAL HISTORY) No family members present. He is sitting up in a bedside chair and getting help with feeding himself. Continues to have left hemiparesis which is unchanged..Blood pressure is adequately controlled.  OBJECTIVE Vitals:   12/14/18 0800 12/14/18 0900 12/14/18 1000 12/14/18 1200  BP: (!) 141/59 130/62 (!) 128/57   Pulse: 74 76 86   Resp: 13 15 16    Temp: 98.4 F (36.9 C)   98.4 F (36.9 C)  TempSrc: Oral   Oral  SpO2: 94% 96% 96%   Weight:      Height:        CBC:  Recent Labs  Lab 12/12/18 1822  WBC 12.5*  NEUTROABS 10.6*  HGB 13.8  HCT 42.1  MCV 90.0  PLT 573    Basic Metabolic Panel:  Recent Labs  Lab 12/12/18 1822 12/12/18 1832  NA 138  --   K 4.2  --   CL 103  --   CO2 24  --   GLUCOSE 134*  --   BUN 15  --   CREATININE 1.32* 1.30*  CALCIUM 9.1  --     Lipid Panel:     Component Value Date/Time   CHOL 126 12/14/2018 0021   TRIG 190 (H) 12/14/2018 0021   HDL 32 (L) 12/14/2018 0021   CHOLHDL 3.9 12/14/2018 0021   VLDL 38 12/14/2018 0021   LDLCALC 56 12/14/2018 0021   HgbA1c: No results found for: HGBA1C Urine Drug Screen:     Component Value Date/Time   LABOPIA NONE DETECTED 12/13/2018 0925   COCAINSCRNUR NONE DETECTED 12/13/2018 0925   LABBENZ NONE DETECTED 12/13/2018 0925   AMPHETMU NONE DETECTED 12/13/2018 0925   THCU NONE DETECTED 12/13/2018 0925   LABBARB NONE DETECTED 12/13/2018 0925    Alcohol Level     Component Value Date/Time   ETH <10 12/12/2018 1822    IMAGING  Ct Angio Head W Or Wo Contrast 12/12/2018 IMPRESSION:  1. No aneurysm, arteriovenous malformation or other causative hemorrhagic lesion. The distribution of the hematoma is most consistent with hypertensive hemorrhage.  2. No intracranial arterial occlusion or hemodynamically significant stenosis.    Ct Head Code Stroke Wo Contrast 12/12/2018 IMPRESSION:  1. Acute hemorrhage in the right basal ganglia  measuring up to 4.9 cm, 38 cc. Associate edema and local mass effect partially effaces right lateral ventricle and results in 1 mm right-to-left midline shift.  2. ASPECTS is not applicable.  3. Chronic microvascular ischemic changes and volume loss of the brain.    Transthoracic Echocardiogram - pending   Bilateral Carotid Dopplers - bilateral 1-39 percent carotid stenosis urine drug screen negative   PHYSICAL EXAM Blood pressure (!) 128/57, pulse 86, temperature 98.4 F (36.9 C), temperature source Oral, resp. rate 16, height 5\' 7"  (1.702 m), weight 55.4 kg, SpO2 96 %.  Frail unkempt malnourished looking elderly male not in distress  . Afebrile. Head is nontraumatic. Neck is supple without bruit.    Cardiac exam no murmur or gallop. Lungs are clear to auscultation. Distal pulses are well felt.  Neurological Exam :  He is awake and alert oriented 2. Diminished attention, registration and recall. Poor insight into his condition.mild dysarthria. No aphasia. Follows simple commands well. Right gaze preference. Will not look to the left.  . Blinks to threat on the right but not on the left. Left lower face weakness. Tongue midline. Left hemiplegia 1-2/5 left upper extremity and 2/5 left lower extremity. Increased  tone in the left lower extremity.left plantar upgoing right downgoing. Gait not tested   ASSESSMENT/PLAN Mr. Gildardo Tickner is a 78 y.o. male with history ofsevere aortic stenosis, coronary artery disease status post DES on aspirin and Plavix, chronic systolic heart failure, COPD, hypertension  presenting with sudden onset slurred speech and subsequent fall.  He did not receive IV t-PA due to [place].  Large right basal ganglia hemorrhage.   Resultant  Left hemiplegia  CT head - Acute hemorrhage in the right basal ganglia measuring up to 4.9 cm, 38 cc. Associate edema and local mass effect partially effaces right lateral ventricle and results in 1 mm right-to-left midline shift.    MRI head -5 x 3.0 x 4.3 cm right basal ganglia hemorrhage with mild surrounding edema and mass effect on right lateral ventricle with 3 mm shift but no hydrocephalus.  MRA head - See CT angiogram head  CTA Head -  No aneurysm, arteriovenous malformation or other causative hemorrhagic lesion. The distribution of the hematoma is most consistent with hypertensive hemorrhage.  Carotid Doppler - 1-39 percent bilateral stenosis  2D Echo - pending  LDL - 56 mg%  HgbA1c - pending  UDS - negative  VTE prophylaxis - SCDs  Diet - NPO  aspirin 81 mg daily prior to admission, now on No antithrombotic  Ongoing aggressive stroke risk factor management  Therapy recommendations:  CLR  Disposition:  Pending  Hypertension  Stable (Cleviprex) . Long-term BP goal normotensive  Hyperlipidemia  Lipid lowering medication PTA: Lipitor 80 mg daily  LDL 56 mg percent, goal < 70  Current lipid lowering medication: none  Continue statin at discharge    Other Stroke Risk Factors  Advanced age  Former cigarette smoker - quit  Coronary artery disease   Other Active Problems  Creatinine - 1.30  Leukocytosis - 12.5 (afebrile)   Hospital day # 2   He presented with left hemiplegia due to right brain subcortical hemorrhage likely of hypertensive etiology. Continue  strict blood pressure control and neurological monitoring  l. Keep systolic blood pressure less than  160. Mobilize out of bed. Physical occupational and speech therapy and rehabilitation consults. No family available at the bedside for discussion. Transfer to neurology floor bed later when available. This patient is critically ill and at significant risk of neurological worsening, death and care requires constant monitoring of vital signs, hemodynamics,respiratory and cardiac monitoring, extensive review of multiple databases, frequent neurological assessment, discussion with family, other specialists and medical decision  making of high complexity.I have made any additions or clarifications directly to the above note.This critical care time does not reflect procedure time, or teaching time or supervisory time of PA/NP/Med Resident etc but could involve care discussion time.  I spent 30 minutes of neurocritical care time  in the care of  this patient.     Antony Contras, MD Medical Director [facility] Pager: [phone] 12/14/2018 2:28 PM   To contact Stroke Continuity provider, please refer to [email or web address] After hours, contact General Neurology

## 2018-12-14 NOTE — Evaluation (Signed)
Speech Language Pathology Evaluation Patient Details Name: Edward Strickland MRN: [id number] DOB: 02/06/1941 Today's Date: 12/14/2018 Time: 0017-4944 SLP Time Calculation (min) (ACUTE ONLY): 25 min  Problem List:  Patient Active Problem List   Diagnosis Date Noted  . ICH (intracerebral hemorrhage) ([place]) 12/12/2018  . S/P TAVR (transcatheter aortic valve replacement) 07/23/2017  . Coronary artery disease involving native coronary artery of native heart with unstable angina pectoris ([place])   . Pleural effusion   . Acute on chronic systolic CHF (congestive heart failure) ([place]) 07/09/2017  . Aortic stenosis 07/09/2017  . CKD (chronic kidney disease), stage III ([place]) 07/09/2017  . Essential hypertension 07/09/2017  . COPD (chronic obstructive pulmonary disease) ([place]) 07/09/2017   Past Medical History:  Past Medical History:  Diagnosis Date  . Aortic stenosis, severe   . CAD (coronary artery disease)    a. 06/2017: s/p DES to LAD.   Marland Kitchen Chronic systolic CHF (congestive heart failure) ([place])   . COPD (chronic obstructive pulmonary disease) ([place])   . Hypertension   . S/P TAVR (transcatheter aortic valve replacement) 07/23/2017   29 mm Edwards Sapien 3 transcatheter heart valve placed via percutaneous right transfemoral approach   Past Surgical History:  Past Surgical History:  Procedure Laterality Date  . CHEST TUBE INSERTION Right 07/23/2017   Procedure: INSERTION PLEURAL DRAINAGE CATHETER;  Surgeon: Rexene Alberts, MD;  Location: [place];  Service: Thoracic;  Laterality: Right;  . CORONARY STENT INTERVENTION N/A 07/17/2017   Procedure: CORONARY STENT INTERVENTION;  Surgeon: Burnell Blanks, MD;  Location: [facility];  Service: Cardiovascular;  Laterality: N/A;  . IR THORACENTESIS ASP PLEURAL SPACE W/IMG GUIDE  07/11/2017  . MULTIPLE EXTRACTIONS WITH ALVEOLOPLASTY N/A 07/15/2017   Procedure: Extraction of tooth #'s 4,6,7,8,9,15, 20,21,22, 23 ,24,26,and 27 with alveoloplassty;   Surgeon: Lenn Cal, DDS;  Location: [place];  Service: Oral Surgery;  Laterality: N/A;  . REMOVAL OF PLEURAL DRAINAGE CATHETER N/A 08/07/2017   Procedure: REMOVAL OF PLEURAL DRAINAGE CATHETER;  Surgeon: Rexene Alberts, MD;  Location: [place];  Service: Thoracic;  Laterality: N/A;  . TEE WITHOUT CARDIOVERSION N/A 07/23/2017   Procedure: TRANSESOPHAGEAL ECHOCARDIOGRAM (TEE);  Surgeon: Burnell Blanks, MD;  Location: [place];  Service: Open Heart Surgery;  Laterality: N/A;  . TRANSCATHETER AORTIC VALVE REPLACEMENT, TRANSFEMORAL N/A 07/23/2017   Procedure: TRANSCATHETER AORTIC VALVE REPLACEMENT, TRANSFEMORAL;  Surgeon: Burnell Blanks, MD;  Location: [place];  Service: Open Heart Surgery;  Laterality: N/A;   HPI:  [name] a 78 y.o.malewith past medical history of severe aortic stenosis, coronary artery disease status post DES on aspirin and Plavix, chronic systolic heart failure, COPD, hypertension presents to the emergency room with left hemiplegia and right-sided gaze as a stroke alert. On arrival stat CT head showed large right basal ganglia hemorrhage. No chest imaging available at present. MRI with right basal ganglia intraparenchymal hematoma 5x3.2x4.3cm with surrounding edema, mild flattening of R lateral ventricle and right-to-left shift of 81mm   Assessment / Plan / Recommendation Clinical Impression   Patient presents with severe cognitive deficits and mild-moderate dysarthria with mild impacts on intelligibility at conversation level. Pt with impaired sustained attention, recall, awareness of deficits, reasoning, and problem solving. Left inattention noted. Pt scored 6/30 on MOCA-Basic, consistent with severe cognitive impairments. Pt pleasant but very tangential and easily distracted during assessment and functional tasks. Educated pt re: deficits with teach back; he requested opportunity to try again when confronted with errors, and appears motivated to improve.  Recommend skilled ST to maximize cognitive function and improve intelligibility. Would benefit from CIR. Will follow acutely.     SLP Assessment  SLP Recommendation/Assessment: Patient needs continued Speech Lanaguage Pathology Services SLP Visit Diagnosis: Cognitive communication deficit (R41.841);Dysarthria and anarthria (R47.1)    Follow Up Recommendations  Inpatient Rehab    Frequency and Duration min 2x/week  2 weeks      SLP Evaluation Cognition  Overall Cognitive Status: Impaired/Different from baseline Arousal/Alertness: Awake/alert Orientation Level: Oriented to person;Oriented to place;Oriented to situation;Disoriented to time Attention: Sustained Sustained Attention: Impaired Sustained Attention Impairment: Verbal basic;Functional basic(pt self-distracting in simple conversation) Memory: Impaired Memory Impairment: Other (comment)(immediate recall 3/5, delayed recall 0/5 even with catg cues) Awareness: Impaired Awareness Impairment: Intellectual impairment;Emergent impairment;Anticipatory impairment Problem Solving: Impaired Problem Solving Impairment: Verbal basic(slow processing) Executive Function: Reasoning Reasoning: Impaired Reasoning Impairment: Verbal basic(categorization 0/3) Behaviors: Impulsive Safety/Judgment: Impaired Comments: decreased awareness of deficits       Comprehension  Auditory Comprehension Overall Auditory Comprehension: Appears within functional limits for tasks assessed Visual Recognition/Discrimination Discrimination: Within Function Limits Reading Comprehension Reading Status: Not tested    Expression Expression Primary Mode of Expression: Verbal Verbal Expression Overall Verbal Expression: Appears within functional limits for tasks assessed Naming: (divergent naming impaired due to attention) Pragmatics: Impairment Impairments: Eye contact;Topic maintenance Interfering Components: Attention Written Expression Dominant Hand:  Right Written Expression: Not tested   Oral / Motor  Oral Motor/Sensory Function Overall Oral Motor/Sensory Function: Moderate impairment Facial ROM: Reduced left Facial Symmetry: Abnormal symmetry left Facial Strength: Reduced left Lingual ROM: Within Functional Limits Lingual Symmetry: Within Functional Limits Lingual Strength: Reduced Motor Speech Overall Motor Speech: Impaired Respiration: Within functional limits Phonation: Wet Resonance: Within functional limits Articulation: Impaired Level of Impairment: Sentence Intelligibility: Intelligibility reduced Conversation: 75-100% accurate(90% in conversation) Motor Planning: Witnin functional limits Motor Speech Errors: Consistent Effective Techniques: Increased vocal intensity   GO                   Deneise Lever, MS, [place] Pager: [phone] Office: [phone]  Aliene Altes 12/14/2018, 4:27 PM

## 2018-12-14 NOTE — Consult Note (Signed)
Physical Medicine and Rehabilitation Consult Reason for Consult: Left hemiparesis and functional deficits Referring Physician: Leonie Man   HPI: Edward Strickland is a 78 y.o. male with a history of CAD and severe aortic stenosis, COPD and hypertension who presented to the emergency room with left hemiparesis and right-sided gaze preference on December 12, 2018.  Blood pressure was elevated on arrival.  CT scan revealed large right basal ganglia hemorrhage.  Patient has continued to demonstrate lethargy and left-sided weakness.  He has been up initially with physical therapy.  Physical medicine and rehabilitation was asked to assess the patient for ongoing rehab needs.   Review of Systems  Unable to perform ROS: Mental acuity   Past Medical History:  Diagnosis Date  . Aortic stenosis, severe   . CAD (coronary artery disease)    a. 06/2017: s/p DES to LAD.   Marland Kitchen Chronic systolic CHF (congestive heart failure) ([place])   . COPD (chronic obstructive pulmonary disease) ([place])   . Hypertension   . S/P TAVR (transcatheter aortic valve replacement) 07/23/2017   29 mm Edwards Sapien 3 transcatheter heart valve placed via percutaneous right transfemoral approach   Past Surgical History:  Procedure Laterality Date  . CHEST TUBE INSERTION Right 07/23/2017   Procedure: INSERTION PLEURAL DRAINAGE CATHETER;  Surgeon: Rexene Alberts, MD;  Location: [facility];  Service: Thoracic;  Laterality: Right;  . CORONARY STENT INTERVENTION N/A 07/17/2017   Procedure: CORONARY STENT INTERVENTION;  Surgeon: Burnell Blanks, MD;  Location: [facility];  Service: Cardiovascular;  Laterality: N/A;  . IR THORACENTESIS ASP PLEURAL SPACE W/IMG GUIDE  07/11/2017  . MULTIPLE EXTRACTIONS WITH ALVEOLOPLASTY N/A 07/15/2017   Procedure: Extraction of tooth #'s 4,6,7,8,9,15, 20,21,22, 23 ,24,26,and 27 with alveoloplassty;  Surgeon: Lenn Cal, DDS;  [facility];  Service: Oral Surgery;  Laterality: N/A;  .  REMOVAL OF PLEURAL DRAINAGE CATHETER N/A 08/07/2017   Procedure: REMOVAL OF PLEURAL DRAINAGE CATHETER;  Surgeon: Rexene Alberts, MD;  Location: [facility];  Service: Thoracic;  Laterality: N/A;  . TEE WITHOUT CARDIOVERSION N/A 07/23/2017   Procedure: TRANSESOPHAGEAL ECHOCARDIOGRAM (TEE);  Surgeon: Burnell Blanks, MD;  Location: [facility];  Service: Open Heart Surgery;  Laterality: N/A;  . TRANSCATHETER AORTIC VALVE REPLACEMENT, TRANSFEMORAL N/A 07/23/2017   Procedure: TRANSCATHETER AORTIC VALVE REPLACEMENT, TRANSFEMORAL;  Surgeon: Burnell Blanks, MD;  Location: [facility];  Service: Open Heart Surgery;  Laterality: N/A;   Family History  Problem Relation Age of Onset  . Congestive Heart Failure Paternal Uncle    Social History:  reports that he has quit smoking. He has never used smokeless tobacco. He reports that he does not drink alcohol or use drugs. Allergies: No Known Allergies Medications Prior to Admission  Medication Sig Dispense Refill  . aspirin EC 81 MG tablet Take 81 mg by mouth daily.    Marland Kitchen atorvastatin (LIPITOR) 80 MG tablet Take 1 tablet (80 mg total) by mouth daily at 6 PM. 90 tablet 3  . carvedilol (COREG) 6.25 MG tablet Take 1 tablet (6.25 mg total) by mouth 2 (two) times daily with a meal. 180 tablet 1  . diphenhydrAMINE (BENADRYL) 25 MG tablet Take 25 mg by mouth every 8 (eight) hours as needed for itching.    Marland Kitchen ENTRESTO 24-26 MG Take 1 tablet by mouth 2 (two) times daily.  3  . furosemide (LASIX) 40 MG tablet Take 1 tablet (40 mg total) by mouth daily. 90 tablet 4  Home: Home Living Family/patient expects to be discharged to:: Private residence Living Arrangements: Alone Type of Home: House Home Access: Level entry [place]: One level Bathroom Shower/Tub: Tub/shower unit [place]: Environmental consultant - 2 wheels, [place] - single point Additional Comments: Information taken from prior admission - unsure if details are still correct    Functional History: Prior Function Level of Independence: Needs assistance Comments: Was relying on neighbor for assist with iADL's, and was independent with ADL's. Info again taken from prior admission.  Functional Status:  Mobility: Bed Mobility Overal bed mobility: Needs Assistance Bed Mobility: Supine to Sit, Sit to Supine Supine to sit: Max assist, +2 for physical assistance, HOB elevated Sit to supine: Total assist, +2 for safety/equipment General bed mobility comments: +2 assist required for transition to/from EOB. Pt with heavy L lateral lean in sitting, and with L hip drifting towards EOB 2 extensor tone in L knee and pt pushing posteriorly in trunk.  Transfers General transfer comment: Attempted sit>stand at least to reposition on EOB. Unable to safely attempt due to knee extension in LLE and diffculty following commands for anterior lean in trunk.       ADL:    Cognition: Cognition Overall Cognitive Status: Impaired/Different from baseline Orientation Level: Oriented to person, Oriented to place, Oriented to situation, Disoriented to time Cognition Arousal/Alertness: Awake/alert Behavior During Therapy: WFL for tasks assessed/performed Overall Cognitive Status: Impaired/Different from baseline Area of Impairment: Orientation, Attention, Memory, Following commands, Safety/judgement, Awareness, Problem solving Orientation Level: (Oriented x4) Current Attention Level: Sustained Memory: Decreased short-term memory Following Commands: Follows one step commands consistently, Follows one step commands with increased time, Follows multi-step commands inconsistently Safety/Judgement: Decreased awareness of safety, Decreased awareness of deficits Awareness: Intellectual Problem Solving: Slow processing, Decreased initiation, Difficulty sequencing, Requires verbal cues  Blood pressure (!) 128/57, pulse 86, temperature 98.4 F (36.9 C), temperature source Oral, resp. rate 16,  height 5\' 7"  (1.702 m), weight 55.4 kg, SpO2 96 %. Physical Exam  Constitutional:  Lethargic  HENT:  Head: Atraumatic.  Eyes: Right eye exhibits no discharge. Left eye exhibits no discharge.  Neck: Normal range of motion.  Cardiovascular: Normal rate.  Respiratory: Effort normal.  GI: Soft.  Genitourinary:    Penis normal.   Musculoskeletal:        General: No deformity.  Neurological:  Patient lethargic with eyes closed.  He does respond to verbal and tactile cues.  Initiate spontaneous movement with his right arm and leg.  Does does not demonstrate some withdrawal of the left arm and leg to pain provocation.  Skin: Skin is warm.  Psychiatric:  Flat    Results for orders placed or performed during the hospital encounter of 12/12/18 (from the past 24 hour(s))  Lipid panel     Status: Abnormal   Collection Time: 12/14/18 12:21 AM  Result Value Ref Range   Cholesterol 126 0 - 200 mg/dL   Triglycerides 190 (H) <150 mg/dL   HDL 32 (L) >40 mg/dL   Total CHOL/HDL Ratio 3.9 RATIO   VLDL 38 0 - 40 mg/dL   LDL Cholesterol 56 0 - 99 mg/dL   Ct Angio Head W Or Wo Contrast  Result Date: 12/12/2018 CLINICAL DATA:  Intracranial hemorrhage follow up EXAM: CT ANGIOGRAPHY HEAD TECHNIQUE: Multidetector CT imaging of the head was performed using the standard protocol during bolus administration of intravenous contrast. Multiplanar CT image reconstructions and MIPs were obtained to evaluate the vascular anatomy. CONTRAST:  21mL ISOVUE-370 IOPAMIDOL (  ISOVUE-370) INJECTION 76% COMPARISON:  Head CT 12/12/2018 FINDINGS: CTA HEAD FINDINGS POSTERIOR CIRCULATION: --Basilar artery: Normal. --Posterior cerebral arteries: Normal. Both originate from the basilar artery. --Superior cerebellar arteries: Normal. --Inferior cerebellar arteries: Normal anterior and posterior inferior cerebellar arteries. ANTERIOR CIRCULATION: --Intracranial internal carotid arteries: Atherosclerotic calcification of the internal  carotid arteries at the skull base without hemodynamically significant stenosis. --Anterior cerebral arteries: Normal. Both A1 segments are present. Patent anterior communicating artery. --Middle cerebral arteries: Normal. --Posterior communicating arteries: Absent bilaterally. Vertebral arteries: Left dominant. The right vertebral artery terminates in PICA. VENOUS SINUSES: As permitted by contrast timing, patent. ANATOMIC VARIANTS: None DELAYED PHASE: No parenchymal contrast enhancement. No CTA spot sign or arteriovenous malformation. Review of the MIP images confirms the above findings. IMPRESSION: 1. No aneurysm, arteriovenous malformation or other causative hemorrhagic lesion. The distribution of the hematoma is most consistent with hypertensive hemorrhage. 2. No intracranial arterial occlusion or hemodynamically significant stenosis. Electronically Signed   By: Ulyses Jarred M.D.   On: 12/12/2018 20:14   Mr Brain Wo Contrast  Result Date: 12/13/2018 CLINICAL DATA:  History of dementia. Code stroke presentation yesterday with intracranial hemorrhage. EXAM: MRI HEAD WITHOUT CONTRAST TECHNIQUE: Multiplanar, multiecho pulse sequences of the brain and surrounding structures were obtained without intravenous contrast. COMPARISON:  Head CT 12/12/2018 FINDINGS: Brain: Redemonstration an intraparenchymal hemorrhage in the right basal ganglia region measuring approximately 5 x 3.2 x 4.3 cm. Surrounding edema. Mild mass effect with some flattening of the right lateral ventricle and right-to-left shift of 3 mm. There does not appear to have been additional bleeding since yesterday. There is a tiny amount of blood layering in the occipital horns of the lateral ventricles. The brain elsewhere shows only mild chronic small-vessel change of the white matter. No sign of an underlying mass or vascular malformation. No obstructive hydrocephalus. No extra-axial collection. Vascular: Major vessels at the base of the brain show  flow. Skull and upper cervical spine: Negative Sinuses/Orbits: Clear/normal Other: None IMPRESSION: No evidence of additional bleeding related to intraparenchymal hematoma with its epicenter in the right basal ganglia region, measuring approximately 5 x 3.2 x 4.3 cm, with surrounding edema. Mild flattening of the right lateral ventricle and right-to-left shift of 3 mm. No sign of underlying neoplastic or vascular lesion. Electronically Signed   By: Nelson Chimes M.D.   On: 12/13/2018 11:51   Ct Head Code Stroke Wo Contrast  Result Date: 12/12/2018 CLINICAL DATA:  Code stroke. EXAM: CT HEAD WITHOUT CONTRAST TECHNIQUE: Contiguous axial images were obtained from the base of the skull through the vertex without intravenous contrast. COMPARISON:  None. FINDINGS: Brain: Acute hemorrhage within the right basal ganglia measuring 4.9 x 3.5 x 4.2 cm (volume = 38 cm^3)(AP x ML x CC). Surrounding edema and local mass effect partially effaces the right lateral ventricle and results in 1 mm of right-to-left midline shift. No extra-axial collection or hydrocephalus. No additional focus of stroke or hemorrhage identified. Nonspecific white matter hypodensities are compatible with chronic microvascular ischemic changes and there is volume loss of the brain. Vascular: Calcific atherosclerosis of carotid siphons and vertebral arteries. No hyperdense vessel identified. Skull: Normal. Negative for fracture or focal lesion. Sinuses/Orbits: No acute finding. Other: None. ASPECTS [facility] Stroke Program Early CT Score) - Ganglionic level infarction (caudate, lentiform nuclei, internal capsule, insula, M1-M3 cortex): 7 - Supraganglionic infarction (M4-M6 cortex): 3 Total score (0-10 with 10 being normal): 10 IMPRESSION: 1. Acute hemorrhage in the right basal ganglia measuring up to 4.9 cm, 38  cc. Associate edema and local mass effect partially effaces right lateral ventricle and results in 1 mm right-to-left midline shift. 2. ASPECTS is  not applicable. 3. Chronic microvascular ischemic changes and volume loss of the brain. Critical Value/emergent results were called by telephone at the time of interpretation on 12/12/2018 at 6:43 pm to Dr. Venora Maples, who verbally acknowledged these results. Electronically Signed   By: Kristine Garbe M.D.   On: 12/12/2018 18:44   Vas US Carotid  Result Date: 12/13/2018 Carotid Arterial Duplex Study Indications:       CVA and Stroke. Limitations:       Labored breathing, constantly involuntary movement,                    trembling. Stiffness of neck. Comparison Study:  Carotid duplex on 07/13/2017. Performing Technologist: Rudell Cobb  Examination Guidelines: A complete evaluation includes B-mode imaging, spectral Doppler, color Doppler, and power Doppler as needed of all accessible portions of each vessel. Bilateral testing is considered an integral part of a complete examination. Limited examinations for reoccurring indications may be performed as noted.  Right Carotid Findings: +----------+--------+--------+--------+-------------------------------+--------+           PSV cm/sEDV cm/sStenosisDescribe                       Comments +----------+--------+--------+--------+-------------------------------+--------+ CCA Prox  127     17              diffuse and hypoechoic         tortuous +----------+--------+--------+--------+-------------------------------+--------+ CCA Distal69      14                                                      +----------+--------+--------+--------+-------------------------------+--------+ ICA Prox  84      21      1-39%   focal, calcific and                                                       heterogenous                            +----------+--------+--------+--------+-------------------------------+--------+ ICA Distal82      24                                                       +----------+--------+--------+--------+-------------------------------+--------+ ECA       267     34      >50%    hyperechoic and diffuse                 +----------+--------+--------+--------+-------------------------------+--------+ +----------+--------+-------+------------------------+-------------------+           PSV cm/sEDV cmsDescribe                Arm Pressure (mmHG) +----------+--------+-------+------------------------+-------------------+ [id number]             heterogenous plaque seen                    +----------+--------+-------+------------------------+-------------------+ +---------+--------+--+--------+--+---------+  VertebralPSV cm/s63EDV cm/s14Antegrade +---------+--------+--+--------+--+---------+  Left Carotid Findings: +----------+--------+--------+--------+--------------------+-------------------+           PSV cm/sEDV cm/sStenosisDescribe            Comments            +----------+--------+--------+--------+--------------------+-------------------+ CCA Prox  109     11              diffuse and         tortuous                                              hypoechoic                              +----------+--------+--------+--------+--------------------+-------------------+ CCA Distal135     15              diffuse and                                                               hypoechoic                              +----------+--------+--------+--------+--------------------+-------------------+ ICA Prox  120     31      1-39%   diffuse and         borderline of                                         heterogenous        40-59% stenosis.    +----------+--------+--------+--------+--------------------+-------------------+ ICA Distal85      19                                                      +----------+--------+--------+--------+--------------------+-------------------+ ECA       178     16                                                       +----------+--------+--------+--------+--------------------+-------------------+ +----------+--------+--------+-----------------+-------------------+ SubclavianPSV cm/sEDV cm/sDescribe         Arm Pressure (mmHG) +----------+--------+--------+-----------------+-------------------+           313             Elevated velocity                    +----------+--------+--------+-----------------+-------------------+ +---------+--------+--+--------+--+---------+ VertebralPSV cm/s43EDV cm/s18Antegrade +---------+--------+--+--------+--+---------+  Summary: Right Carotid: Velocities in the right ICA are consistent with a 1-39% stenosis. Left Carotid: Velocities in the left ICA are consistent with a 1-39% stenosis. Vertebrals: Bilateral vertebral arteries demonstrate antegrade flow. *See table(s) above for measurements and observations.     Preliminary  Assessment/Plan: Diagnosis: Large right basal ganglia hemorrhage with left hemiparesis and associated cognitive and visual spatial deficits 1. Does the need for close, 24 hr/day medical supervision in concert with the patient's rehab needs make it unreasonable for this patient to be served in a less intensive setting? Yes 2. Co-Morbidities requiring supervision/potential complications: Aortic stenosis, coronary artery disease, chronic systolic congestive heart failure, COPD, hypertension 3. Due to bladder management, bowel management, safety, skin/wound care, disease management, medication administration, pain management and patient education, does the patient require 24 hr/day rehab nursing? Yes 4. Does the patient require coordinated care of a physician, rehab nurse, PT (1-2 hrs/day, 5 days/week), OT (1-2 hrs/day, 5 days/week) and SLP (1-2 hrs/day, 5 days/week) to address physical and functional deficits in the context of the above medical diagnosis(es)? Yes Addressing deficits in the following  areas: balance, endurance, locomotion, strength, transferring, bowel/bladder control, bathing, dressing, feeding, grooming, toileting, cognition, speech, swallowing and psychosocial support 5. Can the patient actively participate in an intensive therapy program of at least 3 hrs of therapy per day at least 5 days per week? Yes and Potentially 6. The potential for patient to make measurable gains while on inpatient rehab is good 7. Anticipated functional outcomes upon discharge from inpatient rehab are min assist  with PT, min assist with OT, supervision and min assist with SLP. 8. Estimated rehab length of stay to reach the above functional goals is: 18-27 days 9. Anticipated D/C setting: Home 10. Anticipated post D/C treatments: HH therapy and Outpatient therapy 11. Overall Rehab/Functional Prognosis: good  RECOMMENDATIONS: This patient's condition is appropriate for continued rehabilitative care in the following setting: CIR Patient has agreed to participate in recommended program. N/A Note that insurance prior authorization may be required for reimbursement for recommended care.  Comment: Rehab Admissions Coordinator to follow up.  Thanks,  Meredith Staggers, MD, Mellody Drown  I have personally performed a face to face diagnostic evaluation of this patient. Additionally, I have examined pertinent labs and radiographic images.   Meredith Staggers, MD 12/14/2018

## 2018-12-14 NOTE — Progress Notes (Signed)
New Admission Note:  Arrival Method: From 4NICU Mental Orientation: oriented to self, location and situation IV:R forearm saline locked  Pain:0/10 Safety Measures: Safety Fall Prevention Plan was given, discussed. 4Z66: Patient has been orientated to the room, unit and the staff.   Orders have been reviewed and implemented. Will continue to monitor the patient. Call light has been placed within reach and bed alarm has been activated.   Arta Silence ,RN

## 2018-12-14 NOTE — Progress Notes (Signed)
  Speech Language Pathology Treatment: Dysphagia  Patient Details Name: Edward Strickland MRN: [id number] DOB: [birth year] Today's Date: 12/14/2018 Time: 4098-1191 SLP Time Calculation (min) (ACUTE ONLY): 12 min  Assessment / Plan / Recommendation Clinical Impression  Pt seen at bedside for diet tolerance, as unable to complete MBS this date due to fluoro schedule. RN reports pt tolerating dys 1, nectar thick liquids well. He has been afebrile and lung sounds clear per chart review. Pt with right gaze preference, voice is somewhat wet at baseline and intermittently even when not consuming POs. Cued throat clear/reswallow clears vocal quality. Pt required mod tactile assist to limit bolus size and rate of intake; poor awareness of left anterior spillage. Occasional cues necessary for subsequent swallow to clear oral residue. No coughing noted with intake. As vocal quality is wet and pt had signs of aspiration with thin liquids during bedside assessment yesterday, recommend MBS next date for objective evaluation of swallow function to aid in determining safest diet. Continue dys 1, nectar thick liquids, meds crushed pending MBS.     HPI HPI: Edward Strickland a 78 y.o.malewith past medical history of severe aortic stenosis, coronary artery disease status post DES on aspirin and Plavix, chronic systolic heart failure, COPD, hypertension presents to the emergency room with left hemiplegia and right-sided gaze as a stroke alert. On arrival stat CT head showed large right basal ganglia hemorrhage. No chest imaging available at present. MRI with right basal ganglia intraparenchymal hematoma 5x3.2x4.3cm with surrounding edema, mild flattening of R lateral ventricle and right-to-left shift of 65mm      SLP Plan  MBS       Recommendations  Diet recommendations: Nectar-thick liquid;Dysphagia 1 (puree) Liquids provided via: Cup Medication Administration: Crushed with puree Supervision: Full supervision/cueing  for compensatory strategies Compensations: Slow rate;Small sips/bites;Clear throat intermittently                Oral Care Recommendations: Oral care BID Follow up Recommendations: Other (comment)(tbd) SLP Visit Diagnosis: Dysphagia, oropharyngeal phase (R13.12) Plan: MBS       GO               Deneise Lever, [name], CCC-SLP Speech-Language Pathologist Acute Rehabilitation Services Pager: [phone] Office: [phone]  Aliene Altes 12/14/2018, 4:17 PM

## 2018-12-15 ENCOUNTER — Inpatient Hospital Stay (HOSPITAL_COMMUNITY): Payer: Medicare Other

## 2018-12-15 DIAGNOSIS — I639 Cerebral infarction, unspecified: Secondary | ICD-10-CM

## 2018-12-15 LAB — ECHOCARDIOGRAM COMPLETE
Height: 67 in
Weight: 1954.16 oz

## 2018-12-15 MED ORDER — VITAMIN B-1 100 MG PO TABS
100.0000 mg | ORAL_TABLET | Freq: Every day | ORAL | Status: DC
  Administered 2018-12-15 – 2018-12-16 (×2): 100 mg via ORAL
  Filled 2018-12-15 (×2): qty 1

## 2018-12-15 MED ORDER — RESOURCE THICKENUP CLEAR PO POWD
ORAL | Status: DC | PRN
  Filled 2018-12-15: qty 125

## 2018-12-15 NOTE — Progress Notes (Addendum)
STROKE TEAM PROGRESS NOTE   SUBJECTIVE (INTERVAL HISTORY) His RN is at the bedside. Just back from [facility] where pureed nectar diet recommended.   OBJECTIVE Vitals:   12/14/18 2100 12/15/18 0000 12/15/18 0335 12/15/18 0745  BP: 117/65 (!) 111/52 (!) 127/50 119/61  Pulse: 75 74 66 65  Resp: 16 20 18 18   Temp: 100 F (37.8 C) 99.3 F (37.4 C) 98.9 F (37.2 C) 97.9 F (36.6 C)  TempSrc: Oral Oral Oral Oral  SpO2: 96% 94% 96% 97%  Weight:      Height:        CBC:  Recent Labs  Lab 12/12/18 1822  WBC 12.5*  NEUTROABS 10.6*  HGB 13.8  HCT 42.1  MCV 90.0  PLT 762    Basic Metabolic Panel:  Recent Labs  Lab 12/12/18 1822 12/12/18 1832  NA 138  --   K 4.2  --   CL 103  --   CO2 24  --   GLUCOSE 134*  --   BUN 15  --   CREATININE 1.32* 1.30*  CALCIUM 9.1  --     Lipid Panel:     Component Value Date/Time   CHOL 126 12/14/2018 0021   TRIG 190 (H) 12/14/2018 0021   HDL 32 (L) 12/14/2018 0021   CHOLHDL 3.9 12/14/2018 0021   VLDL 38 12/14/2018 0021   LDLCALC 56 12/14/2018 0021   HgbA1c: No results found for: HGBA1C Urine Drug Screen:     Component Value Date/Time   LABOPIA NONE DETECTED 12/13/2018 0925   COCAINSCRNUR NONE DETECTED 12/13/2018 0925   LABBENZ NONE DETECTED 12/13/2018 0925   AMPHETMU NONE DETECTED 12/13/2018 0925   THCU NONE DETECTED 12/13/2018 0925   LABBARB NONE DETECTED 12/13/2018 0925    Alcohol Level     Component Value Date/Time   ETH <10 12/12/2018 1822    IMAGING Ct Head Code Stroke Wo Contrast 12/12/2018 IMPRESSION:  1. Acute hemorrhage in the right basal ganglia measuring up to 4.9 cm, 38 cc. Associate edema and local mass effect partially effaces right lateral ventricle and results in 1 mm right-to-left midline shift.  2. ASPECTS is not applicable.  3. Chronic microvascular ischemic changes and volume loss of the brain.   Ct Angio Head W Or Wo Contrast 12/12/2018 IMPRESSION:  1. No aneurysm, arteriovenous malformation or  other causative hemorrhagic lesion. The distribution of the hematoma is most consistent with hypertensive hemorrhage.  2. No intracranial arterial occlusion or hemodynamically significant stenosis.   MRI brain 12/13/2018  No evidence of additional bleeding related to intraparenchymal hematoma with its epicenter in the right basal ganglia region, measuring approximately 5 x 3.2 x 4.3 cm, with surrounding edema. Mild flattening of the right lateral ventricle and right-to-left shift of 3 mm. No sign of underlying neoplastic or vascular lesion.  Transthoracic Echocardiogram pending  Bilateral Carotid Dopplers - bilateral 1-39 percent carotid stenosis   PHYSICAL EXAM Frail unkempt malnourished looking elderly male not in distress. Afebrile. Head is nontraumatic. Neck is supple without bruit.    Cardiac exam no murmur or gallop. Lungs are clear to auscultation. Distal pulses are well felt. Neurological Exam :  He is awake and alert oriented 2. Diminished attention, registration and recall. Poor insight into his condition.mild dysarthria. No aphasia. Follows simple commands well. Right gaze preference. Will not look to the left.  . Blinks to threat on the right but not on the left. Left lower face weakness. Tongue midline. Left hemiplegia 1-2/5 left upper extremity  and 2/5 left lower extremity. Increased tone in the left lower extremity.left plantar upgoing right downgoing. Gait not tested   ASSESSMENT/PLAN [name] is a 78 y.o. male with history ofsevere aortic stenosis, coronary artery disease status post DES on aspirin and Plavix, chronic systolic heart failure, COPD, hypertension  presenting with sudden onset slurred speech and subsequent fall.  He did not receive IV t-PA due to [place].  Large right basal ganglia hemorrhage.   Resultant  Left hemiplegia  CT head - Acute hemorrhage in the right basal ganglia measuring up to 4.9 cm, 38 cc. Associate edema and local mass effect partially  effaces right lateral ventricle and results in 1 mm right-to-left midline shift.   MRI head -5 x 3.0 x 4.3 cm right basal ganglia hemorrhage with mild surrounding edema and mass effect on right lateral ventricle with 3 mm shift but no hydrocephalus.  MRA head - See CT angiogram head  CTA Head -  No aneurysm, arteriovenous malformation or other causative hemorrhagic lesion. The distribution of the hematoma is most consistent with hypertensive hemorrhage.  Carotid Doppler - 1-39 percent bilateral stenosis  2D Echo - pending  LDL - 56  HgbA1c - pending  UDS - negative  VTE prophylaxis - SCDs  Diet - pureed nectar thick liquids  aspirin 81 mg daily prior to admission, now on No antithrombotic given hmg  Therapy recommendations:  CLR  Disposition:  Pending  Hypertension  Needed with Cleviprex in ICU  Now stable . Long-term BP goal normotensive  Hyperlipidemia  Lipid lowering medication PTA: Lipitor 80 mg daily  LDL 56, goal < 70  Current lipid lowering medication: none  Continue statin at discharge  Other Stroke Risk Factors  Advanced age  Former cigarette smoker - quit  Coronary artery disease  Other Active Problems  Creatinine - 1.30  Leukocytosis - 12.5 (afebrile) recheck in am  Hospital day # [place], MSN, APRN, ANVP-BC, AGPCNP-BC Advanced Practice Stroke Nurse [place] for Schedule & Pager information 12/15/2018 3:17 PM  I have personally obtained history,examined this patient, reviewed notes, independently viewed imaging studies, participated in medical decision making and plan of care.ROS completed by me personally and pertinent positives fully documented  I have made any additions or clarifications directly to the above note. Agree with note above.  Antony Contras, MD Medical Director [facility] Pager: [phone] 12/15/2018 4:49 PM  To contact Stroke Continuity provider, please refer to  [email or web address] After hours, contact General Neurology

## 2018-12-15 NOTE — NC FL2 (Addendum)
[place] LEVEL OF CARE SCREENING TOOL     IDENTIFICATION  Patient Name: Edward Strickland Birthdate: 05/26/1941 Sex: male Admission Date (Current Location): 12/12/2018  [facility] and [place] Number:  [employer] and Address:  [facility], [place] [address] [place], [place] [address]      Provider Number: [id number]  Attending Physician Name and Address:  Garvin Fila, MD  Relative Name and Phone Number:  [name], friend, [phone]    Current Level of Care: Hospital Recommended Level of Care: [place] Prior Approval Number:    Date Approved/Denied:   PASRR Number: [id number]  Discharge Plan: SNF    Current Diagnoses: Patient Active Problem List   Diagnosis Date Noted  . ICH (intracerebral hemorrhage) ([place]) 12/12/2018  . S/P TAVR (transcatheter aortic valve replacement) 07/23/2017  . Coronary artery disease involving native coronary artery of native heart with unstable angina pectoris ([place])   . Pleural effusion   . Acute on chronic systolic CHF (congestive heart failure) ([place]) 07/09/2017  . Aortic stenosis 07/09/2017  . CKD (chronic kidney disease), stage III ([place]) 07/09/2017  . Essential hypertension 07/09/2017  . COPD (chronic obstructive pulmonary disease) ([place]) 07/09/2017    Orientation RESPIRATION BLADDER Height & Weight     Self, Situation, Place  Normal Continent, External catheter Weight: 55.4 kg Height:  5\' 7"  (170.2 cm)  BEHAVIORAL SYMPTOMS/MOOD NEUROLOGICAL BOWEL NUTRITION STATUS      Continent Diet(Please see DC Summary)  AMBULATORY STATUS COMMUNICATION OF NEEDS Skin   Extensive Assist Verbally Normal                       Personal Care Assistance Level of Assistance  Bathing, Feeding, Dressing Bathing Assistance: Maximum assistance Feeding assistance: Limited assistance Dressing Assistance: Limited assistance     Functional Limitations Info  Sight, Hearing, Speech Sight Info:  Adequate Hearing Info: Adequate Speech Info: Adequate    SPECIAL CARE FACTORS FREQUENCY  PT (By licensed PT), OT (By licensed OT)     PT Frequency: 5x/week OT Frequency: 3x/week            Contractures Contractures Info: Not present    Additional Factors Info  Code Status, Allergies Code Status Info: Full Allergies Info: NKA           Current Medications (12/15/2018):  This is the current hospital active medication list Current Facility-Administered Medications  Medication Dose Route Frequency Provider Last Rate Last Dose  . 0.9 %  sodium chloride infusion   Intravenous PRN Garvin Fila, MD   Stopped at 12/13/18 [phone]  . MEDLINE mouth rinse  15 mL Mouth Rinse BID Garvin Fila, MD   15 mL at 12/15/18 1110  . pantoprazole (PROTONIX) injection 40 mg  40 mg Intravenous QHS Garvin Fila, MD   40 mg at 12/14/18 2231  . RESOURCE THICKENUP CLEAR   Oral PRN Garvin Fila, MD      . senna-docusate (Senokot-S) tablet 1 tablet  1 tablet Oral BID Garvin Fila, MD   1 tablet at 12/15/18 1111  . thiamine (VITAMIN B-1) tablet 100 mg  100 mg Oral Daily Karren Cobble, RPH   100 mg at 12/15/18 1111     Discharge Medications: Please see discharge summary for a list of discharge medications.  Relevant Imaging Results:  Relevant Lab Results:   Additional Information SS#: [id number]  Lissa Morales Rayyan, LCSW  I have personally obtained history,examined  this patient, reviewed notes, independently viewed imaging studies, participated in medical decision making and plan of care.ROS completed by me personally and pertinent positives fully documented  I have made any additions or clarifications directly to the above note. Agree with note above.    Antony Contras, MD Medical Director [facility] Pager: [phone] 12/15/2018 3:28 PM

## 2018-12-15 NOTE — Care Management Important Message (Signed)
Important Message  Patient Details  Name: Edward Strickland MRN: [id number] Date of Birth: 07/05/1941   Medicare Important Message Given:  Yes    Piedad Standiford [name] 12/15/2018, 3:25 PM

## 2018-12-15 NOTE — Progress Notes (Signed)
Physical Therapy Treatment Patient Details Name: Edward Strickland MRN: [id number] DOB: June 25, 1941 Today's Date: 12/15/2018    History of Present Illness Pt is a 78 y/o male with a PMH significant for aortic stenosis, CAD s/p DES, TAVR 5400, chronic systolic heart failure, COPD, HTN. He presents to the ED with L-sided weakness and R-sided gaze. After sudden onset of symptoms and fall at home, friend waited 3 hours to alert EMS. Upon arrival to ED, stat CT revealed large R basal ganglia hemorrhage.     PT Comments    Pt alert and eager to participate, but pt is unable to use his left side to assist. Pt's L LE has significantly increased tone which first needed to be reduced before mobility.   Emphasis was on transition to EOB, sitting balance, sit to stand with/without AD, and a transfer to the chair.    Follow Up Recommendations  Supervision/Assistance - 24 hour;SNF     Equipment Recommendations  Other (comment)(TBA)    Recommendations for Other Services       Precautions / Restrictions Precautions Precautions: Fall Precaution Comments: increased tone in LLE Restrictions Weight Bearing Restrictions: No    Mobility  Bed Mobility Overal bed mobility: Needs Assistance Bed Mobility: Supine to Sit     Supine to sit: Max assist;+2 for physical assistance     General bed mobility comments: pt attempts assist with R UE, but the line of action is uncoordinated an not very helpful to come up or scoot.  Transfers Overall transfer level: Needs assistance   Transfers: Sit to/from Stand;Stand Pivot Transfers Sit to Stand: Max assist;Total assist;+2 physical assistance Stand pivot transfers: Max assist;+2 physical assistance       General transfer comment: pt needed significant assist to both come forward and boost.  all this after R LE range to decrease extensor tone.  Ambulation/Gait                 Stairs             Wheelchair Mobility    Modified Rankin  (Stroke Patients Only) Modified Rankin (Stroke Patients Only) Pre-Morbid Rankin Score: Moderate disability Modified Rankin: Severe disability     Balance Overall balance assessment: Needs assistance Sitting-balance support: Feet supported;Single extremity supported Sitting balance-Leahy Scale: Poor Sitting balance - Comments: max   Standing balance support: Bilateral upper extremity supported Standing balance-Leahy Scale: Zero                              Cognition Arousal/Alertness: Awake/alert Behavior During Therapy: WFL for tasks assessed/performed Overall Cognitive Status: Impaired/Different from baseline Area of Impairment: Attention;Memory;Following commands;Safety/judgement;Awareness;Problem solving                 Orientation Level: Place;Time;Situation Current Attention Level: Sustained Memory: Decreased short-term memory Following Commands: Follows one step commands with increased time;Follows one step commands consistently Safety/Judgement: Decreased awareness of safety;Decreased awareness of deficits Awareness: Intellectual Problem Solving: Slow processing;Decreased initiation;Difficulty sequencing;Requires verbal cues        Exercises      General Comments        Pertinent Vitals/Pain Pain Assessment: Faces Faces Pain Scale: Hurts little more Pain Location: Left hip pain after PROM with attempts to break up the tone Pain Descriptors / Indicators: Aching Pain Intervention(s): Monitored during session;Limited activity within patient's tolerance    Home Living  Prior Function            PT Goals (current goals can now be found in the care plan section) Acute Rehab PT Goals Patient Stated Goal: no goals stated PT Goal Formulation: Patient unable to participate in goal setting Time For Goal Achievement: 12/27/18 Potential to Achieve Goals: Fair Progress towards PT goals: Progressing toward goals(expect  slow progress)    Frequency    Min 3X/week      PT Plan Discharge plan needs to be updated    Co-evaluation              AM-PAC PT "6 Clicks" Mobility   Outcome Measure  Help needed turning from your back to your side while in a flat bed without using bedrails?: Total Help needed moving from lying on your back to sitting on the side of a flat bed without using bedrails?: Total Help needed moving to and from a bed to a chair (including a wheelchair)?: Total Help needed standing up from a chair using your arms (e.g., wheelchair or bedside chair)?: Total Help needed to walk in hospital room?: Total Help needed climbing 3-5 steps with a railing? : Total 6 Click Score: 6    End of Session Equipment Utilized During Treatment: Gait belt Activity Tolerance: Patient tolerated treatment well Patient left: in chair;with call bell/phone within reach;with chair alarm set Nurse Communication: Mobility status PT Visit Diagnosis: Hemiplegia and hemiparesis;Other symptoms and signs involving the nervous system (R29.898) Hemiplegia - Right/Left: Left Hemiplegia - caused by: Nontraumatic intracerebral hemorrhage     Time: 1425-1448 PT Time Calculation (min) (ACUTE ONLY): 23 min  Charges:  $Therapeutic Activity: 8-22 mins $Neuromuscular Re-education: 8-22 mins                     12/15/2018  Donnella Sham, PT [place] [phone]  (pager) [phone]  (office)   Tessie Fass Zia Najera 12/15/2018, 4:13 PM

## 2018-12-15 NOTE — Consult Note (Signed)
Modified Barium Swallow Progress Note  Patient Details  Name: Edward Strickland MRN: [id number] Date of Birth: May 31, 1941  Today's Date: 12/15/2018  Modified Barium Swallow completed.  Full report located under Chart Review in the Imaging Section.  Brief recommendations include the following:  Clinical Impression Pt presents with mild oral, mod-severe pharyngeal dysphagia, characterized as follows:   Orally, pt exhibits poor bolus formation and premature spillage across consistencies. Pt was unable to demonstrate ability to chew solid during this study. While no left side pocketing was noted during this study, risk for this is significant given pt inattention and left neglect. Care should be taken throuthout meals to check for oral residue, and make sure it is cleared before providing additional boluses.   Pharyngeally, pt exhibits significantly delayed swallow reflex, with trigger noted at the pyriform sinus on thin and nectar thick liquids, at the lateral channels on honey thick liquid, and at the vallecular sinus on puree. Poor airway closure results in penetration and trace silent aspiration of thin and nectar thick liquids. Cued cough/throat clear were partially effective. Pt exhibited wet voice quality intermittently throughout the study, and required cues to clear his throat or cough to clear. Small boluses of honey thick liquid and puree consistency were not penetrated or aspirated. There was insignificant pharyngeal residue following the swallow.  At this time, will continue puree solids, and downgrade liquids to honey thick to mitigate aspiration risk. Meds should be crushed. 1:1 supervision/assistance is recommended during meals, due to pt difficulty with self-feeding, left neglect, and need for ongoing cues to insure adherence to safe swallow precautions. SLP will continue to follow to assess diet tolerance and provide education.   Swallow Evaluation Recommendations  SLP Diet  Recommendations: Dysphagia 1 (Puree) solids   Liquid Administration via: Cup;Spoon;No straw   Medication Administration: Crushed with puree   Supervision: Full assist for feeding;Full supervision/cueing for compensatory strategies   Compensations: Minimize environmental distractions;Slow rate;Small sips/bites;Lingual sweep for clearance of pocketing   Postural Changes: Remain semi-upright after after feeds/meals (Comment);Seated upright at 90 degrees   Oral Care Recommendations: Oral care QID;Staff/trained caregiver to provide oral care   Other Recommendations: Order thickener from [place]. Quentin Ore, [facility], [place] Speech Language Pathologist [phone]  Shonna Chock 12/15/2018,10:17 AM

## 2018-12-15 NOTE — Plan of Care (Signed)
  Problem: Health Behavior/Discharge Planning: Goal: Ability to manage health-related needs will improve Outcome: Progressing   Problem: Clinical Measurements: Goal: Ability to maintain clinical measurements within normal limits will improve 12/15/2018 0543 by Marcos Eke, RN Outcome: Progressing 12/15/2018 0231 by Carin Hock I, RN Outcome: Progressing   Problem: Clinical Measurements: Goal: Will remain free from infection Outcome: Progressing   Problem: Clinical Measurements: Goal: Diagnostic test results will improve Outcome: Progressing

## 2018-12-15 NOTE — Progress Notes (Signed)
  Echocardiogram 2D Echocardiogram has been performed.  Hosea Hanawalt T Andreia Gandolfi 12/15/2018, 10:13 AM

## 2018-12-15 NOTE — Care Management Important Message (Signed)
Important Message  Patient Details  Name: Edward Strickland MRN: [id number] Date of Birth: 1941/08/08   Medicare Important Message Given:  Yes    Danish Ruffins [name] 12/15/2018, 3:35 PM

## 2018-12-15 NOTE — Progress Notes (Addendum)
Inpatient Rehabilitation Admissions Coordinator  I met with patient at bedside. He gave me permission to contact his friend, [name], to discuss his rehab needs. I have left her a message and will follow up after speaking with her.  Danne Baxter, RN, MSN Rehab Admissions Coordinator [phone] 12/15/2018 12:06 PM   Pt's friend, [name], returned my call. She was unaware of his location until I contacted her with his room number.Pt has no caregiver support which he will need at this time. He was Mod I with cane and driving pta. Lives in a building that [name] owns. Has electricity, but no water. She is requesting he receive his rehab at [facility] for he was there 2 years ago. He will need likely long term SNF/ ALF. I have alerted RN CM and SW. We will sign off at this time.  Danne Baxter, RN, MSN Rehab Admissions Coordinator [phone] 12/15/2018 12:46 PM

## 2018-12-15 NOTE — Evaluation (Signed)
Occupational Therapy Evaluation Patient Details Name: Edward Strickland MRN: [id number] DOB: [birth year] Today's Date: 12/15/2018    History of Present Illness Pt is a 78 y/o male with a PMH significant for aortic stenosis, CAD s/p DES, TAVR 8144, chronic systolic heart failure, COPD, HTN. He presents to the ED with L-sided weakness and R-sided gaze. After sudden onset of symptoms and fall at home, friend waited 3 hours to alert EMS. Upon arrival to ED, stat CT revealed large R basal ganglia hemorrhage.    Clinical Impression   Patient presenting with decreased I in self care, balance, functional mobility, L neglect, L lateral lean in sitting, and increased extensor tone in L knee.  Patient reports being mod I with ADLs prior to admission. Patient currently functioning at total - +2 for safety with mobility and functional tasks. Patient will benefit from acute OT to increase overall independence in the areas of ADLs, functional mobility, and safety awareness in order to safely discharge to next venue of care.    Follow Up Recommendations  CIR    Equipment Recommendations  Other (comment)(defer to next venue of care)    Recommendations for Other Services       Precautions / Restrictions Precautions Precautions: Fall Precaution Comments: increased tone in LLE Restrictions Weight Bearing Restrictions: No      Mobility Bed Mobility Overal bed mobility: Needs Assistance Bed Mobility: Supine to Sit;Sit to Supine     Supine to sit: Max assist;+2 for physical assistance;HOB elevated Sit to supine: Total assist;+2 for safety/equipment   General bed mobility comments: +2 assist required for transition to/from EOB. Pt with heavy L lateral lean in sitting  Transfers   General transfer comment: Attempted sit>stand at least to reposition on EOB. Unable to safely attempt due to knee extension in LLE and diffculty following commands for anterior lean in trunk.     Balance Overall balance  assessment: Needs assistance Sitting-balance support: Feet supported;Single extremity supported   Sitting balance - Comments: Max assist - total A for upright posture         ADL either performed or assessed with clinical judgement   ADL Overall ADL's : Needs assistance/impaired     Grooming: Maximal assistance;Sitting   Upper Body Bathing: Maximal assistance;Sitting   Lower Body Bathing: Total assistance;Bed level   Upper Body Dressing : Total assistance   Lower Body Dressing: Total assistance             Vision Patient Visual Report: No change from baseline Vision Assessment?: Vision impaired- to be further tested in functional context Additional Comments: L neglect            Pertinent Vitals/Pain Pain Assessment: Faces Faces Pain Scale: No hurt     Hand Dominance Right   Extremity/Trunk Assessment Upper Extremity Assessment Upper Extremity Assessment: LUE deficits/detail LUE Deficits / Details: flaccid LUE Coordination: decreased fine motor;decreased gross motor   Lower Extremity Assessment LLE Deficits / Details: Increased tone in knee extensors LLE Coordination: decreased fine motor;decreased gross motor   Cervical / Trunk Assessment Cervical / Trunk Assessment: Other exceptions Cervical / Trunk Exceptions: Forward head posture with rounded shoulders   Communication Communication Communication: No difficulties   Cognition Arousal/Alertness: Awake/alert Behavior During Therapy: WFL for tasks assessed/performed Overall Cognitive Status: Impaired/Different from baseline Area of Impairment: Attention;Memory;Following commands;Safety/judgement;Awareness;Problem solving    Orientation Level: Person;Place;Time;Situation Current Attention Level: Sustained Memory: Decreased short-term memory Following Commands: Follows one step commands consistently;Follows one step commands with increased time;Follows multi-step commands  inconsistently Safety/Judgement: Decreased awareness of safety;Decreased awareness of deficits Awareness: Intellectual Problem Solving: Slow processing;Decreased initiation;Difficulty sequencing;Requires verbal cues                Home Living Family/patient expects to be discharged to:: Private residence Living Arrangements: Alone   Type of Home: House Home Access: Level entry     Home Layout: One level     Bathroom Shower/Tub: Teacher, early years/pre: Standard     Home Equipment: Environmental consultant - 2 wheels;Cane - single point   Additional Comments: Information taken from prior admission - unsure if details are still correct. No family present to confirm      Prior Functioning/Environment Level of Independence: Needs assistance        Comments: Was relying on neighbor for assist with iADL's, and was independent with ADL's. Info again taken from prior admission.         OT Problem List: Decreased strength;Impaired balance (sitting and/or standing);Decreased knowledge of precautions;Pain;Impaired tone;Decreased activity tolerance;Decreased coordination      OT Treatment/Interventions: Self-care/ADL training;Therapeutic exercise;Patient/family education;Neuromuscular education;Balance training;Energy conservation;Therapeutic activities;Cognitive remediation/compensation;DME and/or AE instruction;Modalities;Visual/perceptual remediation/compensation    OT Goals(Current goals can be found in the care plan section) Acute Rehab OT Goals Patient Stated Goal: no goals stated OT Goal Formulation: With patient Time For Goal Achievement: 12/29/18 Potential to Achieve Goals: Good ADL Goals Pt Will Perform Grooming: with min assist Pt Will Perform Upper Body Bathing: with min assist Pt Will Perform Lower Body Bathing: with mod assist Pt Will Perform Upper Body Dressing: with min assist Pt Will Perform Lower Body Dressing: with mod assist Pt Will Transfer to Toilet: with  max assist Pt Will Perform Toileting - Clothing Manipulation and hygiene: with max assist  OT Frequency: Min 2X/week   Barriers to D/C: Decreased caregiver support             AM-PAC OT "6 Clicks" Daily Activity     Outcome Measure Help from another person eating meals?: Total Help from another person taking care of personal grooming?: Total Help from another person toileting, which includes using toliet, bedpan, or urinal?: Total Help from another person bathing (including washing, rinsing, drying)?: Total Help from another person to put on and taking off regular upper body clothing?: Total Help from another person to put on and taking off regular lower body clothing?: Total 6 Click Score: 6   End of Session    Activity Tolerance: Patient tolerated treatment well Patient left: in bed;with call bell/phone within reach;with bed alarm set  OT Visit Diagnosis: Hemiplegia and hemiparesis Hemiplegia - Right/Left: Left Hemiplegia - dominant/non-dominant: Non-Dominant Hemiplegia - caused by: Nontraumatic intracerebral hemorrhage                Time: 0751-0803 OT Time Calculation (min): 12 min Charges:  OT General Charges $OT Visit: 1 Visit OT Evaluation $OT Eval High Complexity: 1 High   Kaitlynne Wenz P, MS, OTR/L 12/15/2018, 9:16 AM

## 2018-12-15 NOTE — Clinical Social Work Note (Signed)
Clinical Social Work Assessment  Patient Details  Name: Edward Strickland MRN: [id number] Date of Birth: 04/15/1941  Date of referral:  12/15/18               Reason for consult:  Facility Placement                Permission sought to share information with:  Facility Sport and exercise psychologist, Family Supports Permission granted to share information::  Yes, Verbal Permission Granted  Name::     [place]::  SNFs  Relationship::  Friend and caregiver  Contact Information:  [phone]  Housing/Transportation Living arrangements for the past 2 months:  Apartment Source of Information:  Friend/Neighbor Patient Interpreter Needed:  None Criminal Activity/Legal Involvement Pertinent to Current Situation/Hospitalization:  No - Comment as needed Significant Relationships:  Friend Lives with:  Self Do you feel safe going back to the place where you live?  No Need for family participation in patient care:  Yes (Comment)  Care giving concerns:  CSW received consult for possible SNF placement at time of discharge. CSW spoke with patient's friend, [name], regarding PT recommendation of SNF placement at time of discharge as requested by patient. [name] reported that she helps patient when she can (he used to help take care of her mother) but given patient's current physical needs and fall risk, she feels he would do better at [facility]. Patient and [name] expressed understanding of PT recommendation and are agreeable to SNF placement at time of discharge. CSW to continue to follow and assist with discharge planning needs.   Social Worker assessment / plan:  CSW spoke with patient/[place] concerning possibility of rehab at [facility] before returning home.  Employment status:  Retired Forensic scientist:  Medicare PT Recommendations:  Inpatient [facility] / Referral to community resources:  [place]  Patient/Family's Response to care:  Patient/[place] recognize need  for rehab before returning home and are agreeable to a SNF. [name] requests [facility] or [facility] (has been there before) or a SNF in [place]. She states she will be able to sign admission paperwork since patient's brothers are not involved with the patient.   Patient/Family's Understanding of and Emotional Response to Diagnosis, Current Treatment, and Prognosis:  Patient/family is realistic regarding therapy needs and expressed being hopeful for SNF placement. [name] expressed understanding of CSW role and discharge process as well as medical condition. No questions/concerns about plan or treatment.    Emotional Assessment Appearance:  Appears stated age Attitude/Demeanor/Rapport:  Gracious Affect (typically observed):  Appropriate Orientation:  Oriented to Self, Oriented to Place, Oriented to Situation Alcohol / Substance use:  Not Applicable Psych involvement (Current and /or in the community):  No (Comment)  Discharge Needs  Concerns to be addressed:  Care Coordination Readmission within the last 30 days:  No Current discharge risk:  Dependent with Mobility Barriers to Discharge:  Continued Medical Work up   [employer], LCSW 12/15/2018, 2:38 PM

## 2018-12-16 DIAGNOSIS — E785 Hyperlipidemia, unspecified: Secondary | ICD-10-CM | POA: Diagnosis present

## 2018-12-16 DIAGNOSIS — I69091 Dysphagia following nontraumatic subarachnoid hemorrhage: Secondary | ICD-10-CM | POA: Diagnosis not present

## 2018-12-16 DIAGNOSIS — I161 Hypertensive emergency: Secondary | ICD-10-CM | POA: Diagnosis present

## 2018-12-16 DIAGNOSIS — I629 Nontraumatic intracranial hemorrhage, unspecified: Secondary | ICD-10-CM | POA: Diagnosis not present

## 2018-12-16 DIAGNOSIS — E46 Unspecified protein-calorie malnutrition: Secondary | ICD-10-CM | POA: Diagnosis not present

## 2018-12-16 DIAGNOSIS — I69354 Hemiplegia and hemiparesis following cerebral infarction affecting left non-dominant side: Secondary | ICD-10-CM | POA: Diagnosis not present

## 2018-12-16 DIAGNOSIS — J449 Chronic obstructive pulmonary disease, unspecified: Secondary | ICD-10-CM | POA: Diagnosis not present

## 2018-12-16 DIAGNOSIS — I259 Chronic ischemic heart disease, unspecified: Secondary | ICD-10-CM | POA: Diagnosis not present

## 2018-12-16 DIAGNOSIS — I350 Nonrheumatic aortic (valve) stenosis: Secondary | ICD-10-CM | POA: Diagnosis not present

## 2018-12-16 DIAGNOSIS — I249 Acute ischemic heart disease, unspecified: Secondary | ICD-10-CM | POA: Diagnosis not present

## 2018-12-16 DIAGNOSIS — I639 Cerebral infarction, unspecified: Secondary | ICD-10-CM | POA: Diagnosis not present

## 2018-12-16 DIAGNOSIS — D649 Anemia, unspecified: Secondary | ICD-10-CM | POA: Diagnosis not present

## 2018-12-16 DIAGNOSIS — M2550 Pain in unspecified joint: Secondary | ICD-10-CM | POA: Diagnosis not present

## 2018-12-16 DIAGNOSIS — Z7401 Bed confinement status: Secondary | ICD-10-CM | POA: Diagnosis not present

## 2018-12-16 DIAGNOSIS — R262 Difficulty in walking, not elsewhere classified: Secondary | ICD-10-CM | POA: Diagnosis not present

## 2018-12-16 DIAGNOSIS — N179 Acute kidney failure, unspecified: Secondary | ICD-10-CM | POA: Diagnosis not present

## 2018-12-16 DIAGNOSIS — I610 Nontraumatic intracerebral hemorrhage in hemisphere, subcortical: Secondary | ICD-10-CM | POA: Diagnosis not present

## 2018-12-16 DIAGNOSIS — I5022 Chronic systolic (congestive) heart failure: Secondary | ICD-10-CM | POA: Diagnosis not present

## 2018-12-16 DIAGNOSIS — Z951 Presence of aortocoronary bypass graft: Secondary | ICD-10-CM | POA: Diagnosis not present

## 2018-12-16 DIAGNOSIS — R531 Weakness: Secondary | ICD-10-CM | POA: Diagnosis not present

## 2018-12-16 DIAGNOSIS — R410 Disorientation, unspecified: Secondary | ICD-10-CM | POA: Diagnosis not present

## 2018-12-16 DIAGNOSIS — I10 Essential (primary) hypertension: Secondary | ICD-10-CM | POA: Diagnosis not present

## 2018-12-16 LAB — CBC
HCT: 33.9 % — ABNORMAL LOW (ref 39.0–52.0)
Hemoglobin: 11.5 g/dL — ABNORMAL LOW (ref 13.0–17.0)
MCH: 30.1 pg (ref 26.0–34.0)
MCHC: 33.9 g/dL (ref 30.0–36.0)
MCV: 88.7 fL (ref 80.0–100.0)
Platelets: 184 10*3/uL (ref 150–400)
RBC: 3.82 MIL/uL — ABNORMAL LOW (ref 4.22–5.81)
RDW: 13.2 % (ref 11.5–15.5)
WBC: 9.4 10*3/uL (ref 4.0–10.5)
nRBC: 0 % (ref 0.0–0.2)

## 2018-12-16 LAB — BASIC METABOLIC PANEL
Anion gap: 8 (ref 5–15)
BUN: 19 mg/dL (ref 8–23)
CO2: 23 mmol/L (ref 22–32)
Calcium: 8.8 mg/dL — ABNORMAL LOW (ref 8.9–10.3)
Chloride: 107 mmol/L (ref 98–111)
Creatinine, Ser: 1.13 mg/dL (ref 0.61–1.24)
GFR calc Af Amer: >60 mL/min (ref >60)
GFR calc non Af Amer: >60 mL/min (ref >60)
Glucose, Bld: 117 mg/dL — ABNORMAL HIGH (ref 70–99)
Potassium: 4 mmol/L (ref 3.5–5.1)
SODIUM: 138 mmol/L (ref 135–145)

## 2018-12-16 LAB — HEMOGLOBIN A1C
Hgb A1c MFr Bld: 5.9 % — ABNORMAL HIGH (ref 4.8–5.6)
Mean Plasma Glucose: 123 mg/dL

## 2018-12-16 MED ORDER — ATORVASTATIN CALCIUM 40 MG PO TABS: 40.0000 mg | ORAL_TABLET | Freq: Every day | ORAL | Status: AC

## 2018-12-16 MED ORDER — RESOURCE THICKENUP CLEAR PO POWD: 1.0000 | ORAL | Status: DC | PRN

## 2018-12-16 NOTE — Progress Notes (Signed)
Patient is set to discharge to [facility] today. Patient & friend, [name], aware. Discharge packet given to RN. PT[facility] called for transport.   Please call report to: [place], LCSW Clinical Social Worker [phone]

## 2018-12-16 NOTE — Plan of Care (Signed)
  Problem: Education: Goal: Knowledge of General Education information will improve Description Including pain rating scale, medication(s)/side effects and non-pharmacologic comfort measures Outcome: Progressing Note:  POC reviewed with pt.   

## 2018-12-16 NOTE — Progress Notes (Signed)
Gave report to RN in [facility]. Awaiting [facility] to pickup patient.

## 2018-12-16 NOTE — Progress Notes (Signed)
Physical Therapy Treatment Patient Details Name: Edward Strickland MRN: [id number] DOB: [birth year] Today's Date: 12/16/2018    History of Present Illness Pt is a 78 y/o male with a PMH significant for aortic stenosis, CAD s/p DES, TAVR 4034, chronic systolic heart failure, COPD, HTN. He presents to the ED with L-sided weakness and R-sided gaze. After sudden onset of symptoms and fall at home, friend waited 3 hours to alert EMS. Upon arrival to ED, stat CT revealed large R basal ganglia hemorrhage.     PT Comments    Pt participative, but distractible and inattentive to the L side.  Pt needed frequent redirection.  Emphasis on transitions to EOB, sitting balance with stress on upright orientation, w/shifting to right of midline, inhibition of push on the Right, sit to stand and transfers to recliner.    Follow Up Recommendations  Supervision/Assistance - 24 hour;SNF     Equipment Recommendations  Other (comment)    Recommendations for Other Services       Precautions / Restrictions Precautions Precautions: Fall Precaution Comments: increased tone in LLE Restrictions Weight Bearing Restrictions: No    Mobility  Bed Mobility Overal bed mobility: Needs Assistance Bed Mobility: Supine to Sit     Supine to sit: Mod assist;+2 for safety/equipment Sit to supine: +2 for safety/equipment;Max assist   General bed mobility comments: getting up on R side of bed with max multimodal cuing   Transfers Overall transfer level: Needs assistance   Transfers: Sit to/from Stand;Stand Pivot Transfers Sit to Stand: Max assist;Total assist;+2 physical assistance Stand pivot transfers: Max assist;+2 physical assistance       General transfer comment: Pt able to shift weight with assist further onto R side this session but needing significant assist from +2 for upright posture.   Ambulation/Gait             General Gait Details: Not able   Stairs             Wheelchair  Mobility    Modified Rankin (Stroke Patients Only) Modified Rankin (Stroke Patients Only) Pre-Morbid Rankin Score: Moderate disability Modified Rankin: Severe disability     Balance Overall balance assessment: Needs assistance Sitting-balance support: Feet supported;Single extremity supported Sitting balance-Leahy Scale: Poor Sitting balance - Comments: min - max A   Standing balance support: Bilateral upper extremity supported Standing balance-Leahy Scale: Zero Standing balance comment: +2 assist for safety                            Cognition Arousal/Alertness: Awake/alert Behavior During Therapy: WFL for tasks assessed/performed Overall Cognitive Status: Impaired/Different from baseline Area of Impairment: Attention;Memory;Following commands;Safety/judgement;Awareness;Problem solving                 Orientation Level: Place;Time;Situation Current Attention Level: Sustained Memory: Decreased short-term memory Following Commands: Follows one step commands with increased time;Follows one step commands consistently Safety/Judgement: Decreased awareness of safety;Decreased awareness of deficits Awareness: Intellectual Problem Solving: Slow processing;Decreased initiation;Difficulty sequencing;Requires verbal cues        Exercises      General Comments        Pertinent Vitals/Pain Pain Assessment: Faces Pain Score: 0-No pain Faces Pain Scale: Hurts whole lot Pain Location: L hip pain with mobility Pain Descriptors / Indicators: Aching;Discomfort;Guarding Pain Intervention(s): Limited activity within patient's tolerance;Monitored during session    Home Living  Prior Function            PT Goals (current goals can now be found in the care plan section) Acute Rehab PT Goals Patient Stated Goal: no goals stated PT Goal Formulation: Patient unable to participate in goal setting Time For Goal Achievement:  12/27/18 Potential to Achieve Goals: Fair Progress towards PT goals: Progressing toward goals    Frequency    Min 3X/week      PT Plan Discharge plan needs to be updated    Co-evaluation PT/OT/SLP Co-Evaluation/Treatment: Yes Reason for Co-Treatment: Complexity of the patient's impairments (multi-system involvement) PT goals addressed during session: Mobility/safety with mobility OT goals addressed during session: ADL's and self-care      AM-PAC PT "6 Clicks" Mobility   Outcome Measure  Help needed turning from your back to your side while in a flat bed without using bedrails?: Total Help needed moving from lying on your back to sitting on the side of a flat bed without using bedrails?: Total Help needed moving to and from a bed to a chair (including a wheelchair)?: Total Help needed standing up from a chair using your arms (e.g., wheelchair or bedside chair)?: Total Help needed to walk in hospital room?: Total Help needed climbing 3-5 steps with a railing? : Total 6 Click Score: 6    End of Session   Activity Tolerance: Patient tolerated treatment well Patient left: in chair;with call bell/phone within reach;with chair alarm set;Other (comment)(on lift pad) Nurse Communication: Mobility status PT Visit Diagnosis: Hemiplegia and hemiparesis;Other symptoms and signs involving the nervous system (R29.898) Hemiplegia - Right/Left: Left Hemiplegia - caused by: Nontraumatic intracerebral hemorrhage     Time: 8502-7741 PT Time Calculation (min) (ACUTE ONLY): 38 min  Charges:  $Therapeutic Activity: 8-22 mins $Neuromuscular Re-education: 8-22 mins                     12/16/2018  Donnella Sham, PT [place] [phone]  (pager) [phone]  (office)   Tessie Fass Avital Dancy 12/16/2018, 12:32 PM

## 2018-12-16 NOTE — Discharge Summary (Addendum)
Stroke Discharge Summary  Patient ID: Edward Strickland   MRN: [id number]      DOB: 08/02/1941  Date of Admission: 12/12/2018 Date of Discharge: 12/16/2018  Attending Physician:  Garvin Fila, MD, Stroke MD Consultant(s):     Alger Simons, MD (Physical Medicine & Rehabtilitation)  Patient's PCP:  Jaclynn Major, NP  DISCHARGE DIAGNOSIS:  Principal Problem:   ICH (intracerebral hemorrhage) ([place]) - R basal ganglia d/t HTN with cytotoxic edema and mild mass effect Active Problems:   Chronic systolic congestive heart failure [facility])   Essential hypertension   Hypertensive emergency   Hyperlipidemia   Past Medical History:  Diagnosis Date  . Aortic stenosis, severe   . CAD (coronary artery disease)    a. 06/2017: s/p DES to LAD.   Marland Kitchen Chronic systolic CHF (congestive heart failure) ([place])   . COPD (chronic obstructive pulmonary disease) ([place])   . Hypertension   . S/P TAVR (transcatheter aortic valve replacement) 07/23/2017   29 mm Edwards Sapien 3 transcatheter heart valve placed via percutaneous right transfemoral approach   Past Surgical History:  Procedure Laterality Date  . CHEST TUBE INSERTION Right 07/23/2017   Procedure: INSERTION PLEURAL DRAINAGE CATHETER;  Surgeon: Rexene Alberts, MD;  Location: [place];  Service: Thoracic;  Laterality: Right;  . CORONARY STENT INTERVENTION N/A 07/17/2017   Procedure: CORONARY STENT INTERVENTION;  Surgeon: Burnell Blanks, MD;  Location: [place] CV LAB;  Service: Cardiovascular;  Laterality: N/A;  . IR THORACENTESIS ASP PLEURAL SPACE W/IMG GUIDE  07/11/2017  . MULTIPLE EXTRACTIONS WITH ALVEOLOPLASTY N/A 07/15/2017   Procedure: Extraction of tooth #'s 4,6,7,8,9,15, 20,21,22, 23 ,24,26,and 27 with alveoloplassty;  Surgeon: Lenn Cal, DDS;  Location: [facility];  Service: Oral Surgery;  Laterality: N/A;  . REMOVAL OF PLEURAL DRAINAGE CATHETER N/A 08/07/2017   Procedure: REMOVAL OF PLEURAL DRAINAGE CATHETER;  Surgeon: Rexene Alberts, MD;  Location: [facility];  Service: Thoracic;  Laterality: N/A;  . TEE WITHOUT CARDIOVERSION N/A 07/23/2017   Procedure: TRANSESOPHAGEAL ECHOCARDIOGRAM (TEE);  Surgeon: Burnell Blanks, MD;  Location: [facility];  Service: Open Heart Surgery;  Laterality: N/A;  . TRANSCATHETER AORTIC VALVE REPLACEMENT, TRANSFEMORAL N/A 07/23/2017   Procedure: TRANSCATHETER AORTIC VALVE REPLACEMENT, TRANSFEMORAL;  Surgeon: Burnell Blanks, MD;  Location: [facility];  Service: Open Heart Surgery;  Laterality: N/A;    Allergies as of 12/16/2018   No Known Allergies     Medication List    STOP taking these medications   aspirin EC 81 MG tablet     TAKE these medications   atorvastatin 40 MG tablet Commonly known as:  LIPITOR Take 1 tablet (40 mg total) by mouth daily at 6 PM. What changed:    medication strength  how much to take   carvedilol 3.125 MG tablet Commonly known as:  COREG Take 3.125 mg by mouth 2 (two) times daily with a meal. What changed:  Another medication with the same name was removed. Continue taking this medication, and follow the directions you see here.   ENTRESTO 24-26 MG Generic drug:  sacubitril-valsartan Take 1 tablet by mouth 2 (two) times daily.   furosemide 40 MG tablet Commonly known as:  LASIX Take 1 tablet (40 mg total) by mouth daily.   multivitamin with minerals Tabs tablet Take 1 tablet by mouth daily.   RESOURCE THICKENUP CLEAR Powd Take 120 g by mouth as needed (For honey thick liquid diet).   triamcinolone cream 0.5 % Commonly  known as:  KENALOG Apply 1 application topically 2 (two) times daily as needed (rash on legs).       LABORATORY STUDIES CBC    Component Value Date/Time   WBC 9.4 12/16/2018 0523   RBC 3.82 (L) 12/16/2018 0523   HGB 11.5 (L) 12/16/2018 0523   HCT 33.9 (L) 12/16/2018 0523   PLT 184 12/16/2018 0523   MCV 88.7 12/16/2018 0523   MCH 30.1 12/16/2018 0523   MCHC 33.9 12/16/2018 0523   RDW 13.2 12/16/2018  0523   LYMPHSABS 1.2 12/12/2018 1822   MONOABS 0.5 12/12/2018 1822   EOSABS 0.1 12/12/2018 1822   BASOSABS 0.1 12/12/2018 1822   CMP    Component Value Date/Time   NA 138 12/16/2018 0523   NA 141 11/13/2017 1420   K 4.0 12/16/2018 0523   CL 107 12/16/2018 0523   CO2 23 12/16/2018 0523   GLUCOSE 117 (H) 12/16/2018 0523   BUN 19 12/16/2018 0523   BUN 20 11/13/2017 1420   CREATININE 1.13 12/16/2018 0523   CALCIUM 8.8 (L) 12/16/2018 0523   PROT 7.7 12/12/2018 1822   ALBUMIN 4.1 12/12/2018 1822   AST 32 12/12/2018 1822   ALT 21 12/12/2018 1822   ALKPHOS 69 12/12/2018 1822   BILITOT 0.9 12/12/2018 1822   GFRNONAA >60 12/16/2018 0523   GFRAA >60 12/16/2018 0523   COAGS Lab Results  Component Value Date   INR 1.08 12/12/2018   INR 1.36 07/23/2017   INR 1.13 07/22/2017   Lipid Panel    Component Value Date/Time   CHOL 126 12/14/2018 0021   TRIG 190 (H) 12/14/2018 0021   HDL 32 (L) 12/14/2018 0021   CHOLHDL 3.9 12/14/2018 0021   VLDL 38 12/14/2018 0021   LDLCALC 56 12/14/2018 0021   HgbA1C  Lab Results  Component Value Date   HGBA1C 5.9 (H) 12/14/2018   Urinalysis    Component Value Date/Time   COLORURINE YELLOW 12/13/2018 0925   APPEARANCEUR CLEAR 12/13/2018 0925   LABSPEC 1.032 (H) 12/13/2018 0925   PHURINE 5.0 12/13/2018 0925   GLUCOSEU NEGATIVE 12/13/2018 0925   HGBUR NEGATIVE 12/13/2018 0925   BILIRUBINUR NEGATIVE 12/13/2018 0925   KETONESUR NEGATIVE 12/13/2018 0925   PROTEINUR NEGATIVE 12/13/2018 0925   NITRITE NEGATIVE 12/13/2018 0925   LEUKOCYTESUR NEGATIVE 12/13/2018 0925   Urine Drug Screen     Component Value Date/Time   LABOPIA NONE DETECTED 12/13/2018 0925   COCAINSCRNUR NONE DETECTED 12/13/2018 0925   LABBENZ NONE DETECTED 12/13/2018 0925   AMPHETMU NONE DETECTED 12/13/2018 0925   THCU NONE DETECTED 12/13/2018 0925   LABBARB NONE DETECTED 12/13/2018 0925    Alcohol Level    Component Value Date/Time   ETH <10 12/12/2018 1822     SIGNIFICANT DIAGNOSTIC STUDIES Ct Head Code Stroke Wo Contrast 12/12/2018 IMPRESSION:  1. Acute hemorrhage in the right basal ganglia measuring up to 4.9 cm, 38 cc. Associate edema and local mass effect partially effaces right lateral ventricle and results in 1 mm right-to-left midline shift.  2. ASPECTS is not applicable.  3. Chronic microvascular ischemic changes and volume loss of the brain.   Ct Angio Head W Or Wo Contrast 12/12/2018 IMPRESSION:  1. No aneurysm, arteriovenous malformation or other causative hemorrhagic lesion. The distribution of the hematoma is most consistent with hypertensive hemorrhage.  2. No intracranial arterial occlusion or hemodynamically significant stenosis.   MRI brain 12/13/2018  No evidence of additional bleeding related to intraparenchymal hematoma with its epicenter in the right  basal ganglia region, measuring approximately 5 x 3.2 x 4.3 cm, with surrounding edema. Mild flattening of the right lateral ventricle and right-to-left shift of 3 mm. No sign of underlying neoplastic or vascular lesion.  Transthoracic Echocardiogram 1. The left ventricle has normal systolic function, with an ejection fraction of 55-60%. The cavity size was normal. There is mildly increased left ventricular wall thickness. Left ventricular diastolic Doppler parameters are consistent with impaired relaxation Indeterminent filling pressures The E/e' is between 8-15.  2. The right ventricle has normal systolic function. The cavity was mildly enlarged. There is no increase in right ventricular wall thickness.  3. Right atrial size was mildly dilated.  4. The mitral valve is myxomatous. Mild thickening of the mitral valve leaflet. Mild calcification of the mitral valve leaflet.  5. The aortic valve was not well visualized Moderate calcification of the aortic valve. Aortic valve regurgitation is mild to moderate by color flow Doppler. mild-moderate stenosis of the aortic valve.  6.  The interatrial septum was not well visualized.  Bilateral Carotid Dopplers bilateral 1-39 percent carotid stenosis     HISTORY OF PRESENT ILLNESS Edward Strickland is an 78 y.o. male with past medical history of severe aortic stenosis, coronary artery disease status post DES on aspirin and Plavix, chronic systolic heart failure, COPD, hypertension presents to the emergency room with left hemiplegia and right-sided gaze as a stroke alert. Patient  was with his friend when symptoms started at 3 PM on 12/12/2018 ([facility]), suddenly developed slurred speech and had a fall.  Patient's friend tried to get him up however after 3 hours decided to call EMS.  Blood pressure was 696 systolic by EMS.  On arrival stat CT head showed large right basal ganglia hemorrhage.  Patient was started on Cleveprix drip for rapid blood pressure control. Intracerebral Hemorrhage (ICH) Score 1.  He was admitted to the neuro ICU for further evaluation and treatment.   HOSPITAL COURSE [name] is a 78 y.o. male with history of severe aortic stenosis, coronary artery disease status post DES on aspirin and Plavix, chronic systolic heart failure, COPD, hypertension  presenting with sudden onset slurred speech and subsequent fall.    Large right basal ganglia hypertensive hemorrhage  CT head - Acute hemorrhage in the right basal ganglia measuring up to 4.9 cm, 38 cc. Associate edema and local mass effect partially effaces right lateral ventricle and results in 1 mm right-to-left midline shift.   MRI head -5 x 3.0 x 4.3 cm right basal ganglia hemorrhage with mild surrounding edema and mass effect on right lateral ventricle with 3 mm shift but no hydrocephalus.  MRA head - See CT angiogram head  CTA Head -  No aneurysm, arteriovenous malformation or other causative hemorrhagic lesion. The distribution of the hematoma is most consistent with hypertensive hemorrhage.  Carotid Doppler - 1-39 percent bilateral stenosis  2D Echo  EF 55-60%. No source of embolus   LDL - 56  HgbA1c - 5.9  UDS - negative  aspirin 81 mg daily prior to admission, now on No antithrombotic given hmg  Therapy recommendations:  SNF  Disposition:  SNF  Hypertensive Emergency  Elevated to 160s on admission   treated with Cleviprex in ICU  On Coreg and Enestro prior to admission  Resume home meds  BP goal normotensive  Hyperlipidemia  Lipid lowering medication PTA: Lipitor 80 mg daily  LDL 56  Current lipid lowering medication: none  Resume Lipitor at 40 mg daily at time of  discharge  Other Stroke Risk Factors  Advanced age  Former cigarette smoker - quit  Coronary artery disease  Chronic systolic congestive heart failure on Enestro  Other Active Problems  Leukocytosis, resolved - 12.5 (afebrile) ->9.4   DISCHARGE EXAM Blood pressure (!) 122/54, pulse 75, temperature 98.3 F (36.8 C), temperature source Oral, resp. rate 20, height 5\' 7"  (1.702 m), weight 55.4 kg, SpO2 99 %. Frail unkempt malnourished looking elderly male not in distress. Afebrile. Head is nontraumatic. Neck is supple without bruit.  Cardiac exam no murmur or gallop. Lungs are clear to auscultation. Distal pulses are well felt. Neurological Exam :  He is awake and alert oriented 2. Diminished attention, registration and recall. Poor insight into his condition.mild dysarthria. No aphasia. Follows simple commands well. Right gaze preference. Will not look to the left. Blinks to threat on the right but not on the left. Left lower face weakness. Tongue midline. Left hemiplegia 1-2/5 left upper extremity and 2/5 left lower extremity. Increased tone in the left lower extremity.left plantar upgoing right downgoing. Gait not tested  Discharge Diet    dysphagia 1 honey thick liquids  DISCHARGE PLAN  Disposition:  Discharge to skilled nursing facility for ongoing PT, OT and ST.   Due to hemorrhage and risk of bleeding, do not take aspirin,  aspirin-containing medications, or ibuprofen products (for example, Ibuprofen, Anacin, Alka-Seltzer, Bufferin, Ecotrin, Dristan, Sine-Off, Midol, Nuprin, Aleve, Thera-Flu, Advil).   Ongoing risk factor control by Primary Care Physician at time of discharge  Follow-up Jaclynn Major, NP in 2 weeks or MD at [facility].  Follow-up in [facility] in 4 weeks, office to schedule an appointment.   40 minutes were spent preparing discharge.  Burnetta Sabin, MSN, APRN, ANVP-BC, AGPCNP-BC Advanced Practice Stroke Nurse [place] for Schedule & Pager information 12/16/2018 1:10 PM   I have personally obtained history,examined this patient, reviewed notes, independently viewed imaging studies, participated in medical decision making and plan of care.ROS completed by me personally and pertinent positives fully documented  I have made any additions or clarifications directly to the above note. Agree with note above.    Antony Contras, MD Medical Director [facility] Pager: [phone] 12/16/2018 1:55 PM

## 2018-12-16 NOTE — Progress Notes (Signed)
  Speech Language Pathology Treatment: Dysphagia;Cognitive-Linquistic  Patient Details Name: Edward Strickland MRN: [id number] DOB: December 16, 1940 Today's Date: 12/16/2018 Time: 0910-0930 SLP Time Calculation (min) (ACUTE ONLY): 20 min  Assessment / Plan / Recommendation Clinical Impression  Skilled treatment session focused on dysphagia and cognition goals. SLP facilitated session by providing skilled observation of pt consuming nectar thick liquids via cup. Pt required Mod A cues to cease talking while consuming liquids. Pt with general confusion and unable to follow compensatory strategies or effectively comprehend situation. Education provided on need for thickened liquids with no ability to return knowledge. Pt required Max A multimodal cues to look to midline with no ability to look to left. Pt was left in bed, bed alarm on and nurse present. Current diet continues to be safest diet.    HPI HPI: Edward Strickland a 78 y.o.malewith past medical history of severe aortic stenosis, coronary artery disease status post DES on aspirin and Plavix, chronic systolic heart failure, COPD, hypertension presents to the emergency room with left hemiplegia and right-sided gaze as a stroke alert. On arrival stat CT head showed large right basal ganglia hemorrhage. No chest imaging available at present. MRI with right basal ganglia intraparenchymal hematoma 5x3.2x4.3cm with surrounding edema, mild flattening of R lateral ventricle and right-to-left shift of 29mm      SLP Plan  Continue with current plan of care       Recommendations  Diet recommendations: Dysphagia 1 (puree);Nectar-thick liquid Liquids provided via: Cup Medication Administration: Crushed with puree Supervision: Full supervision/cueing for compensatory strategies Compensations: Minimize environmental distractions;Slow rate;Small sips/bites;Lingual sweep for clearance of pocketing Postural Changes and/or Swallow Maneuvers: Seated upright 90 degrees                 Oral Care Recommendations: Oral care BID Follow up Recommendations: Skilled Nursing facility SLP Visit Diagnosis: Dysphagia, oropharyngeal phase (R13.12);Cognitive communication deficit (R41.841) Plan: Continue with current plan of care       [place] 12/16/2018, 11:01 AM

## 2018-12-16 NOTE — Progress Notes (Signed)
Pt. transported by [facility] to [facility]; alert and oriented to self and occ. to place. No problems noted.

## 2018-12-16 NOTE — Progress Notes (Signed)
Occupational Therapy Treatment Patient Details Name: Edward Strickland MRN: [id number] DOB: 12/27/1940 Today's Date: 12/16/2018    History of present illness Pt is a 78 y/o male with a PMH significant for aortic stenosis, CAD s/p DES, TAVR 5732, chronic systolic heart failure, COPD, HTN. He presents to the ED with L-sided weakness and R-sided gaze. After sudden onset of symptoms and fall at home, friend waited 3 hours to alert EMS. Upon arrival to ED, stat CT revealed large R basal ganglia hemorrhage.    OT comments  Skilled co-tx with PT for safety with focus on weight shifting, sitting balance, sit <>stand, following command, attending to task, and functional transfer. Pt having visual hallucination at start of session and reports, " Look at that black cat." Pt needing cuing for orientation. Bed mobility with +2 assist to EOB. Max multimodal cuing and therapist holding pt's R hand in order to prevent pushing with tasks as pt leaning onto R hip for stretch, weight shift, and for midline orientation. Mod - max cuing for pt to attend to tasks throughout session as well. Pt standing with total +2 and manual facilitation for upright posture and weight shift towards R. Stand pivot transfer +2 into recliner chair. Pt positioned for comfort and safety. Visitors entering the room as therapist exiting.   Follow Up Recommendations  SNF    Equipment Recommendations  Other (comment)(defer to next venue of care)    Recommendations for Other Services      Precautions / Restrictions Precautions Precautions: Fall Precaution Comments: increased tone in LLE Restrictions Weight Bearing Restrictions: No       Mobility Bed Mobility Overal bed mobility: Needs Assistance Bed Mobility: Supine to Sit     Supine to sit: Mod assist;+2 for safety/equipment Sit to supine: +2 for safety/equipment;Max assist   General bed mobility comments: getting up on R side of bed with max multimodal cuing    Transfers Overall transfer level: Needs assistance   Transfers: Sit to/from Stand;Stand Pivot Transfers Sit to Stand: Max assist;Total assist;+2 physical assistance Stand pivot transfers: Max assist;+2 physical assistance       General transfer comment: Pt able to shift weight with assist further onto R side this session but needing significant assist from +2 for upright posture.     Balance Overall balance assessment: Needs assistance Sitting-balance support: Feet supported;Single extremity supported Sitting balance-Leahy Scale: Poor Sitting balance - Comments: min - max A   Standing balance support: Bilateral upper extremity supported Standing balance-Leahy Scale: Zero Standing balance comment: +2 assist for safety         ADL either performed or assessed with clinical judgement        Vision Patient Visual Report: No change from baseline Additional Comments: L neglect          Cognition Arousal/Alertness: Awake/alert Behavior During Therapy: WFL for tasks assessed/performed Overall Cognitive Status: Impaired/Different from baseline Area of Impairment: Attention;Memory;Following commands;Safety/judgement;Awareness;Problem solving        Orientation Level: Place;Time;Situation Current Attention Level: Sustained Memory: Decreased short-term memory Following Commands: Follows one step commands with increased time;Follows one step commands consistently Safety/Judgement: Decreased awareness of safety;Decreased awareness of deficits Awareness: Intellectual Problem Solving: Slow processing;Decreased initiation;Difficulty sequencing;Requires verbal cues                     Pertinent Vitals/ Pain       Pain Assessment: Faces Pain Score: 0-No pain Faces Pain Scale: Hurts whole lot Pain Location: L hip pain with mobility  Pain Descriptors / Indicators: Aching;Discomfort;Guarding Pain Intervention(s): Monitored during session;Limited activity within patient's  tolerance;Repositioned         Frequency  Min 2X/week        Progress Toward Goals  OT Goals(current goals can now be found in the care plan section)  Progress towards OT goals: Progressing toward goals  Acute Rehab OT Goals Patient Stated Goal: no goals stated OT Goal Formulation: With patient Time For Goal Achievement: 12/30/18 Potential to Achieve Goals: Good  Plan Discharge plan needs to be updated       AM-PAC OT "6 Clicks" Daily Activity     Outcome Measure   Help from another person eating meals?: Total Help from another person taking care of personal grooming?: Total Help from another person toileting, which includes using toliet, bedpan, or urinal?: Total Help from another person bathing (including washing, rinsing, drying)?: Total Help from another person to put on and taking off regular upper body clothing?: Total   6 Click Score: 5    End of Session    OT Visit Diagnosis: Hemiplegia and hemiparesis Hemiplegia - Right/Left: Left Hemiplegia - dominant/non-dominant: Non-Dominant Hemiplegia - caused by: Nontraumatic intracerebral hemorrhage   Activity Tolerance Patient tolerated treatment well   Patient Left in chair;with call bell/phone within reach;with chair alarm set;with family/visitor present   Nurse Communication Mobility status;Other (comment)(sling under pt for assist back to bed later)        Time: 1117-3567 OT Time Calculation (min): 38 min  Charges: OT General Charges $OT Visit: 1 Visit OT Treatments $Therapeutic Activity: 8-22 mins   Kassadee Carawan P, MS, OTR/L 12/16/2018, 11:44 AM

## 2018-12-19 DIAGNOSIS — E785 Hyperlipidemia, unspecified: Secondary | ICD-10-CM | POA: Diagnosis not present

## 2018-12-19 DIAGNOSIS — R262 Difficulty in walking, not elsewhere classified: Secondary | ICD-10-CM | POA: Diagnosis not present

## 2018-12-19 DIAGNOSIS — I69091 Dysphagia following nontraumatic subarachnoid hemorrhage: Secondary | ICD-10-CM | POA: Diagnosis not present

## 2018-12-19 DIAGNOSIS — I639 Cerebral infarction, unspecified: Secondary | ICD-10-CM | POA: Diagnosis not present

## 2019-01-15 ENCOUNTER — Other Ambulatory Visit: Payer: Self-pay | Admitting: *Deleted

## 2019-01-15 NOTE — Patient Outreach (Signed)
[facility]) Care Management  01/15/2019  Bartholomew Ramesh 27-Jul-1941 [id number]   Collaboration with discharge planner at [facility] in [place] reveals Mr. Cargile will be transitioning to long term care at facility.  Therefore there are no [facility] Care Management needs.    Marthenia Rolling, MSN-Ed, RN,BSN [place] Acute Care Coordinator  [phone]

## 2019-01-22 ENCOUNTER — Ambulatory Visit: Payer: Medicare Other | Admitting: Neurology

## 2019-01-23 DIAGNOSIS — R5381 Other malaise: Secondary | ICD-10-CM | POA: Diagnosis not present

## 2019-01-23 DIAGNOSIS — D649 Anemia, unspecified: Secondary | ICD-10-CM | POA: Diagnosis not present

## 2019-01-23 DIAGNOSIS — I69091 Dysphagia following nontraumatic subarachnoid hemorrhage: Secondary | ICD-10-CM | POA: Diagnosis not present

## 2019-01-23 DIAGNOSIS — I5032 Chronic diastolic (congestive) heart failure: Secondary | ICD-10-CM | POA: Diagnosis not present

## 2019-01-26 DIAGNOSIS — R1312 Dysphagia, oropharyngeal phase: Secondary | ICD-10-CM | POA: Diagnosis not present

## 2019-01-26 DIAGNOSIS — M6281 Muscle weakness (generalized): Secondary | ICD-10-CM | POA: Diagnosis not present

## 2019-01-26 DIAGNOSIS — R41841 Cognitive communication deficit: Secondary | ICD-10-CM | POA: Diagnosis not present

## 2019-01-26 DIAGNOSIS — I639 Cerebral infarction, unspecified: Secondary | ICD-10-CM | POA: Diagnosis not present

## 2019-01-26 DIAGNOSIS — I69354 Hemiplegia and hemiparesis following cerebral infarction affecting left non-dominant side: Secondary | ICD-10-CM | POA: Diagnosis not present

## 2019-01-27 DIAGNOSIS — F419 Anxiety disorder, unspecified: Secondary | ICD-10-CM | POA: Diagnosis not present

## 2019-01-27 DIAGNOSIS — I639 Cerebral infarction, unspecified: Secondary | ICD-10-CM | POA: Diagnosis not present

## 2019-01-27 DIAGNOSIS — R41841 Cognitive communication deficit: Secondary | ICD-10-CM | POA: Diagnosis not present

## 2019-01-27 DIAGNOSIS — I69354 Hemiplegia and hemiparesis following cerebral infarction affecting left non-dominant side: Secondary | ICD-10-CM | POA: Diagnosis not present

## 2019-01-27 DIAGNOSIS — R1312 Dysphagia, oropharyngeal phase: Secondary | ICD-10-CM | POA: Diagnosis not present

## 2019-01-27 DIAGNOSIS — M6281 Muscle weakness (generalized): Secondary | ICD-10-CM | POA: Diagnosis not present

## 2019-01-28 DIAGNOSIS — M6281 Muscle weakness (generalized): Secondary | ICD-10-CM | POA: Diagnosis not present

## 2019-01-28 DIAGNOSIS — I639 Cerebral infarction, unspecified: Secondary | ICD-10-CM | POA: Diagnosis not present

## 2019-01-28 DIAGNOSIS — R41841 Cognitive communication deficit: Secondary | ICD-10-CM | POA: Diagnosis not present

## 2019-01-28 DIAGNOSIS — I629 Nontraumatic intracranial hemorrhage, unspecified: Secondary | ICD-10-CM | POA: Diagnosis not present

## 2019-01-28 DIAGNOSIS — R1312 Dysphagia, oropharyngeal phase: Secondary | ICD-10-CM | POA: Diagnosis not present

## 2019-01-29 DIAGNOSIS — R41841 Cognitive communication deficit: Secondary | ICD-10-CM | POA: Diagnosis not present

## 2019-01-29 DIAGNOSIS — M6281 Muscle weakness (generalized): Secondary | ICD-10-CM | POA: Diagnosis not present

## 2019-01-29 DIAGNOSIS — I639 Cerebral infarction, unspecified: Secondary | ICD-10-CM | POA: Diagnosis not present

## 2019-01-29 DIAGNOSIS — R1312 Dysphagia, oropharyngeal phase: Secondary | ICD-10-CM | POA: Diagnosis not present

## 2019-01-29 DIAGNOSIS — I629 Nontraumatic intracranial hemorrhage, unspecified: Secondary | ICD-10-CM | POA: Diagnosis not present

## 2019-01-30 DIAGNOSIS — M6281 Muscle weakness (generalized): Secondary | ICD-10-CM | POA: Diagnosis not present

## 2019-01-30 DIAGNOSIS — R1312 Dysphagia, oropharyngeal phase: Secondary | ICD-10-CM | POA: Diagnosis not present

## 2019-01-30 DIAGNOSIS — R41841 Cognitive communication deficit: Secondary | ICD-10-CM | POA: Diagnosis not present

## 2019-01-30 DIAGNOSIS — I629 Nontraumatic intracranial hemorrhage, unspecified: Secondary | ICD-10-CM | POA: Diagnosis not present

## 2019-01-30 DIAGNOSIS — I639 Cerebral infarction, unspecified: Secondary | ICD-10-CM | POA: Diagnosis not present

## 2019-02-02 DIAGNOSIS — R1312 Dysphagia, oropharyngeal phase: Secondary | ICD-10-CM | POA: Diagnosis not present

## 2019-02-02 DIAGNOSIS — I639 Cerebral infarction, unspecified: Secondary | ICD-10-CM | POA: Diagnosis not present

## 2019-02-02 DIAGNOSIS — R41841 Cognitive communication deficit: Secondary | ICD-10-CM | POA: Diagnosis not present

## 2019-02-02 DIAGNOSIS — M6281 Muscle weakness (generalized): Secondary | ICD-10-CM | POA: Diagnosis not present

## 2019-02-02 DIAGNOSIS — I629 Nontraumatic intracranial hemorrhage, unspecified: Secondary | ICD-10-CM | POA: Diagnosis not present

## 2019-02-03 DIAGNOSIS — I639 Cerebral infarction, unspecified: Secondary | ICD-10-CM | POA: Diagnosis not present

## 2019-02-03 DIAGNOSIS — R1312 Dysphagia, oropharyngeal phase: Secondary | ICD-10-CM | POA: Diagnosis not present

## 2019-02-03 DIAGNOSIS — M6281 Muscle weakness (generalized): Secondary | ICD-10-CM | POA: Diagnosis not present

## 2019-02-03 DIAGNOSIS — R41841 Cognitive communication deficit: Secondary | ICD-10-CM | POA: Diagnosis not present

## 2019-02-03 DIAGNOSIS — I629 Nontraumatic intracranial hemorrhage, unspecified: Secondary | ICD-10-CM | POA: Diagnosis not present

## 2019-02-04 DIAGNOSIS — I639 Cerebral infarction, unspecified: Secondary | ICD-10-CM | POA: Diagnosis not present

## 2019-02-04 DIAGNOSIS — R1312 Dysphagia, oropharyngeal phase: Secondary | ICD-10-CM | POA: Diagnosis not present

## 2019-02-04 DIAGNOSIS — I629 Nontraumatic intracranial hemorrhage, unspecified: Secondary | ICD-10-CM | POA: Diagnosis not present

## 2019-02-04 DIAGNOSIS — R41841 Cognitive communication deficit: Secondary | ICD-10-CM | POA: Diagnosis not present

## 2019-02-04 DIAGNOSIS — M6281 Muscle weakness (generalized): Secondary | ICD-10-CM | POA: Diagnosis not present

## 2019-02-05 DIAGNOSIS — I629 Nontraumatic intracranial hemorrhage, unspecified: Secondary | ICD-10-CM | POA: Diagnosis not present

## 2019-02-05 DIAGNOSIS — R41841 Cognitive communication deficit: Secondary | ICD-10-CM | POA: Diagnosis not present

## 2019-02-05 DIAGNOSIS — M6281 Muscle weakness (generalized): Secondary | ICD-10-CM | POA: Diagnosis not present

## 2019-02-05 DIAGNOSIS — I639 Cerebral infarction, unspecified: Secondary | ICD-10-CM | POA: Diagnosis not present

## 2019-02-05 DIAGNOSIS — R1312 Dysphagia, oropharyngeal phase: Secondary | ICD-10-CM | POA: Diagnosis not present

## 2019-02-06 DIAGNOSIS — R1312 Dysphagia, oropharyngeal phase: Secondary | ICD-10-CM | POA: Diagnosis not present

## 2019-02-06 DIAGNOSIS — R41841 Cognitive communication deficit: Secondary | ICD-10-CM | POA: Diagnosis not present

## 2019-02-06 DIAGNOSIS — I629 Nontraumatic intracranial hemorrhage, unspecified: Secondary | ICD-10-CM | POA: Diagnosis not present

## 2019-02-06 DIAGNOSIS — M6281 Muscle weakness (generalized): Secondary | ICD-10-CM | POA: Diagnosis not present

## 2019-02-06 DIAGNOSIS — I639 Cerebral infarction, unspecified: Secondary | ICD-10-CM | POA: Diagnosis not present

## 2019-02-07 DIAGNOSIS — I639 Cerebral infarction, unspecified: Secondary | ICD-10-CM | POA: Diagnosis not present

## 2019-02-07 DIAGNOSIS — I629 Nontraumatic intracranial hemorrhage, unspecified: Secondary | ICD-10-CM | POA: Diagnosis not present

## 2019-02-07 DIAGNOSIS — R1312 Dysphagia, oropharyngeal phase: Secondary | ICD-10-CM | POA: Diagnosis not present

## 2019-02-07 DIAGNOSIS — M6281 Muscle weakness (generalized): Secondary | ICD-10-CM | POA: Diagnosis not present

## 2019-02-07 DIAGNOSIS — R41841 Cognitive communication deficit: Secondary | ICD-10-CM | POA: Diagnosis not present

## 2019-02-09 DIAGNOSIS — M6281 Muscle weakness (generalized): Secondary | ICD-10-CM | POA: Diagnosis not present

## 2019-02-09 DIAGNOSIS — I629 Nontraumatic intracranial hemorrhage, unspecified: Secondary | ICD-10-CM | POA: Diagnosis not present

## 2019-02-09 DIAGNOSIS — I639 Cerebral infarction, unspecified: Secondary | ICD-10-CM | POA: Diagnosis not present

## 2019-02-09 DIAGNOSIS — R41841 Cognitive communication deficit: Secondary | ICD-10-CM | POA: Diagnosis not present

## 2019-02-09 DIAGNOSIS — R1312 Dysphagia, oropharyngeal phase: Secondary | ICD-10-CM | POA: Diagnosis not present

## 2019-02-10 DIAGNOSIS — I639 Cerebral infarction, unspecified: Secondary | ICD-10-CM | POA: Diagnosis not present

## 2019-02-10 DIAGNOSIS — M6281 Muscle weakness (generalized): Secondary | ICD-10-CM | POA: Diagnosis not present

## 2019-02-10 DIAGNOSIS — R1312 Dysphagia, oropharyngeal phase: Secondary | ICD-10-CM | POA: Diagnosis not present

## 2019-02-10 DIAGNOSIS — R41841 Cognitive communication deficit: Secondary | ICD-10-CM | POA: Diagnosis not present

## 2019-02-10 DIAGNOSIS — I629 Nontraumatic intracranial hemorrhage, unspecified: Secondary | ICD-10-CM | POA: Diagnosis not present

## 2019-02-11 DIAGNOSIS — I639 Cerebral infarction, unspecified: Secondary | ICD-10-CM | POA: Diagnosis not present

## 2019-02-11 DIAGNOSIS — I629 Nontraumatic intracranial hemorrhage, unspecified: Secondary | ICD-10-CM | POA: Diagnosis not present

## 2019-02-11 DIAGNOSIS — R41841 Cognitive communication deficit: Secondary | ICD-10-CM | POA: Diagnosis not present

## 2019-02-11 DIAGNOSIS — R1312 Dysphagia, oropharyngeal phase: Secondary | ICD-10-CM | POA: Diagnosis not present

## 2019-02-11 DIAGNOSIS — M6281 Muscle weakness (generalized): Secondary | ICD-10-CM | POA: Diagnosis not present

## 2019-02-12 DIAGNOSIS — M6281 Muscle weakness (generalized): Secondary | ICD-10-CM | POA: Diagnosis not present

## 2019-02-12 DIAGNOSIS — R1312 Dysphagia, oropharyngeal phase: Secondary | ICD-10-CM | POA: Diagnosis not present

## 2019-02-12 DIAGNOSIS — I639 Cerebral infarction, unspecified: Secondary | ICD-10-CM | POA: Diagnosis not present

## 2019-02-12 DIAGNOSIS — I629 Nontraumatic intracranial hemorrhage, unspecified: Secondary | ICD-10-CM | POA: Diagnosis not present

## 2019-02-12 DIAGNOSIS — R41841 Cognitive communication deficit: Secondary | ICD-10-CM | POA: Diagnosis not present

## 2019-02-13 DIAGNOSIS — R41841 Cognitive communication deficit: Secondary | ICD-10-CM | POA: Diagnosis not present

## 2019-02-13 DIAGNOSIS — M6281 Muscle weakness (generalized): Secondary | ICD-10-CM | POA: Diagnosis not present

## 2019-02-13 DIAGNOSIS — I629 Nontraumatic intracranial hemorrhage, unspecified: Secondary | ICD-10-CM | POA: Diagnosis not present

## 2019-02-13 DIAGNOSIS — R1312 Dysphagia, oropharyngeal phase: Secondary | ICD-10-CM | POA: Diagnosis not present

## 2019-02-13 DIAGNOSIS — I639 Cerebral infarction, unspecified: Secondary | ICD-10-CM | POA: Diagnosis not present

## 2019-02-16 DIAGNOSIS — I629 Nontraumatic intracranial hemorrhage, unspecified: Secondary | ICD-10-CM | POA: Diagnosis not present

## 2019-02-16 DIAGNOSIS — R41841 Cognitive communication deficit: Secondary | ICD-10-CM | POA: Diagnosis not present

## 2019-02-16 DIAGNOSIS — R1312 Dysphagia, oropharyngeal phase: Secondary | ICD-10-CM | POA: Diagnosis not present

## 2019-02-16 DIAGNOSIS — I639 Cerebral infarction, unspecified: Secondary | ICD-10-CM | POA: Diagnosis not present

## 2019-02-16 DIAGNOSIS — M6281 Muscle weakness (generalized): Secondary | ICD-10-CM | POA: Diagnosis not present

## 2019-02-18 DIAGNOSIS — M6281 Muscle weakness (generalized): Secondary | ICD-10-CM | POA: Diagnosis not present

## 2019-02-18 DIAGNOSIS — R1312 Dysphagia, oropharyngeal phase: Secondary | ICD-10-CM | POA: Diagnosis not present

## 2019-02-18 DIAGNOSIS — I629 Nontraumatic intracranial hemorrhage, unspecified: Secondary | ICD-10-CM | POA: Diagnosis not present

## 2019-02-18 DIAGNOSIS — I639 Cerebral infarction, unspecified: Secondary | ICD-10-CM | POA: Diagnosis not present

## 2019-02-18 DIAGNOSIS — R41841 Cognitive communication deficit: Secondary | ICD-10-CM | POA: Diagnosis not present

## 2019-02-19 DIAGNOSIS — R41841 Cognitive communication deficit: Secondary | ICD-10-CM | POA: Diagnosis not present

## 2019-02-19 DIAGNOSIS — M6281 Muscle weakness (generalized): Secondary | ICD-10-CM | POA: Diagnosis not present

## 2019-02-19 DIAGNOSIS — I629 Nontraumatic intracranial hemorrhage, unspecified: Secondary | ICD-10-CM | POA: Diagnosis not present

## 2019-02-19 DIAGNOSIS — I639 Cerebral infarction, unspecified: Secondary | ICD-10-CM | POA: Diagnosis not present

## 2019-02-19 DIAGNOSIS — R1312 Dysphagia, oropharyngeal phase: Secondary | ICD-10-CM | POA: Diagnosis not present

## 2019-02-20 DIAGNOSIS — R41841 Cognitive communication deficit: Secondary | ICD-10-CM | POA: Diagnosis not present

## 2019-02-20 DIAGNOSIS — M6281 Muscle weakness (generalized): Secondary | ICD-10-CM | POA: Diagnosis not present

## 2019-02-20 DIAGNOSIS — I629 Nontraumatic intracranial hemorrhage, unspecified: Secondary | ICD-10-CM | POA: Diagnosis not present

## 2019-02-20 DIAGNOSIS — I639 Cerebral infarction, unspecified: Secondary | ICD-10-CM | POA: Diagnosis not present

## 2019-02-20 DIAGNOSIS — R1312 Dysphagia, oropharyngeal phase: Secondary | ICD-10-CM | POA: Diagnosis not present

## 2019-02-23 DIAGNOSIS — M6281 Muscle weakness (generalized): Secondary | ICD-10-CM | POA: Diagnosis not present

## 2019-02-23 DIAGNOSIS — R1312 Dysphagia, oropharyngeal phase: Secondary | ICD-10-CM | POA: Diagnosis not present

## 2019-02-23 DIAGNOSIS — I629 Nontraumatic intracranial hemorrhage, unspecified: Secondary | ICD-10-CM | POA: Diagnosis not present

## 2019-02-23 DIAGNOSIS — R41841 Cognitive communication deficit: Secondary | ICD-10-CM | POA: Diagnosis not present

## 2019-02-23 DIAGNOSIS — I639 Cerebral infarction, unspecified: Secondary | ICD-10-CM | POA: Diagnosis not present

## 2019-02-24 DIAGNOSIS — I2510 Atherosclerotic heart disease of native coronary artery without angina pectoris: Secondary | ICD-10-CM | POA: Diagnosis not present

## 2019-02-24 DIAGNOSIS — E785 Hyperlipidemia, unspecified: Secondary | ICD-10-CM | POA: Diagnosis not present

## 2019-02-24 DIAGNOSIS — I639 Cerebral infarction, unspecified: Secondary | ICD-10-CM | POA: Diagnosis not present

## 2019-02-24 DIAGNOSIS — N183 Chronic kidney disease, stage 3 (moderate): Secondary | ICD-10-CM | POA: Diagnosis not present

## 2019-02-24 DIAGNOSIS — I629 Nontraumatic intracranial hemorrhage, unspecified: Secondary | ICD-10-CM | POA: Diagnosis not present

## 2019-02-24 DIAGNOSIS — R1312 Dysphagia, oropharyngeal phase: Secondary | ICD-10-CM | POA: Diagnosis not present

## 2019-02-24 DIAGNOSIS — R1310 Dysphagia, unspecified: Secondary | ICD-10-CM | POA: Diagnosis not present

## 2019-02-24 DIAGNOSIS — R41841 Cognitive communication deficit: Secondary | ICD-10-CM | POA: Diagnosis not present

## 2019-02-24 DIAGNOSIS — M6281 Muscle weakness (generalized): Secondary | ICD-10-CM | POA: Diagnosis not present

## 2019-02-25 DIAGNOSIS — I629 Nontraumatic intracranial hemorrhage, unspecified: Secondary | ICD-10-CM | POA: Diagnosis not present

## 2019-02-25 DIAGNOSIS — R1312 Dysphagia, oropharyngeal phase: Secondary | ICD-10-CM | POA: Diagnosis not present

## 2019-02-25 DIAGNOSIS — M6281 Muscle weakness (generalized): Secondary | ICD-10-CM | POA: Diagnosis not present

## 2019-02-25 DIAGNOSIS — R41841 Cognitive communication deficit: Secondary | ICD-10-CM | POA: Diagnosis not present

## 2019-02-25 DIAGNOSIS — I639 Cerebral infarction, unspecified: Secondary | ICD-10-CM | POA: Diagnosis not present

## 2019-02-26 DIAGNOSIS — R1312 Dysphagia, oropharyngeal phase: Secondary | ICD-10-CM | POA: Diagnosis not present

## 2019-02-26 DIAGNOSIS — M6281 Muscle weakness (generalized): Secondary | ICD-10-CM | POA: Diagnosis not present

## 2019-02-26 DIAGNOSIS — R41841 Cognitive communication deficit: Secondary | ICD-10-CM | POA: Diagnosis not present

## 2019-02-26 DIAGNOSIS — I629 Nontraumatic intracranial hemorrhage, unspecified: Secondary | ICD-10-CM | POA: Diagnosis not present

## 2019-02-26 DIAGNOSIS — I639 Cerebral infarction, unspecified: Secondary | ICD-10-CM | POA: Diagnosis not present

## 2019-02-27 DIAGNOSIS — M6281 Muscle weakness (generalized): Secondary | ICD-10-CM | POA: Diagnosis not present

## 2019-02-27 DIAGNOSIS — I69354 Hemiplegia and hemiparesis following cerebral infarction affecting left non-dominant side: Secondary | ICD-10-CM | POA: Diagnosis not present

## 2019-02-27 DIAGNOSIS — I639 Cerebral infarction, unspecified: Secondary | ICD-10-CM | POA: Diagnosis not present

## 2019-03-02 DIAGNOSIS — I69354 Hemiplegia and hemiparesis following cerebral infarction affecting left non-dominant side: Secondary | ICD-10-CM | POA: Diagnosis not present

## 2019-03-02 DIAGNOSIS — M6281 Muscle weakness (generalized): Secondary | ICD-10-CM | POA: Diagnosis not present

## 2019-03-02 DIAGNOSIS — I639 Cerebral infarction, unspecified: Secondary | ICD-10-CM | POA: Diagnosis not present

## 2019-03-03 DIAGNOSIS — I639 Cerebral infarction, unspecified: Secondary | ICD-10-CM | POA: Diagnosis not present

## 2019-03-03 DIAGNOSIS — M6281 Muscle weakness (generalized): Secondary | ICD-10-CM | POA: Diagnosis not present

## 2019-03-03 DIAGNOSIS — I69354 Hemiplegia and hemiparesis following cerebral infarction affecting left non-dominant side: Secondary | ICD-10-CM | POA: Diagnosis not present

## 2019-03-04 DIAGNOSIS — M6281 Muscle weakness (generalized): Secondary | ICD-10-CM | POA: Diagnosis not present

## 2019-03-04 DIAGNOSIS — I639 Cerebral infarction, unspecified: Secondary | ICD-10-CM | POA: Diagnosis not present

## 2019-03-04 DIAGNOSIS — I69354 Hemiplegia and hemiparesis following cerebral infarction affecting left non-dominant side: Secondary | ICD-10-CM | POA: Diagnosis not present

## 2019-03-05 DIAGNOSIS — M6281 Muscle weakness (generalized): Secondary | ICD-10-CM | POA: Diagnosis not present

## 2019-03-05 DIAGNOSIS — I69354 Hemiplegia and hemiparesis following cerebral infarction affecting left non-dominant side: Secondary | ICD-10-CM | POA: Diagnosis not present

## 2019-03-05 DIAGNOSIS — I639 Cerebral infarction, unspecified: Secondary | ICD-10-CM | POA: Diagnosis not present

## 2019-03-26 DIAGNOSIS — I119 Hypertensive heart disease without heart failure: Secondary | ICD-10-CM | POA: Diagnosis not present

## 2019-03-26 DIAGNOSIS — D649 Anemia, unspecified: Secondary | ICD-10-CM | POA: Diagnosis not present

## 2019-03-26 DIAGNOSIS — J449 Chronic obstructive pulmonary disease, unspecified: Secondary | ICD-10-CM | POA: Diagnosis not present

## 2019-03-26 DIAGNOSIS — M2550 Pain in unspecified joint: Secondary | ICD-10-CM | POA: Diagnosis not present

## 2019-03-27 DIAGNOSIS — Z20828 Contact with and (suspected) exposure to other viral communicable diseases: Secondary | ICD-10-CM | POA: Diagnosis not present

## 2019-03-31 DIAGNOSIS — Z79899 Other long term (current) drug therapy: Secondary | ICD-10-CM | POA: Diagnosis not present

## 2019-03-31 DIAGNOSIS — E039 Hypothyroidism, unspecified: Secondary | ICD-10-CM | POA: Diagnosis not present

## 2019-03-31 DIAGNOSIS — D649 Anemia, unspecified: Secondary | ICD-10-CM | POA: Diagnosis not present

## 2019-03-31 DIAGNOSIS — R0602 Shortness of breath: Secondary | ICD-10-CM | POA: Diagnosis not present

## 2019-03-31 DIAGNOSIS — D519 Vitamin B12 deficiency anemia, unspecified: Secondary | ICD-10-CM | POA: Diagnosis not present

## 2019-04-10 DIAGNOSIS — Z20828 Contact with and (suspected) exposure to other viral communicable diseases: Secondary | ICD-10-CM | POA: Diagnosis not present

## 2019-04-18 DIAGNOSIS — Z20828 Contact with and (suspected) exposure to other viral communicable diseases: Secondary | ICD-10-CM | POA: Diagnosis not present

## 2019-04-26 DIAGNOSIS — I639 Cerebral infarction, unspecified: Secondary | ICD-10-CM | POA: Diagnosis not present

## 2019-04-26 DIAGNOSIS — D649 Anemia, unspecified: Secondary | ICD-10-CM | POA: Diagnosis not present

## 2019-04-26 DIAGNOSIS — I5032 Chronic diastolic (congestive) heart failure: Secondary | ICD-10-CM | POA: Diagnosis not present

## 2019-04-26 DIAGNOSIS — R5381 Other malaise: Secondary | ICD-10-CM | POA: Diagnosis not present

## 2019-05-26 DIAGNOSIS — I69391 Dysphagia following cerebral infarction: Secondary | ICD-10-CM | POA: Diagnosis not present

## 2019-05-26 DIAGNOSIS — J9621 Acute and chronic respiratory failure with hypoxia: Secondary | ICD-10-CM | POA: Diagnosis not present

## 2019-05-26 DIAGNOSIS — N183 Chronic kidney disease, stage 3 (moderate): Secondary | ICD-10-CM | POA: Diagnosis not present

## 2019-05-26 DIAGNOSIS — I2510 Atherosclerotic heart disease of native coronary artery without angina pectoris: Secondary | ICD-10-CM | POA: Diagnosis not present

## 2019-06-02 DIAGNOSIS — C8510 Unspecified B-cell lymphoma, unspecified site: Secondary | ICD-10-CM | POA: Diagnosis not present

## 2019-06-02 DIAGNOSIS — D649 Anemia, unspecified: Secondary | ICD-10-CM | POA: Diagnosis not present

## 2019-06-16 DIAGNOSIS — Z20828 Contact with and (suspected) exposure to other viral communicable diseases: Secondary | ICD-10-CM | POA: Diagnosis not present

## 2019-06-23 DIAGNOSIS — Z20828 Contact with and (suspected) exposure to other viral communicable diseases: Secondary | ICD-10-CM | POA: Diagnosis not present

## 2019-06-26 DIAGNOSIS — I639 Cerebral infarction, unspecified: Secondary | ICD-10-CM | POA: Diagnosis not present

## 2019-06-26 DIAGNOSIS — N183 Chronic kidney disease, stage 3 (moderate): Secondary | ICD-10-CM | POA: Diagnosis not present

## 2019-06-26 DIAGNOSIS — R1310 Dysphagia, unspecified: Secondary | ICD-10-CM | POA: Diagnosis not present

## 2019-06-26 DIAGNOSIS — I2510 Atherosclerotic heart disease of native coronary artery without angina pectoris: Secondary | ICD-10-CM | POA: Diagnosis not present

## 2019-07-15 IMAGING — MR MR HEAD W/O CM
9 of 10 series · 36 of 48 positions shown · non-contrast
Comparison: Head CT 12/12/2018

CLINICAL DATA: History of dementia. Code stroke presentation
yesterday with intracranial hemorrhage.

EXAM:
MRI HEAD WITHOUT CONTRAST
TECHNIQUE: Multiplanar, multiecho pulse sequences of the brain and surrounding
structures were obtained without intravenous contrast.

[Series 3: DWI · axial · 3.0mm · 1.09mm/px · z∈[-98,+48]mm · 9 of 100 slices shown (1 of 4)]
[im 1/100]
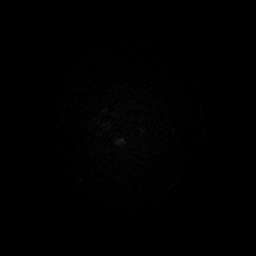
[im 13/100]
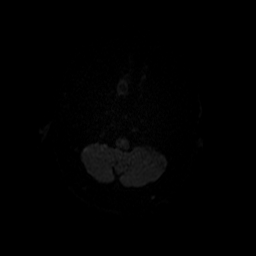
[im 25/100]
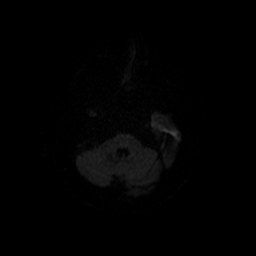
[im 38/100]
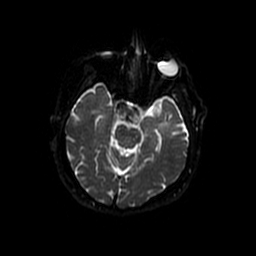
[im 50/100]
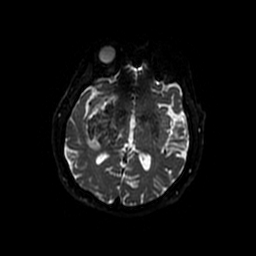
[im 62/100]
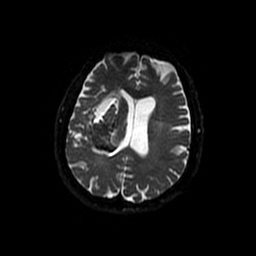
[im 75/100]
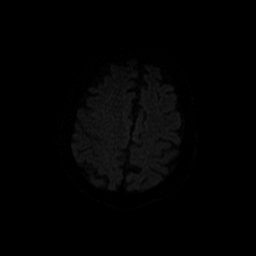
[im 87/100]
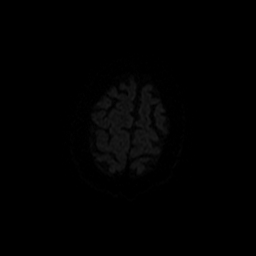
[im 100/100]
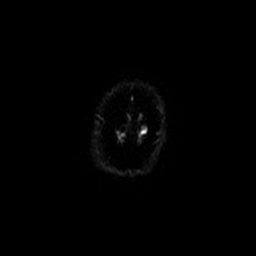

[Series 4: T1 · sagittal · 5.0mm · 0.47mm/px · 2 of 23 slices shown]
[im 1/23]
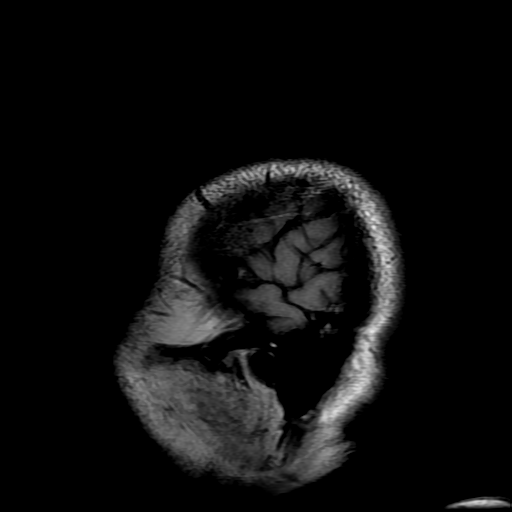
[im 23/23]
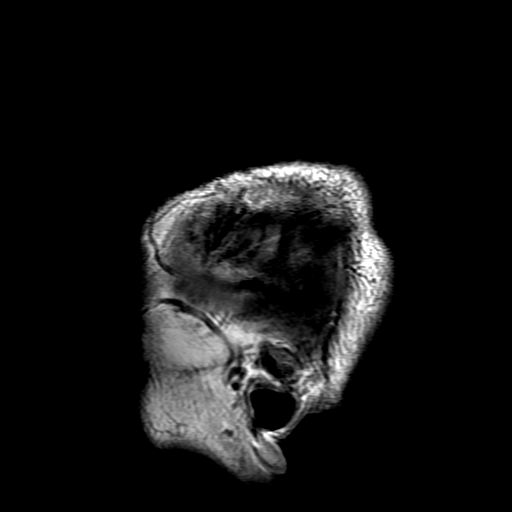

[Series 5: DWI · coronal · 5.0mm · 1.09mm/px · 7 of 66 slices shown (2 of 4)]
[im 1/66]
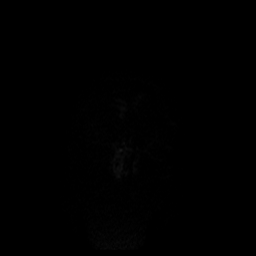
[im 11/66]
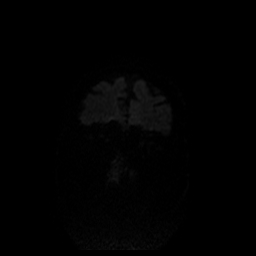
[im 22/66]
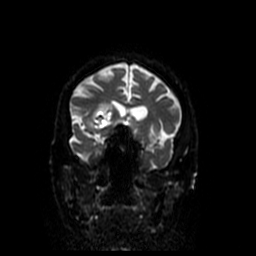
[im 33/66]
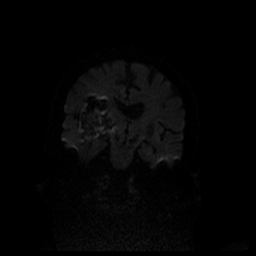
[im 44/66]
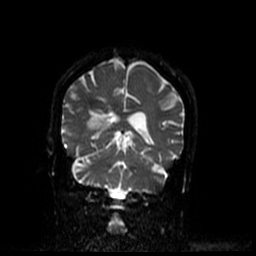
[im 55/66]
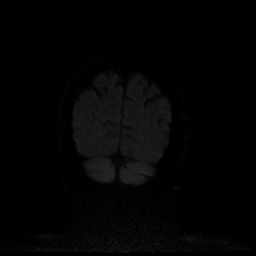
[im 66/66]
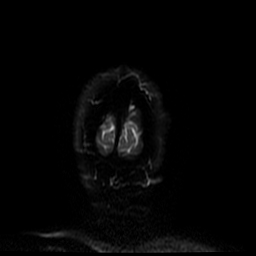

[Series 6: FLAIR · axial · 5.0mm · 0.43mm/px · z∈[-77,+67]mm · 3 of 25 slices shown]
[im 1/25]
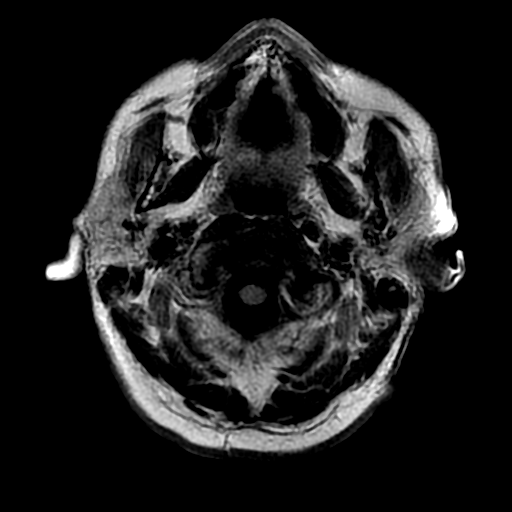
[im 13/25]
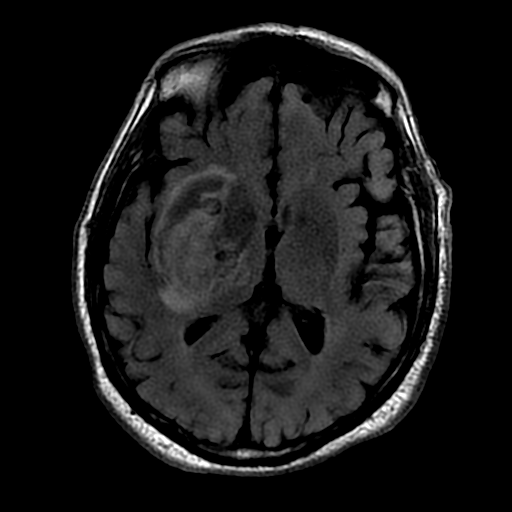
[im 25/25]
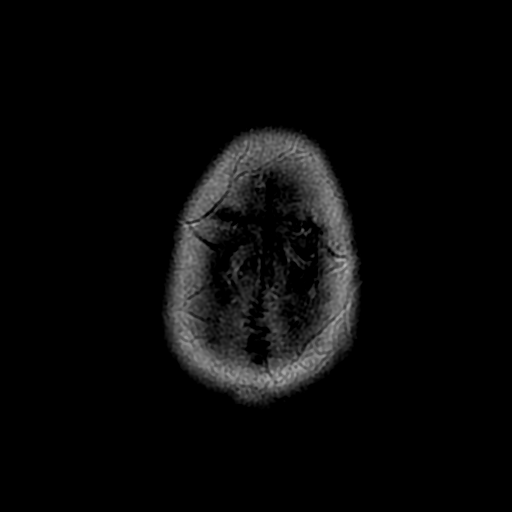

[Series 7: T2 · axial · 5.0mm · 0.43mm/px · z∈[-80,+64]mm · 3 of 25 slices shown]
[im 1/25]
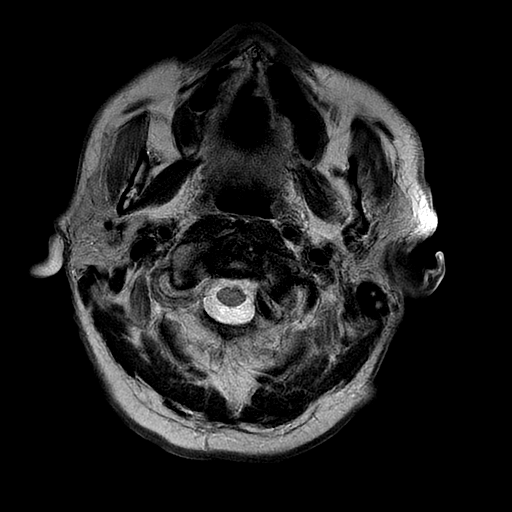
[im 13/25]
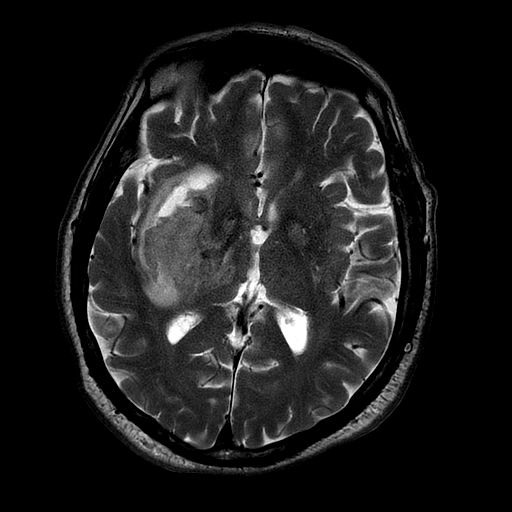
[im 25/25]
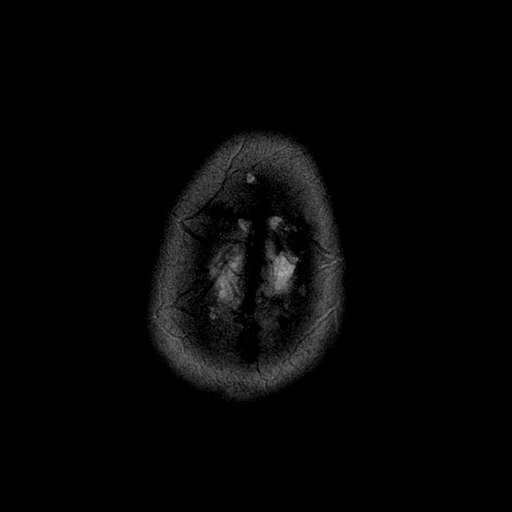

[Series 8: ax mpgr · axial · 5.0mm · 0.43mm/px · 1 of 25 slices shown]
[im 1/25]
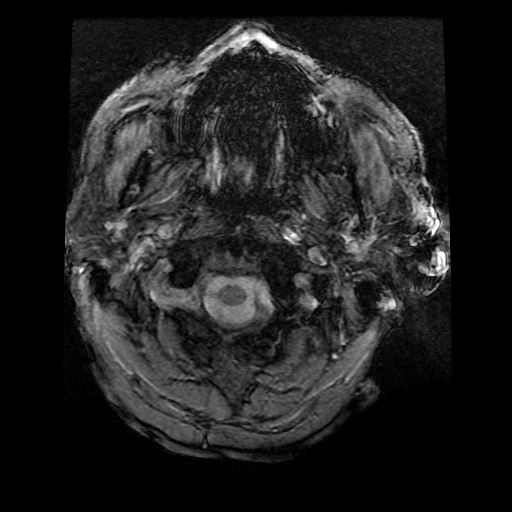

[Series 10: T2 post-contrast · coronal · 5.0mm · 0.39mm/px · 3 of 25 slices shown]
[im 1/25]
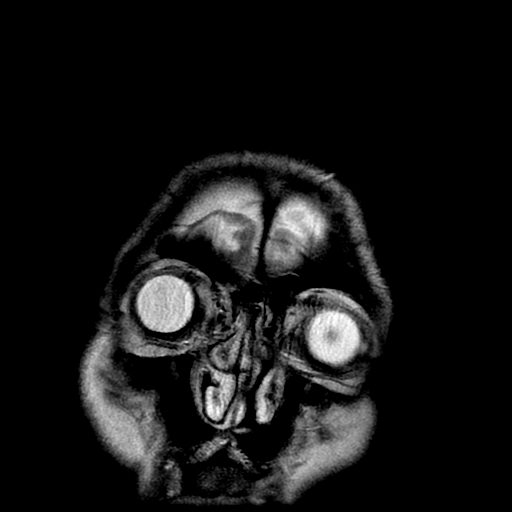
[im 13/25]
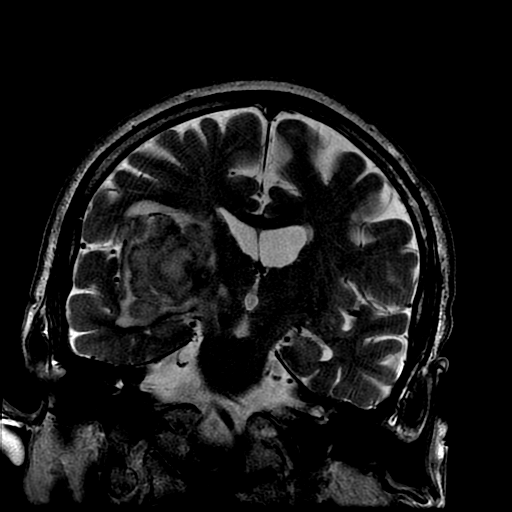
[im 25/25]
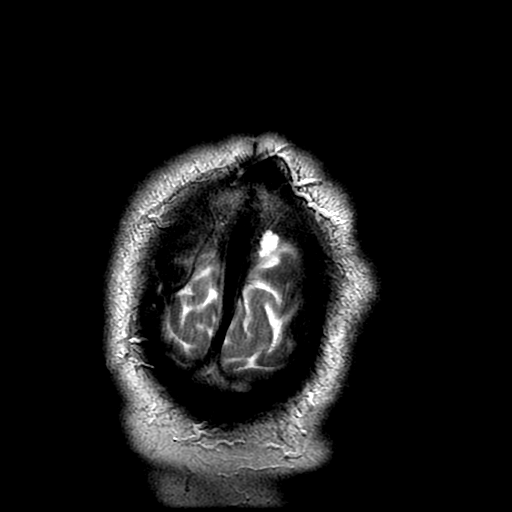

[Series 300: DWI · axial · 3.0mm · 1.09mm/px · z∈[-98,+48]mm · 5 of 50 slices shown (3 of 4)]
[im 1/50]
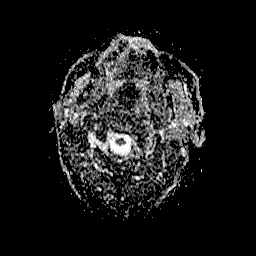
[im 13/50]
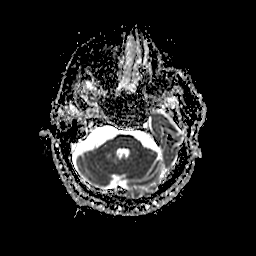
[im 25/50]
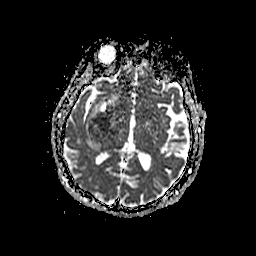
[im 37/50]
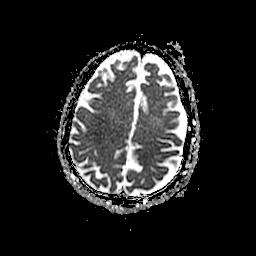
[im 50/50]
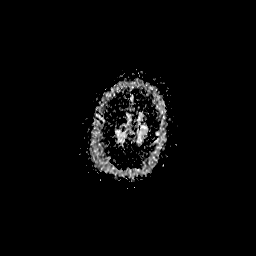

[Series 500: DWI · coronal · 5.0mm · 1.09mm/px · 3 of 33 slices shown (4 of 4)]
[im 1/33]
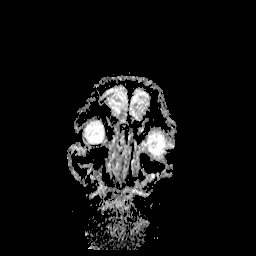
[im 17/33]
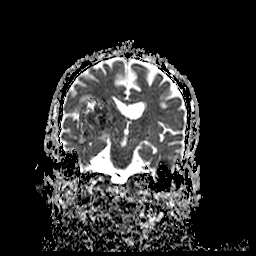
[im 33/33]
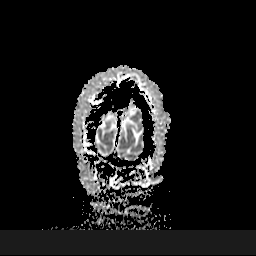

[36 of 48 positions shown; findings below may reference images not displayed]

FINDINGS: Brain: Redemonstration an intraparenchymal hemorrhage in the right
basal ganglia region measuring approximately 5 x 3.2 x 4.3 cm.
Surrounding edema. Mild mass effect with some flattening of the
right lateral ventricle and right-to-left shift of 3 mm. There does
not appear to have been additional bleeding since yesterday. There
is a tiny amount of blood layering in the occipital horns of the
lateral ventricles. The brain elsewhere shows only mild chronic
small-vessel change of the white matter. No sign of an underlying
mass or vascular malformation. No obstructive hydrocephalus. No
extra-axial collection.

Vascular: Major vessels at the base of the brain show flow.

Skull and upper cervical spine: Negative

Sinuses/Orbits: Clear/normal

Other: None
IMPRESSION: No evidence of additional bleeding related to intraparenchymal
hematoma with its epicenter in the right basal ganglia region,
measuring approximately 5 x 3.2 x 4.3 cm, with surrounding edema.
Mild flattening of the right lateral ventricle and right-to-left
shift of 3 mm. No sign of underlying neoplastic or vascular lesion.

## 2019-07-28 DIAGNOSIS — D649 Anemia, unspecified: Secondary | ICD-10-CM | POA: Diagnosis not present

## 2019-07-28 DIAGNOSIS — I5032 Chronic diastolic (congestive) heart failure: Secondary | ICD-10-CM | POA: Diagnosis not present

## 2019-07-28 DIAGNOSIS — M2550 Pain in unspecified joint: Secondary | ICD-10-CM | POA: Diagnosis not present

## 2019-07-28 DIAGNOSIS — I119 Hypertensive heart disease without heart failure: Secondary | ICD-10-CM | POA: Diagnosis not present

## 2019-08-10 DIAGNOSIS — Z23 Encounter for immunization: Secondary | ICD-10-CM | POA: Diagnosis not present

## 2019-08-11 DIAGNOSIS — Z20828 Contact with and (suspected) exposure to other viral communicable diseases: Secondary | ICD-10-CM | POA: Diagnosis not present

## 2019-08-18 DIAGNOSIS — Z20828 Contact with and (suspected) exposure to other viral communicable diseases: Secondary | ICD-10-CM | POA: Diagnosis not present

## 2019-08-25 DIAGNOSIS — Z20828 Contact with and (suspected) exposure to other viral communicable diseases: Secondary | ICD-10-CM | POA: Diagnosis not present

## 2019-08-27 DIAGNOSIS — R5381 Other malaise: Secondary | ICD-10-CM | POA: Diagnosis not present

## 2019-08-27 DIAGNOSIS — I5032 Chronic diastolic (congestive) heart failure: Secondary | ICD-10-CM | POA: Diagnosis not present

## 2019-08-27 DIAGNOSIS — D649 Anemia, unspecified: Secondary | ICD-10-CM | POA: Diagnosis not present

## 2019-08-27 DIAGNOSIS — I639 Cerebral infarction, unspecified: Secondary | ICD-10-CM | POA: Diagnosis not present

## 2019-09-01 DIAGNOSIS — Z20828 Contact with and (suspected) exposure to other viral communicable diseases: Secondary | ICD-10-CM | POA: Diagnosis not present

## 2019-09-08 DIAGNOSIS — Z20828 Contact with and (suspected) exposure to other viral communicable diseases: Secondary | ICD-10-CM | POA: Diagnosis not present

## 2019-09-15 DIAGNOSIS — Z20828 Contact with and (suspected) exposure to other viral communicable diseases: Secondary | ICD-10-CM | POA: Diagnosis not present

## 2019-09-16 DIAGNOSIS — F419 Anxiety disorder, unspecified: Secondary | ICD-10-CM | POA: Diagnosis not present

## 2019-09-16 DIAGNOSIS — F0391 Unspecified dementia with behavioral disturbance: Secondary | ICD-10-CM | POA: Diagnosis not present

## 2019-09-18 DIAGNOSIS — Z20828 Contact with and (suspected) exposure to other viral communicable diseases: Secondary | ICD-10-CM | POA: Diagnosis not present

## 2019-09-20 DIAGNOSIS — R05 Cough: Secondary | ICD-10-CM | POA: Diagnosis not present

## 2019-09-21 DIAGNOSIS — Z20828 Contact with and (suspected) exposure to other viral communicable diseases: Secondary | ICD-10-CM | POA: Diagnosis not present

## 2019-09-21 DIAGNOSIS — D649 Anemia, unspecified: Secondary | ICD-10-CM | POA: Diagnosis not present

## 2019-09-22 DIAGNOSIS — J9621 Acute and chronic respiratory failure with hypoxia: Secondary | ICD-10-CM | POA: Diagnosis not present

## 2019-09-22 DIAGNOSIS — N183 Chronic kidney disease, stage 3 unspecified: Secondary | ICD-10-CM | POA: Diagnosis not present

## 2019-09-22 DIAGNOSIS — B342 Coronavirus infection, unspecified: Secondary | ICD-10-CM | POA: Diagnosis not present

## 2019-09-22 DIAGNOSIS — J189 Pneumonia, unspecified organism: Secondary | ICD-10-CM | POA: Diagnosis not present

## 2019-09-30 DIAGNOSIS — D649 Anemia, unspecified: Secondary | ICD-10-CM | POA: Diagnosis not present

## 2019-09-30 DIAGNOSIS — Z79899 Other long term (current) drug therapy: Secondary | ICD-10-CM | POA: Diagnosis not present

## 2019-09-30 DIAGNOSIS — Z20828 Contact with and (suspected) exposure to other viral communicable diseases: Secondary | ICD-10-CM | POA: Diagnosis not present

## 2019-10-07 DIAGNOSIS — I10 Essential (primary) hypertension: Secondary | ICD-10-CM | POA: Diagnosis not present

## 2019-10-26 DIAGNOSIS — I2510 Atherosclerotic heart disease of native coronary artery without angina pectoris: Secondary | ICD-10-CM | POA: Diagnosis not present

## 2019-10-26 DIAGNOSIS — R1310 Dysphagia, unspecified: Secondary | ICD-10-CM | POA: Diagnosis not present

## 2019-10-26 DIAGNOSIS — R5381 Other malaise: Secondary | ICD-10-CM | POA: Diagnosis not present

## 2019-10-26 DIAGNOSIS — J9621 Acute and chronic respiratory failure with hypoxia: Secondary | ICD-10-CM | POA: Diagnosis not present

## 2019-11-02 DIAGNOSIS — Z23 Encounter for immunization: Secondary | ICD-10-CM | POA: Diagnosis not present

## 2019-11-27 DIAGNOSIS — N1831 Chronic kidney disease, stage 3a: Secondary | ICD-10-CM | POA: Diagnosis not present

## 2019-11-27 DIAGNOSIS — E785 Hyperlipidemia, unspecified: Secondary | ICD-10-CM | POA: Diagnosis not present

## 2019-11-27 DIAGNOSIS — I639 Cerebral infarction, unspecified: Secondary | ICD-10-CM | POA: Diagnosis not present

## 2019-11-27 DIAGNOSIS — D649 Anemia, unspecified: Secondary | ICD-10-CM | POA: Diagnosis not present

## 2019-11-30 DIAGNOSIS — Z23 Encounter for immunization: Secondary | ICD-10-CM | POA: Diagnosis not present

## 2019-12-21 DIAGNOSIS — Z20828 Contact with and (suspected) exposure to other viral communicable diseases: Secondary | ICD-10-CM | POA: Diagnosis not present

## 2019-12-24 DIAGNOSIS — Z20828 Contact with and (suspected) exposure to other viral communicable diseases: Secondary | ICD-10-CM | POA: Diagnosis not present

## 2019-12-27 DIAGNOSIS — D649 Anemia, unspecified: Secondary | ICD-10-CM | POA: Diagnosis not present

## 2019-12-27 DIAGNOSIS — I5032 Chronic diastolic (congestive) heart failure: Secondary | ICD-10-CM | POA: Diagnosis not present

## 2019-12-27 DIAGNOSIS — I119 Hypertensive heart disease without heart failure: Secondary | ICD-10-CM | POA: Diagnosis not present

## 2019-12-27 DIAGNOSIS — J449 Chronic obstructive pulmonary disease, unspecified: Secondary | ICD-10-CM | POA: Diagnosis not present

## 2019-12-28 DIAGNOSIS — Z20828 Contact with and (suspected) exposure to other viral communicable diseases: Secondary | ICD-10-CM | POA: Diagnosis not present

## 2019-12-31 DIAGNOSIS — Z20828 Contact with and (suspected) exposure to other viral communicable diseases: Secondary | ICD-10-CM | POA: Diagnosis not present

## 2020-01-04 DIAGNOSIS — Z20828 Contact with and (suspected) exposure to other viral communicable diseases: Secondary | ICD-10-CM | POA: Diagnosis not present

## 2020-01-11 DIAGNOSIS — Z20828 Contact with and (suspected) exposure to other viral communicable diseases: Secondary | ICD-10-CM | POA: Diagnosis not present

## 2020-01-25 DIAGNOSIS — D649 Anemia, unspecified: Secondary | ICD-10-CM | POA: Diagnosis not present

## 2020-01-25 DIAGNOSIS — R1310 Dysphagia, unspecified: Secondary | ICD-10-CM | POA: Diagnosis not present

## 2020-01-25 DIAGNOSIS — J449 Chronic obstructive pulmonary disease, unspecified: Secondary | ICD-10-CM | POA: Diagnosis not present

## 2020-01-25 DIAGNOSIS — I119 Hypertensive heart disease without heart failure: Secondary | ICD-10-CM | POA: Diagnosis not present

## 2020-02-01 DIAGNOSIS — Z20828 Contact with and (suspected) exposure to other viral communicable diseases: Secondary | ICD-10-CM | POA: Diagnosis not present

## 2020-02-25 DIAGNOSIS — N1831 Chronic kidney disease, stage 3a: Secondary | ICD-10-CM | POA: Diagnosis not present

## 2020-02-25 DIAGNOSIS — R5381 Other malaise: Secondary | ICD-10-CM | POA: Diagnosis not present

## 2020-02-25 DIAGNOSIS — I69354 Hemiplegia and hemiparesis following cerebral infarction affecting left non-dominant side: Secondary | ICD-10-CM | POA: Diagnosis not present

## 2020-02-25 DIAGNOSIS — I5032 Chronic diastolic (congestive) heart failure: Secondary | ICD-10-CM | POA: Diagnosis not present

## 2020-05-21 DIAGNOSIS — D649 Anemia, unspecified: Secondary | ICD-10-CM | POA: Diagnosis not present

## 2020-05-21 DIAGNOSIS — E785 Hyperlipidemia, unspecified: Secondary | ICD-10-CM | POA: Diagnosis not present

## 2020-05-21 DIAGNOSIS — E559 Vitamin D deficiency, unspecified: Secondary | ICD-10-CM | POA: Diagnosis not present

## 2020-05-21 DIAGNOSIS — I629 Nontraumatic intracranial hemorrhage, unspecified: Secondary | ICD-10-CM | POA: Diagnosis not present

## 2020-05-21 DIAGNOSIS — I5022 Chronic systolic (congestive) heart failure: Secondary | ICD-10-CM | POA: Diagnosis not present

## 2020-05-28 DIAGNOSIS — I119 Hypertensive heart disease without heart failure: Secondary | ICD-10-CM | POA: Diagnosis not present

## 2020-05-28 DIAGNOSIS — D649 Anemia, unspecified: Secondary | ICD-10-CM | POA: Diagnosis not present

## 2020-05-28 DIAGNOSIS — N1831 Chronic kidney disease, stage 3a: Secondary | ICD-10-CM | POA: Diagnosis not present

## 2020-05-28 DIAGNOSIS — J449 Chronic obstructive pulmonary disease, unspecified: Secondary | ICD-10-CM | POA: Diagnosis not present

## 2020-06-26 DIAGNOSIS — R5381 Other malaise: Secondary | ICD-10-CM | POA: Diagnosis not present

## 2020-06-26 DIAGNOSIS — I69091 Dysphagia following nontraumatic subarachnoid hemorrhage: Secondary | ICD-10-CM | POA: Diagnosis not present

## 2020-06-26 DIAGNOSIS — F0390 Unspecified dementia without behavioral disturbance: Secondary | ICD-10-CM | POA: Diagnosis not present

## 2020-06-26 DIAGNOSIS — I5032 Chronic diastolic (congestive) heart failure: Secondary | ICD-10-CM | POA: Diagnosis not present

## 2020-07-27 DIAGNOSIS — I69354 Hemiplegia and hemiparesis following cerebral infarction affecting left non-dominant side: Secondary | ICD-10-CM | POA: Diagnosis not present

## 2020-07-27 DIAGNOSIS — R5381 Other malaise: Secondary | ICD-10-CM | POA: Diagnosis not present

## 2020-07-27 DIAGNOSIS — F0390 Unspecified dementia without behavioral disturbance: Secondary | ICD-10-CM | POA: Diagnosis not present

## 2020-07-27 DIAGNOSIS — J9611 Chronic respiratory failure with hypoxia: Secondary | ICD-10-CM | POA: Diagnosis not present

## 2020-08-26 DIAGNOSIS — I2510 Atherosclerotic heart disease of native coronary artery without angina pectoris: Secondary | ICD-10-CM | POA: Diagnosis not present

## 2020-08-26 DIAGNOSIS — E785 Hyperlipidemia, unspecified: Secondary | ICD-10-CM | POA: Diagnosis not present

## 2020-08-26 DIAGNOSIS — N1831 Chronic kidney disease, stage 3a: Secondary | ICD-10-CM | POA: Diagnosis not present

## 2020-08-26 DIAGNOSIS — R1310 Dysphagia, unspecified: Secondary | ICD-10-CM | POA: Diagnosis not present

## 2020-09-26 DIAGNOSIS — D649 Anemia, unspecified: Secondary | ICD-10-CM | POA: Diagnosis not present

## 2020-09-26 DIAGNOSIS — I119 Hypertensive heart disease without heart failure: Secondary | ICD-10-CM | POA: Diagnosis not present

## 2020-09-26 DIAGNOSIS — M2550 Pain in unspecified joint: Secondary | ICD-10-CM | POA: Diagnosis not present

## 2020-09-26 DIAGNOSIS — J449 Chronic obstructive pulmonary disease, unspecified: Secondary | ICD-10-CM | POA: Diagnosis not present

## 2020-10-18 DIAGNOSIS — M24522 Contracture, left elbow: Secondary | ICD-10-CM | POA: Diagnosis not present

## 2020-10-18 DIAGNOSIS — I69354 Hemiplegia and hemiparesis following cerebral infarction affecting left non-dominant side: Secondary | ICD-10-CM | POA: Diagnosis not present

## 2020-10-18 DIAGNOSIS — M62442 Contracture of muscle, left hand: Secondary | ICD-10-CM | POA: Diagnosis not present

## 2020-10-19 DIAGNOSIS — M62442 Contracture of muscle, left hand: Secondary | ICD-10-CM | POA: Diagnosis not present

## 2020-10-19 DIAGNOSIS — M24522 Contracture, left elbow: Secondary | ICD-10-CM | POA: Diagnosis not present

## 2020-10-19 DIAGNOSIS — I69354 Hemiplegia and hemiparesis following cerebral infarction affecting left non-dominant side: Secondary | ICD-10-CM | POA: Diagnosis not present

## 2020-10-21 DIAGNOSIS — E46 Unspecified protein-calorie malnutrition: Secondary | ICD-10-CM | POA: Diagnosis not present

## 2020-10-21 DIAGNOSIS — E569 Vitamin deficiency, unspecified: Secondary | ICD-10-CM | POA: Diagnosis not present

## 2020-10-21 DIAGNOSIS — D649 Anemia, unspecified: Secondary | ICD-10-CM | POA: Diagnosis not present

## 2020-10-21 DIAGNOSIS — I5022 Chronic systolic (congestive) heart failure: Secondary | ICD-10-CM | POA: Diagnosis not present

## 2020-10-21 DIAGNOSIS — E559 Vitamin D deficiency, unspecified: Secondary | ICD-10-CM | POA: Diagnosis not present

## 2020-10-21 DIAGNOSIS — Z79899 Other long term (current) drug therapy: Secondary | ICD-10-CM | POA: Diagnosis not present

## 2020-10-21 DIAGNOSIS — I10 Essential (primary) hypertension: Secondary | ICD-10-CM | POA: Diagnosis not present

## 2020-10-21 DIAGNOSIS — E785 Hyperlipidemia, unspecified: Secondary | ICD-10-CM | POA: Diagnosis not present

## 2020-10-26 DIAGNOSIS — D649 Anemia, unspecified: Secondary | ICD-10-CM | POA: Diagnosis not present

## 2020-10-26 DIAGNOSIS — I69354 Hemiplegia and hemiparesis following cerebral infarction affecting left non-dominant side: Secondary | ICD-10-CM | POA: Diagnosis not present

## 2020-10-26 DIAGNOSIS — R5381 Other malaise: Secondary | ICD-10-CM | POA: Diagnosis not present

## 2020-10-26 DIAGNOSIS — I5032 Chronic diastolic (congestive) heart failure: Secondary | ICD-10-CM | POA: Diagnosis not present

## 2020-11-16 DIAGNOSIS — M62442 Contracture of muscle, left hand: Secondary | ICD-10-CM | POA: Diagnosis not present

## 2020-11-16 DIAGNOSIS — I69354 Hemiplegia and hemiparesis following cerebral infarction affecting left non-dominant side: Secondary | ICD-10-CM | POA: Diagnosis not present

## 2020-11-16 DIAGNOSIS — M24522 Contracture, left elbow: Secondary | ICD-10-CM | POA: Diagnosis not present

## 2020-11-21 DIAGNOSIS — M24522 Contracture, left elbow: Secondary | ICD-10-CM | POA: Diagnosis not present

## 2020-11-21 DIAGNOSIS — M62442 Contracture of muscle, left hand: Secondary | ICD-10-CM | POA: Diagnosis not present

## 2020-11-21 DIAGNOSIS — I69354 Hemiplegia and hemiparesis following cerebral infarction affecting left non-dominant side: Secondary | ICD-10-CM | POA: Diagnosis not present

## 2020-11-26 DIAGNOSIS — R5381 Other malaise: Secondary | ICD-10-CM | POA: Diagnosis not present

## 2020-11-26 DIAGNOSIS — R1310 Dysphagia, unspecified: Secondary | ICD-10-CM | POA: Diagnosis not present

## 2020-11-26 DIAGNOSIS — J9611 Chronic respiratory failure with hypoxia: Secondary | ICD-10-CM | POA: Diagnosis not present

## 2020-11-26 DIAGNOSIS — I2510 Atherosclerotic heart disease of native coronary artery without angina pectoris: Secondary | ICD-10-CM | POA: Diagnosis not present

## 2020-11-28 DIAGNOSIS — I69354 Hemiplegia and hemiparesis following cerebral infarction affecting left non-dominant side: Secondary | ICD-10-CM | POA: Diagnosis not present

## 2020-11-28 DIAGNOSIS — M24522 Contracture, left elbow: Secondary | ICD-10-CM | POA: Diagnosis not present

## 2020-11-28 DIAGNOSIS — M62442 Contracture of muscle, left hand: Secondary | ICD-10-CM | POA: Diagnosis not present

## 2020-11-29 DIAGNOSIS — I69354 Hemiplegia and hemiparesis following cerebral infarction affecting left non-dominant side: Secondary | ICD-10-CM | POA: Diagnosis not present

## 2020-11-29 DIAGNOSIS — M62442 Contracture of muscle, left hand: Secondary | ICD-10-CM | POA: Diagnosis not present

## 2020-11-29 DIAGNOSIS — M24522 Contracture, left elbow: Secondary | ICD-10-CM | POA: Diagnosis not present

## 2020-11-30 DIAGNOSIS — M24522 Contracture, left elbow: Secondary | ICD-10-CM | POA: Diagnosis not present

## 2020-11-30 DIAGNOSIS — I69354 Hemiplegia and hemiparesis following cerebral infarction affecting left non-dominant side: Secondary | ICD-10-CM | POA: Diagnosis not present

## 2020-11-30 DIAGNOSIS — M62442 Contracture of muscle, left hand: Secondary | ICD-10-CM | POA: Diagnosis not present

## 2020-12-01 DIAGNOSIS — I69354 Hemiplegia and hemiparesis following cerebral infarction affecting left non-dominant side: Secondary | ICD-10-CM | POA: Diagnosis not present

## 2020-12-01 DIAGNOSIS — M24522 Contracture, left elbow: Secondary | ICD-10-CM | POA: Diagnosis not present

## 2020-12-01 DIAGNOSIS — M62442 Contracture of muscle, left hand: Secondary | ICD-10-CM | POA: Diagnosis not present

## 2020-12-05 DIAGNOSIS — I69354 Hemiplegia and hemiparesis following cerebral infarction affecting left non-dominant side: Secondary | ICD-10-CM | POA: Diagnosis not present

## 2020-12-05 DIAGNOSIS — M62442 Contracture of muscle, left hand: Secondary | ICD-10-CM | POA: Diagnosis not present

## 2020-12-05 DIAGNOSIS — M24522 Contracture, left elbow: Secondary | ICD-10-CM | POA: Diagnosis not present

## 2020-12-06 DIAGNOSIS — M62442 Contracture of muscle, left hand: Secondary | ICD-10-CM | POA: Diagnosis not present

## 2020-12-06 DIAGNOSIS — M24522 Contracture, left elbow: Secondary | ICD-10-CM | POA: Diagnosis not present

## 2020-12-06 DIAGNOSIS — I69354 Hemiplegia and hemiparesis following cerebral infarction affecting left non-dominant side: Secondary | ICD-10-CM | POA: Diagnosis not present

## 2020-12-08 DIAGNOSIS — I69354 Hemiplegia and hemiparesis following cerebral infarction affecting left non-dominant side: Secondary | ICD-10-CM | POA: Diagnosis not present

## 2020-12-08 DIAGNOSIS — M62442 Contracture of muscle, left hand: Secondary | ICD-10-CM | POA: Diagnosis not present

## 2020-12-08 DIAGNOSIS — M24522 Contracture, left elbow: Secondary | ICD-10-CM | POA: Diagnosis not present

## 2020-12-26 DIAGNOSIS — I119 Hypertensive heart disease without heart failure: Secondary | ICD-10-CM | POA: Diagnosis not present

## 2020-12-26 DIAGNOSIS — I2510 Atherosclerotic heart disease of native coronary artery without angina pectoris: Secondary | ICD-10-CM | POA: Diagnosis not present

## 2020-12-26 DIAGNOSIS — N1831 Chronic kidney disease, stage 3a: Secondary | ICD-10-CM | POA: Diagnosis not present

## 2020-12-26 DIAGNOSIS — E785 Hyperlipidemia, unspecified: Secondary | ICD-10-CM | POA: Diagnosis not present

## 2021-01-23 DIAGNOSIS — I119 Hypertensive heart disease without heart failure: Secondary | ICD-10-CM | POA: Diagnosis not present

## 2021-01-23 DIAGNOSIS — J449 Chronic obstructive pulmonary disease, unspecified: Secondary | ICD-10-CM | POA: Diagnosis not present

## 2021-01-23 DIAGNOSIS — R5381 Other malaise: Secondary | ICD-10-CM | POA: Diagnosis not present

## 2021-01-23 DIAGNOSIS — D649 Anemia, unspecified: Secondary | ICD-10-CM | POA: Diagnosis not present

## 2021-02-18 DIAGNOSIS — R059 Cough, unspecified: Secondary | ICD-10-CM | POA: Diagnosis not present

## 2021-02-21 DIAGNOSIS — J101 Influenza due to other identified influenza virus with other respiratory manifestations: Secondary | ICD-10-CM | POA: Diagnosis not present

## 2021-02-24 DIAGNOSIS — E785 Hyperlipidemia, unspecified: Secondary | ICD-10-CM | POA: Diagnosis not present

## 2021-02-24 DIAGNOSIS — I69354 Hemiplegia and hemiparesis following cerebral infarction affecting left non-dominant side: Secondary | ICD-10-CM | POA: Diagnosis not present

## 2021-02-24 DIAGNOSIS — I5032 Chronic diastolic (congestive) heart failure: Secondary | ICD-10-CM | POA: Diagnosis not present

## 2021-02-24 DIAGNOSIS — M2550 Pain in unspecified joint: Secondary | ICD-10-CM | POA: Diagnosis not present

## 2021-03-27 DIAGNOSIS — J9611 Chronic respiratory failure with hypoxia: Secondary | ICD-10-CM | POA: Diagnosis not present

## 2021-03-27 DIAGNOSIS — I2510 Atherosclerotic heart disease of native coronary artery without angina pectoris: Secondary | ICD-10-CM | POA: Diagnosis not present

## 2021-03-27 DIAGNOSIS — M810 Age-related osteoporosis without current pathological fracture: Secondary | ICD-10-CM | POA: Diagnosis not present

## 2021-03-27 DIAGNOSIS — R1310 Dysphagia, unspecified: Secondary | ICD-10-CM | POA: Diagnosis not present

## 2021-04-21 DIAGNOSIS — Z79899 Other long term (current) drug therapy: Secondary | ICD-10-CM | POA: Diagnosis not present

## 2021-04-21 DIAGNOSIS — I5022 Chronic systolic (congestive) heart failure: Secondary | ICD-10-CM | POA: Diagnosis not present

## 2021-04-21 DIAGNOSIS — I10 Essential (primary) hypertension: Secondary | ICD-10-CM | POA: Diagnosis not present

## 2021-04-21 DIAGNOSIS — E785 Hyperlipidemia, unspecified: Secondary | ICD-10-CM | POA: Diagnosis not present

## 2021-04-21 DIAGNOSIS — I259 Chronic ischemic heart disease, unspecified: Secondary | ICD-10-CM | POA: Diagnosis not present

## 2021-04-21 DIAGNOSIS — E559 Vitamin D deficiency, unspecified: Secondary | ICD-10-CM | POA: Diagnosis not present

## 2021-04-22 DIAGNOSIS — E559 Vitamin D deficiency, unspecified: Secondary | ICD-10-CM | POA: Diagnosis not present

## 2021-04-26 DIAGNOSIS — F0390 Unspecified dementia without behavioral disturbance: Secondary | ICD-10-CM | POA: Diagnosis not present

## 2021-04-26 DIAGNOSIS — N1831 Chronic kidney disease, stage 3a: Secondary | ICD-10-CM | POA: Diagnosis not present

## 2021-04-26 DIAGNOSIS — R1310 Dysphagia, unspecified: Secondary | ICD-10-CM | POA: Diagnosis not present

## 2021-04-26 DIAGNOSIS — E785 Hyperlipidemia, unspecified: Secondary | ICD-10-CM | POA: Diagnosis not present

## 2021-05-26 DIAGNOSIS — D649 Anemia, unspecified: Secondary | ICD-10-CM | POA: Diagnosis not present

## 2021-05-26 DIAGNOSIS — M2550 Pain in unspecified joint: Secondary | ICD-10-CM | POA: Diagnosis not present

## 2021-05-26 DIAGNOSIS — I119 Hypertensive heart disease without heart failure: Secondary | ICD-10-CM | POA: Diagnosis not present

## 2021-05-26 DIAGNOSIS — J449 Chronic obstructive pulmonary disease, unspecified: Secondary | ICD-10-CM | POA: Diagnosis not present

## 2021-06-26 DIAGNOSIS — I5032 Chronic diastolic (congestive) heart failure: Secondary | ICD-10-CM | POA: Diagnosis not present

## 2021-06-26 DIAGNOSIS — E785 Hyperlipidemia, unspecified: Secondary | ICD-10-CM | POA: Diagnosis not present

## 2021-06-26 DIAGNOSIS — R5381 Other malaise: Secondary | ICD-10-CM | POA: Diagnosis not present

## 2021-06-26 DIAGNOSIS — I69354 Hemiplegia and hemiparesis following cerebral infarction affecting left non-dominant side: Secondary | ICD-10-CM | POA: Diagnosis not present

## 2021-07-27 DIAGNOSIS — R5381 Other malaise: Secondary | ICD-10-CM | POA: Diagnosis not present

## 2021-07-27 DIAGNOSIS — R1310 Dysphagia, unspecified: Secondary | ICD-10-CM | POA: Diagnosis not present

## 2021-07-27 DIAGNOSIS — D649 Anemia, unspecified: Secondary | ICD-10-CM | POA: Diagnosis not present

## 2021-07-27 DIAGNOSIS — J449 Chronic obstructive pulmonary disease, unspecified: Secondary | ICD-10-CM | POA: Diagnosis not present

## 2021-08-26 DIAGNOSIS — I5032 Chronic diastolic (congestive) heart failure: Secondary | ICD-10-CM | POA: Diagnosis not present

## 2021-08-26 DIAGNOSIS — E785 Hyperlipidemia, unspecified: Secondary | ICD-10-CM | POA: Diagnosis not present

## 2021-08-26 DIAGNOSIS — F0390 Unspecified dementia without behavioral disturbance: Secondary | ICD-10-CM | POA: Diagnosis not present

## 2021-08-26 DIAGNOSIS — I69354 Hemiplegia and hemiparesis following cerebral infarction affecting left non-dominant side: Secondary | ICD-10-CM | POA: Diagnosis not present

## 2021-09-25 DIAGNOSIS — J9611 Chronic respiratory failure with hypoxia: Secondary | ICD-10-CM | POA: Diagnosis not present

## 2021-09-25 DIAGNOSIS — N1831 Chronic kidney disease, stage 3a: Secondary | ICD-10-CM | POA: Diagnosis not present

## 2021-09-25 DIAGNOSIS — I2510 Atherosclerotic heart disease of native coronary artery without angina pectoris: Secondary | ICD-10-CM | POA: Diagnosis not present

## 2021-09-25 DIAGNOSIS — M2550 Pain in unspecified joint: Secondary | ICD-10-CM | POA: Diagnosis not present

## 2021-10-21 DIAGNOSIS — I10 Essential (primary) hypertension: Secondary | ICD-10-CM | POA: Diagnosis not present

## 2021-10-21 DIAGNOSIS — Z79899 Other long term (current) drug therapy: Secondary | ICD-10-CM | POA: Diagnosis not present

## 2021-10-21 DIAGNOSIS — E559 Vitamin D deficiency, unspecified: Secondary | ICD-10-CM | POA: Diagnosis not present

## 2021-10-21 DIAGNOSIS — D649 Anemia, unspecified: Secondary | ICD-10-CM | POA: Diagnosis not present

## 2021-10-23 DIAGNOSIS — E559 Vitamin D deficiency, unspecified: Secondary | ICD-10-CM | POA: Diagnosis not present

## 2023-10-12 ENCOUNTER — Emergency Department (HOSPITAL_COMMUNITY): Payer: Medicare Other

## 2023-10-12 ENCOUNTER — Other Ambulatory Visit: Payer: Self-pay

## 2023-10-12 ENCOUNTER — Encounter (HOSPITAL_COMMUNITY): Payer: Self-pay

## 2023-10-12 ENCOUNTER — Inpatient Hospital Stay (HOSPITAL_COMMUNITY)
Admission: EM | Admit: 2023-10-12 | Discharge: 2023-10-16 | DRG: 871 | Disposition: A | Discharge status: Skilled Nursing Facility | Payer: Medicare Other | Source: Skilled Nursing Facility | Attending: Family Medicine | Admitting: Family Medicine

## 2023-10-12 DIAGNOSIS — F03911 Unspecified dementia, unspecified severity, with agitation: Secondary | ICD-10-CM | POA: Diagnosis not present

## 2023-10-12 DIAGNOSIS — E875 Hyperkalemia: Secondary | ICD-10-CM | POA: Diagnosis present

## 2023-10-12 DIAGNOSIS — Z8249 Family history of ischemic heart disease and other diseases of the circulatory system: Secondary | ICD-10-CM

## 2023-10-12 DIAGNOSIS — I110 Hypertensive heart disease with heart failure: Secondary | ICD-10-CM | POA: Diagnosis present

## 2023-10-12 DIAGNOSIS — I5042 Chronic combined systolic (congestive) and diastolic (congestive) heart failure: Secondary | ICD-10-CM | POA: Diagnosis present

## 2023-10-12 DIAGNOSIS — R0600 Dyspnea, unspecified: Secondary | ICD-10-CM | POA: Diagnosis not present

## 2023-10-12 DIAGNOSIS — N179 Acute kidney failure, unspecified: Secondary | ICD-10-CM | POA: Diagnosis present

## 2023-10-12 DIAGNOSIS — Z1611 Resistance to penicillins: Secondary | ICD-10-CM | POA: Diagnosis present

## 2023-10-12 DIAGNOSIS — I2510 Atherosclerotic heart disease of native coronary artery without angina pectoris: Secondary | ICD-10-CM | POA: Diagnosis present

## 2023-10-12 DIAGNOSIS — I69254 Hemiplegia and hemiparesis following other nontraumatic intracranial hemorrhage affecting left non-dominant side: Secondary | ICD-10-CM

## 2023-10-12 DIAGNOSIS — E785 Hyperlipidemia, unspecified: Secondary | ICD-10-CM | POA: Diagnosis present

## 2023-10-12 DIAGNOSIS — A4159 Other Gram-negative sepsis: Principal | ICD-10-CM | POA: Diagnosis present

## 2023-10-12 DIAGNOSIS — Z87891 Personal history of nicotine dependence: Secondary | ICD-10-CM

## 2023-10-12 DIAGNOSIS — E1165 Type 2 diabetes mellitus with hyperglycemia: Secondary | ICD-10-CM | POA: Diagnosis present

## 2023-10-12 DIAGNOSIS — Z953 Presence of xenogenic heart valve: Secondary | ICD-10-CM

## 2023-10-12 DIAGNOSIS — J690 Pneumonitis due to inhalation of food and vomit: Secondary | ICD-10-CM | POA: Diagnosis present

## 2023-10-12 DIAGNOSIS — J440 Chronic obstructive pulmonary disease with acute lower respiratory infection: Secondary | ICD-10-CM | POA: Diagnosis present

## 2023-10-12 DIAGNOSIS — Z955 Presence of coronary angioplasty implant and graft: Secondary | ICD-10-CM

## 2023-10-12 DIAGNOSIS — J9601 Acute respiratory failure with hypoxia: Secondary | ICD-10-CM | POA: Diagnosis present

## 2023-10-12 DIAGNOSIS — J189 Pneumonia, unspecified organism: Secondary | ICD-10-CM | POA: Diagnosis present

## 2023-10-12 DIAGNOSIS — L89321 Pressure ulcer of left buttock, stage 1: Secondary | ICD-10-CM | POA: Diagnosis present

## 2023-10-12 DIAGNOSIS — Z20822 Contact with and (suspected) exposure to covid-19: Secondary | ICD-10-CM | POA: Diagnosis present

## 2023-10-12 DIAGNOSIS — Z79899 Other long term (current) drug therapy: Secondary | ICD-10-CM

## 2023-10-12 DIAGNOSIS — E872 Acidosis, unspecified: Secondary | ICD-10-CM | POA: Diagnosis present

## 2023-10-12 DIAGNOSIS — J441 Chronic obstructive pulmonary disease with (acute) exacerbation: Secondary | ICD-10-CM | POA: Diagnosis present

## 2023-10-12 LAB — I-STAT CHEM 8, ED
BUN: 23 mg/dL (ref 8–23)
Calcium, Ion: 1.12 mmol/L — ABNORMAL LOW (ref 1.15–1.40)
Chloride: 104 mmol/L (ref 98–111)
Creatinine, Ser: 1.6 mg/dL — ABNORMAL HIGH (ref 0.61–1.24)
Glucose, Bld: 197 mg/dL — ABNORMAL HIGH (ref 70–99)
HCT: 39 % (ref 39.0–52.0)
Hemoglobin: 13.3 g/dL (ref 13.0–17.0)
Potassium: 4.4 mmol/L (ref 3.5–5.1)
Sodium: 139 mmol/L (ref 135–145)
TCO2: 23 mmol/L (ref 22–32)

## 2023-10-12 LAB — COMPREHENSIVE METABOLIC PANEL
ALT: 33 U/L (ref 0–44)
AST: 48 U/L — ABNORMAL HIGH (ref 15–41)
Albumin: 3.6 g/dL (ref 3.5–5.0)
Alkaline Phosphatase: 142 U/L — ABNORMAL HIGH (ref 38–126)
Anion gap: 11 (ref 5–15)
BUN: 21 mg/dL (ref 8–23)
CO2: 21 mmol/L — ABNORMAL LOW (ref 22–32)
Calcium: 9 mg/dL (ref 8.9–10.3)
Chloride: 105 mmol/L (ref 98–111)
Creatinine, Ser: 1.48 mg/dL — ABNORMAL HIGH (ref 0.61–1.24)
GFR, Estimated: 47 mL/min — ABNORMAL LOW (ref >60)
Glucose, Bld: 193 mg/dL — ABNORMAL HIGH (ref 70–99)
Potassium: 4.5 mmol/L (ref 3.5–5.1)
Sodium: 137 mmol/L (ref 135–145)
Total Bilirubin: 0.9 mg/dL (ref <1.2)
Total Protein: 7.2 g/dL (ref 6.5–8.1)

## 2023-10-12 LAB — I-STAT VENOUS BLOOD GAS, ED
Acid-base deficit: 2 mmol/L (ref 0.0–2.0)
Bicarbonate: 21.9 mmol/L (ref 20.0–28.0)
Calcium, Ion: 1.12 mmol/L — ABNORMAL LOW (ref 1.15–1.40)
HCT: 39 % (ref 39.0–52.0)
Hemoglobin: 13.3 g/dL (ref 13.0–17.0)
O2 Saturation: 94 %
Potassium: 4.4 mmol/L (ref 3.5–5.1)
Sodium: 139 mmol/L (ref 135–145)
TCO2: 23 mmol/L (ref 22–32)
pCO2, Ven: 33.9 mm[Hg] — ABNORMAL LOW (ref 44–60)
pH, Ven: 7.418 (ref 7.25–7.43)
pO2, Ven: 68 mm[Hg] — ABNORMAL HIGH (ref 32–45)

## 2023-10-12 LAB — I-STAT CG4 LACTIC ACID, ED
Lactic Acid, Venous: 3.3 mmol/L (ref 0.5–1.9)
Lactic Acid, Venous: 3.2 mmol/L (ref 0.5–1.9)

## 2023-10-12 LAB — CBC WITH DIFFERENTIAL/PLATELET
Abs Immature Granulocytes: 0.06 10*3/uL (ref 0.00–0.07)
Basophils Absolute: 0 10*3/uL (ref 0.0–0.1)
Basophils Relative: 0 %
Eosinophils Absolute: 0 10*3/uL (ref 0.0–0.5)
Eosinophils Relative: 0 %
HCT: 39.4 % (ref 39.0–52.0)
Hemoglobin: 13.2 g/dL (ref 13.0–17.0)
Immature Granulocytes: 1 %
Lymphocytes Relative: 5 %
Lymphs Abs: 0.6 10*3/uL — ABNORMAL LOW (ref 0.7–4.0)
MCH: 30.4 pg (ref 26.0–34.0)
MCHC: 33.5 g/dL (ref 30.0–36.0)
MCV: 90.8 fL (ref 80.0–100.0)
Monocytes Absolute: 0.3 10*3/uL (ref 0.1–1.0)
Monocytes Relative: 3 %
Neutro Abs: 11.1 10*3/uL — ABNORMAL HIGH (ref 1.7–7.7)
Neutrophils Relative %: 91 %
Platelets: 214 10*3/uL (ref 150–400)
RBC: 4.34 MIL/uL (ref 4.22–5.81)
RDW: 13.2 % (ref 11.5–15.5)
WBC: 12.1 10*3/uL — ABNORMAL HIGH (ref 4.0–10.5)
nRBC: 0 % (ref 0.0–0.2)

## 2023-10-12 LAB — RESP PANEL BY RT-PCR (RSV, FLU A&B, COVID)  RVPGX2
Influenza A by PCR: NEGATIVE
Influenza B by PCR: NEGATIVE
Resp Syncytial Virus by PCR: NEGATIVE
SARS Coronavirus 2 by RT PCR: NEGATIVE

## 2023-10-12 LAB — TROPONIN I (HIGH SENSITIVITY)
Troponin I (High Sensitivity): 16 ng/L (ref <18)
Troponin I (High Sensitivity): 22 ng/L — ABNORMAL HIGH (ref <18)

## 2023-10-12 LAB — BRAIN NATRIURETIC PEPTIDE: B Natriuretic Peptide: 119.6 pg/mL — ABNORMAL HIGH (ref 0.0–100.0)

## 2023-10-12 MED ORDER — ENOXAPARIN SODIUM 40 MG/0.4ML IJ SOSY
40.0000 mg | PREFILLED_SYRINGE | INTRAMUSCULAR | Status: DC
  Administered 2023-10-12: 40 mg via SUBCUTANEOUS
  Filled 2023-10-12: qty 0.4

## 2023-10-12 MED ORDER — INSULIN ASPART 100 UNIT/ML IJ SOLN
0.0000 [IU] | INTRAMUSCULAR | Status: DC
  Administered 2023-10-13: 1 [IU] via SUBCUTANEOUS
  Administered 2023-10-13: 2 [IU] via SUBCUTANEOUS
  Administered 2023-10-13: 1 [IU] via SUBCUTANEOUS
  Administered 2023-10-13: 3 [IU] via SUBCUTANEOUS

## 2023-10-12 MED ORDER — SODIUM CHLORIDE 0.9 % IV SOLN
2.0000 g | Freq: Once | INTRAVENOUS | Status: AC
  Administered 2023-10-12: 2 g via INTRAVENOUS
  Filled 2023-10-12: qty 12.5

## 2023-10-12 MED ORDER — SODIUM CHLORIDE 0.9 % IV SOLN: Freq: Once | INTRAVENOUS | Status: AC

## 2023-10-12 MED ORDER — SODIUM CHLORIDE 0.9 % IV BOLUS
1000.0000 mL | Freq: Once | INTRAVENOUS | Status: AC
  Administered 2023-10-12: 1000 mL via INTRAVENOUS

## 2023-10-12 MED ORDER — SODIUM CHLORIDE 0.9 % IV SOLN: INTRAVENOUS | Status: DC

## 2023-10-12 MED ORDER — SODIUM CHLORIDE 0.9 % IV SOLN: 2.0000 g | INTRAVENOUS | Status: DC

## 2023-10-12 MED ORDER — SODIUM CHLORIDE 0.9 % IV SOLN
2.0000 g | INTRAVENOUS | Status: DC
  Administered 2023-10-12: 2 g via INTRAVENOUS
  Filled 2023-10-12: qty 12.5

## 2023-10-12 NOTE — ED Triage Notes (Signed)
PT was BIB [facility] from [facility] with a c/o of SOB and possible aspiration. PT was eating dinner, choked a little and then vomited per staff.Pt was issued a nebulizer tx at the facilty and one by EMS.EMS also issued the PT 125 of solu-medrol. PT is on 2l of O2 and stats are are 95% currently. PT does not regularly wear O2.

## 2023-10-12 NOTE — Progress Notes (Signed)
ED Pharmacy Antibiotic Sign Off An antibiotic consult was received from an ED provider for Cefepime per pharmacy dosing for aspiration pneumonia. A chart review was completed to assess appropriateness.   The following one time order(s) were placed:  Cefepime 2g IV x1  Further antibiotic and/or antibiotic pharmacy consults should be ordered by the admitting provider if indicated.   Thank you for allowing pharmacy to be a part of this patient's care.   Wilburn Cornelia, PharmD, BCPS Clinical Pharmacist 10/12/2023 9:55 PM   Please refer to [facility] for pharmacy phone number

## 2023-10-12 NOTE — ED Provider Notes (Signed)
[facility]ERGENCY DE[facility]MENT AT [facility] Provider Note   CSN: [id number] Arrival date & time: 10/12/23  1953     History {Add pertinent medical, surgical, social history, OB history to HPI:1} No chief complaint on file.   Edward Strickland is a 82 y.o. male.  83 year old male with prior medical history as detailed below presents for evaluation.  Patient arrives with EMS transport from [facility].  Patient with apparent vomiting x 1 during dinner.  Patient was noted to have increased respiratory difficulty after this.  EMS noted diffuse bilateral wheezing.  Patient was given breathing treatments and Solu-Medrol prior to arrival.  Patient reports that he feels improved.  Staff at nursing home were concerned about possible aspiration.  Prior medical history includes COPD, hypertension, CAD.     The history is provided by the patient and medical records.       Home Medications Prior to Admission medications   Medication Sig Start Date End Date Taking? Authorizing Provider  atorvastatin (LIPITOR) 40 MG tablet Take 1 tablet (40 mg total) by mouth daily at 6 PM. 12/16/18   Layne Benton, NP  carvedilol (COREG) 3.125 MG tablet Take 3.125 mg by mouth 2 (two) times daily with a meal. 08/01/18   [provider]  ENTRESTO 24-26 MG Take 1 tablet by mouth 2 (two) times daily. 07/01/18   [provider]  furosemide (LASIX) 40 MG tablet Take 1 tablet (40 mg total) by mouth daily. 07/09/18   Janetta Hora, PA-C  Maltodextrin-Xanthan Gum (RESOURCE THICKENUP CLEAR) POWD Take 120 g by mouth as needed (For honey thick liquid diet). 12/16/18   Layne Benton, NP  Multiple Vitamin (MULTIVITAMIN WITH MINERALS) TABS tablet Take 1 tablet by mouth daily.    [provider]  triamcinolone cream (KENALOG) 0.5 % Apply 1 application topically 2 (two) times daily as needed (rash on legs).  10/16/18   [provider]      Allergies    Patient has no  known allergies.    Review of Systems   Review of Systems  All other systems reviewed and are negative.   Physical Exam Updated Vital Signs BP 121/68 (BP Location: Left Arm)   Pulse (!) 136   Temp 98.7 F (37.1 C) (Oral)   Resp (!) 21   SpO2 93%  Physical Exam Vitals and nursing note reviewed.  Constitutional:      General: He is not in acute distress.    Appearance: Normal appearance. He is well-developed.  HENT:     Head: Normocephalic and atraumatic.  Eyes:     Conjunctiva/sclera: Conjunctivae normal.     Pupils: Pupils are equal, round, and reactive to light.  Cardiovascular:     Rate and Rhythm: Regular rhythm. Tachycardia present.     Heart sounds: Normal heart sounds.  Pulmonary:     Effort: Pulmonary effort is normal. No respiratory distress.     Comments: Decreased breath sounds at bilateral bases. Abdominal:     General: There is no distension.     Palpations: Abdomen is soft.     Tenderness: There is no abdominal tenderness.  Musculoskeletal:        General: No deformity. Normal range of motion.     Cervical back: Normal range of motion and neck supple.  Skin:    General: Skin is warm and dry.  Neurological:     General: No focal deficit present.     Mental Status: He is  alert and oriented to person, place, and time.     ED Results / Procedures / Treatments   Labs (all labs ordered are listed, but only abnormal results are displayed) Labs Reviewed  RESP PANEL BY RT-PCR (RSV, FLU A&B, COVID)  [id number]  CULTURE, BLOOD (ROUTINE X 2)  CULTURE, BLOOD (ROUTINE X 2)  CBC WITH DIFFERENTIAL/PLATELET  BRAIN NATRIURETIC PEPTIDE  COMPREHENSIVE METABOLIC PANEL  I-STAT VENOUS BLOOD GAS, ED  I-STAT CHEM 8, ED  I-STAT CG4 LACTIC ACID, ED  TROPONIN I (HIGH SENSITIVITY)    EKG None  Radiology No results found.  Procedures Procedures  {Document cardiac monitor, telemetry assessment procedure when appropriate:1}  Medications Ordered in ED Medications - No  data to display  ED Course/ Medical Decision Making/ A&P   {   Click here for ABCD2, HEART and other calculatorsREFRESH Note before signing :1}                              Medical Decision Making Amount and/or Complexity of Data Reviewed Labs: ordered. Radiology: ordered.    Medical Screen Complete  This patient presented to the ED with complaint of ***.  This complaint involves an extensive number of treatment options. The initial differential diagnosis includes, but is not limited to, ***  This presentation is: {IllnessRisk:19196::"***","Acute","Chronic","Self-Limited","Previously Undiagnosed","Uncertain Prognosis","Complicated","Systemic Symptoms","Threat to Life/Bodily Function"}    Co morbidities that complicated the patient's evaluation  ***   Additional history obtained:  Additional history obtained from {History source:19196::"EMS","Spouse","Family","Friend","Caregiver"} External records from outside sources obtained and reviewed including prior ED visits and prior Inpatient records.    Lab Tests:  I ordered and personally interpreted labs.  The pertinent results include:  ***   Imaging Studies ordered:  I ordered imaging studies including ***  I independently visualized and interpreted obtained imaging which showed *** I agree with the radiologist interpretation.   Cardiac Monitoring:  The patient was maintained on a cardiac monitor.  I personally viewed and interpreted the cardiac monitor which showed an underlying rhythm of: ***   Medicines ordered:  I ordered medication including ***  for ***  Reevaluation of the patient after these medicines showed that the patient: {resolved/improved/worsened:23923::"improved"}    Test Considered:  ***   Critical Interventions:  ***   Consultations Obtained:  I consulted ***,  and discussed lab and imaging findings as well as pertinent plan of care.    Problem List / ED  Course:  ***   Reevaluation:  After the interventions noted above, I reevaluated the patient and found that they have: {resolved/improved/worsened:23923::"improved"}   Social Determinants of Health:  ***   Disposition:  After consideration of the diagnostic results and the patients response to treatment, I feel that the patent would benefit from ***.    {Document critical care time when appropriate:1} {Document review of labs and clinical decision tools ie heart score, Chads2Vasc2 etc:1}  {Document your independent review of radiology images, and any outside records:1} {Document your discussion with family members, caretakers, and with consultants:1} {Document social determinants of health affecting pt's care:1} {Document your decision making why or why not admission, treatments were needed:1} Final Clinical Impression(s) / ED Diagnoses Final diagnoses:  None    Rx / DC Orders ED Discharge Orders     None

## 2023-10-12 NOTE — ED Notes (Signed)
ED TO INPATIENT HANDOFF REPORT  ED Nurse Name and Phone #: Rodney Booze [phone]  S Name/Age/Gender Edward Strickland 82 y.o. male Room/Bed: 029C/029C  Code Status   Code Status: Full Code  Home/SNF/Other Nursing Home Patient oriented to: self, place, time, and situation Is this baseline? Yes   Triage Complete: Triage complete  Chief Complaint Dyspnea [R06.00]  Triage Note PT was BIB [facility] from [facility] with a c/o of SOB and possible aspiration. PT was eating dinner, choked a little and then vomited per staff.Pt was issued a nebulizer tx at the facilty and one by EMS.EMS also issued the PT 125 of solu-medrol. PT is on 2l of O2 and stats are are 95% currently. PT does not regularly wear O2.   Allergies No Known Allergies  Level of Care/Admitting Diagnosis ED Disposition     ED Disposition  Admit   Condition  --   Comment  Hospital Area: [facility] [[id number]]  Level of Care: Telemetry Medical [104]  May place patient in observation at [facility] or [facility] if equivalent level of care is available:: Yes  Covid Evaluation: Asymptomatic - no recent exposure (last 10 days) testing not required  Diagnosis: Dyspnea [241871]  Admitting Physician: Darlin Drop [[id number]]  Attending Physician: Darlin Drop [[id number]]          B Medical/Surgery History Past Medical History:  Diagnosis Date   Aortic stenosis, severe    CAD (coronary artery disease)    a. 06/2017: s/p DES to LAD.    Chronic systolic CHF (congestive heart failure) (HCC)    COPD (chronic obstructive pulmonary disease) (HCC)    Hypertension    S/P TAVR (transcatheter aortic valve replacement) 07/23/2017   29 mm Edwards Sapien 3 transcatheter heart valve placed via percutaneous right transfemoral approach   Past Surgical History:  Procedure Laterality Date   CHEST TUBE INSERTION Right 07/23/2017   Procedure: INSERTION PLEURAL DRAINAGE CATHETER;  Surgeon: Purcell Nails, MD;   Location: [facility] OR;  Service: Thoracic;  Laterality: Right;   CORONARY STENT INTERVENTION N/A 07/17/2017   Procedure: CORONARY STENT INTERVENTION;  Surgeon: Kathleene Hazel, MD;  Location: [facility] INVASIVE CV LAB;  Service: Cardiovascular;  Laterality: N/A;   IR THORACENTESIS ASP PLEURAL SPACE W/IMG GUIDE  07/11/2017   MULTIPLE EXTRACTIONS WITH ALVEOLOPLASTY N/A 07/15/2017   Procedure: Extraction of tooth #'s 4,6,7,8,9,15, 20,21,22, 23 ,24,26,and 27 with alveoloplassty;  Surgeon: Charlynne Pander, DDS;  Location: [facility] OR;  Service: Oral Surgery;  Laterality: N/A;   REMOVAL OF PLEURAL DRAINAGE CATHETER N/A 08/07/2017   Procedure: REMOVAL OF PLEURAL DRAINAGE CATHETER;  Surgeon: Purcell Nails, MD;  Location: [facility] OR;  Service: Thoracic;  Laterality: N/A;   TEE WITHOUT CARDIOVERSION N/A 07/23/2017   Procedure: TRANSESOPHAGEAL ECHOCARDIOGRAM (TEE);  Surgeon: Kathleene Hazel, MD;  Location: [facility] OR;  Service: Open Heart Surgery;  Laterality: N/A;   TRANSCATHETER AORTIC VALVE REPLACEMENT, TRANSFEMORAL N/A 07/23/2017   Procedure: TRANSCATHETER AORTIC VALVE REPLACEMENT, TRANSFEMORAL;  Surgeon: Kathleene Hazel, MD;  Location: [facility] OR;  Service: Open Heart Surgery;  Laterality: N/A;     A IV Location/Drains/Wounds Patient Lines/Drains/Airways Status     Active Line/Drains/Airways     Name Placement date Placement time Site Days   Peripheral IV 10/12/23 20 G Anterior;Distal;Right;Upper Arm 10/12/23  2000  Arm  less than 1   External Urinary Catheter 12/12/18  2200  --  1765   Incision (Closed) 07/15/17 Lip Other (Comment) 07/15/17  1205  --  2280   Incision (Closed) 07/23/17 Groin Left 07/23/17  0945  -- 2272   Incision (Closed) 07/23/17 Groin Right 07/23/17  0945  -- 2272   Incision (Closed) 07/23/17 Chest Right 07/23/17  0945  -- 2272            Intake/Output Last 24 hours  Intake/Output Summary (Last 24 hours) at 10/12/2023 2345 Last data filed at 10/12/2023 2258 Gross per 24 hour   Intake 1096.48 ml  Output --  Net 1096.48 ml    Labs/Imaging Results for orders placed or performed during the hospital encounter of 10/12/23 (from the past 48 hours)  CBC with Differential     Status: Abnormal   Collection Time: 10/12/23  8:55 PM  Result Value Ref Range   WBC 12.1 (H) 4.0 - 10.5 K/uL   RBC 4.34 4.22 - 5.81 MIL/uL   Hemoglobin 13.2 13.0 - 17.0 g/dL   HCT 52.8 41.3 - 24.4 %   MCV 90.8 80.0 - 100.0 fL   MCH 30.4 26.0 - 34.0 pg   MCHC 33.5 30.0 - 36.0 g/dL   RDW 01.0 27.2 - 53.6 %   Platelets 214 150 - 400 K/uL   nRBC 0.0 0.0 - 0.2 %   Neutrophils Relative % 91 %   Neutro Abs 11.1 (H) 1.7 - 7.7 K/uL   Lymphocytes Relative 5 %   Lymphs Abs 0.6 (L) 0.7 - 4.0 K/uL   Monocytes Relative 3 %   Monocytes Absolute 0.3 0.1 - 1.0 K/uL   Eosinophils Relative 0 %   Eosinophils Absolute 0.0 0.0 - 0.5 K/uL   Basophils Relative 0 %   Basophils Absolute 0.0 0.0 - 0.1 K/uL   Immature Granulocytes 1 %   Abs Immature Granulocytes 0.06 0.00 - 0.07 K/uL    Comment: Performed at [facility], [address], [place], [place] [address]  Troponin I (High Sensitivity)     Status: None   Collection Time: 10/12/23  8:55 PM  Result Value Ref Range   Troponin I (High Sensitivity) 16 <18 ng/L    Comment: (NOTE) Elevated high sensitivity troponin I (hsTnI) values and significant  changes across serial measurements may suggest ACS but many other  chronic and acute conditions are known to elevate hsTnI results.  Refer to the "Links" section for chest pain algorithms and additional  guidance. Performed at [facility], [address] [place], [place] [address]   Brain natriuretic peptide     Status: Abnormal   Collection Time: 10/12/23  8:55 PM  Result Value Ref Range   B Natriuretic Peptide 119.6 (H) 0.0 - 100.0 pg/mL    Comment: Performed at [facility], [address] [place], [place] [address]  Comprehensive metabolic panel     Status: Abnormal   Collection  Time: 10/12/23  8:55 PM  Result Value Ref Range   Sodium 137 135 - 145 mmol/L   Potassium 4.5 3.5 - 5.1 mmol/L   Chloride 105 98 - 111 mmol/L   CO2 21 (L) 22 - 32 mmol/L   Glucose, Bld 193 (H) 70 - 99 mg/dL    Comment: Glucose reference range applies only to samples taken after fasting for at least 8 hours.   BUN 21 8 - 23 mg/dL   Creatinine, Ser 7.56 (H) 0.61 - 1.24 mg/dL   Calcium 9.0 8.9 - 43.3 mg/dL   Total Protein 7.2 6.5 - 8.1 g/dL   Albumin 3.6 3.5 - 5.0 g/dL  AST 48 (H) 15 - 41 U/L   ALT 33 0 - 44 U/L   Alkaline Phosphatase 142 (H) 38 - 126 U/L   Total Bilirubin 0.9 <1.2 mg/dL   GFR, Estimated 47 (L) >60 mL/min    Comment: (NOTE) Calculated using the CKD-EPI Creatinine Equation (2021)    Anion gap 11 5 - 15    Comment: Performed at [facility], [address], [place], [place] [address]  I-Stat venous blood gas, ED     Status: Abnormal   Collection Time: 10/12/23  9:02 PM  Result Value Ref Range   pH, Ven 7.418 7.25 - 7.43   pCO2, Ven 33.9 (L) 44 - 60 mmHg   pO2, Ven 68 (H) 32 - 45 mmHg   Bicarbonate 21.9 20.0 - 28.0 mmol/L   TCO2 23 22 - 32 mmol/L   O2 Saturation 94 %   Acid-base deficit 2.0 0.0 - 2.0 mmol/L   Sodium 139 135 - 145 mmol/L   Potassium 4.4 3.5 - 5.1 mmol/L   Calcium, Ion 1.12 (L) 1.15 - 1.40 mmol/L   HCT 39.0 39.0 - 52.0 %   Hemoglobin 13.3 13.0 - 17.0 g/dL   Sample type VENOUS   I-stat chem 8, ED     Status: Abnormal   Collection Time: 10/12/23  9:02 PM  Result Value Ref Range   Sodium 139 135 - 145 mmol/L   Potassium 4.4 3.5 - 5.1 mmol/L   Chloride 104 98 - 111 mmol/L   BUN 23 8 - 23 mg/dL   Creatinine, Ser 4.27 (H) 0.61 - 1.24 mg/dL   Glucose, Bld 062 (H) 70 - 99 mg/dL    Comment: Glucose reference range applies only to samples taken after fasting for at least 8 hours.   Calcium, Ion 1.12 (L) 1.15 - 1.40 mmol/L   TCO2 23 22 - 32 mmol/L   Hemoglobin 13.3 13.0 - 17.0 g/dL   HCT 37.6 28.3 - 15.1 %  I-Stat Lactic Acid     Status:  Abnormal   Collection Time: 10/12/23  9:03 PM  Result Value Ref Range   Lactic Acid, Venous 3.3 (HH) 0.5 - 1.9 mmol/L   Comment NOTIFIED PHYSICIAN   Resp panel by RT-PCR (RSV, Flu A&B, Covid) Anterior Nasal Swab     Status: None   Collection Time: 10/12/23  9:10 PM   Specimen: Anterior Nasal Swab  Result Value Ref Range   SARS Coronavirus 2 by RT PCR NEGATIVE NEGATIVE   Influenza A by PCR NEGATIVE NEGATIVE   Influenza B by PCR NEGATIVE NEGATIVE    Comment: (NOTE) The Xpert Xpress SARS-CoV-2/FLU/RSV plus assay is intended as an aid in the diagnosis of influenza from Nasopharyngeal swab specimens and should not be used as a sole basis for treatment. Nasal washings and aspirates are unacceptable for Xpert Xpress SARS-CoV-2/FLU/RSV testing.  Fact Sheet for Patients: BloggerCourse.com  Fact Sheet for Healthcare Providers: SeriousBroker.it  This test is not yet approved or cleared by the [place] FDA and has been authorized for detection and/or diagnosis of SARS-CoV-2 by FDA under an Emergency Use Authorization (EUA). This EUA will remain in effect (meaning this test can be used) for the duration of the COVID-19 declaration under Section 564(b)(1) of the Act, 21 U.S.C. section 360bbb-3(b)(1), unless the authorization is terminated or revoked.     Resp Syncytial Virus by PCR NEGATIVE NEGATIVE    Comment: (NOTE) Fact Sheet for Patients: BloggerCourse.com  Fact Sheet for Healthcare Providers: SeriousBroker.it  This test is not yet approved or cleared by the [place] and has been authorized for detection and/or diagnosis of SARS-CoV-2 by FDA under an Emergency Use Authorization (EUA). This EUA will remain in effect (meaning this test can be used) for the duration of the COVID-19 declaration under Section 564(b)(1) of the Act, 21 U.S.C. section 360bbb-3(b)(1), unless the  authorization is terminated or revoked.  Performed at [facility], [address] [place], [place] [address]   Troponin I (High Sensitivity)     Status: Abnormal   Collection Time: 10/12/23 10:01 PM  Result Value Ref Range   Troponin I (High Sensitivity) 22 (H) <18 ng/L    Comment: (NOTE) Elevated high sensitivity troponin I (hsTnI) values and significant  changes across serial measurements may suggest ACS but many other  chronic and acute conditions are known to elevate hsTnI results.  Refer to the "Links" section for chest pain algorithms and additional  guidance. Performed at [facility], [address], [place], [place] [address]   I-Stat Lactic Acid     Status: Abnormal   Collection Time: 10/12/23 10:41 PM  Result Value Ref Range   Lactic Acid, Venous 3.2 (HH) 0.5 - 1.9 mmol/L   Comment NOTIFIED PHYSICIAN    DG Chest Port 1 View Result Date: 10/12/2023 CLINICAL DATA:  Shortness of breath EXAM: PORTABLE CHEST 1 VIEW COMPARISON:  08/07/2017 FINDINGS: Multiple monitoring leads overlie the lower chest. Heart size appears within normal limits. Prior TAVR. Aortic atherosclerosis. No focal airspace consolidation, pleural effusion, or pneumothorax. The visualized skeletal structures are unremarkable. IMPRESSION: No active disease. Electronically Signed   By: Duanne Guess D.O.   On: 10/12/2023 20:19    Pending Labs Unresulted Labs (From admission, onward)     Start     Ordered   10/19/23 0500  Creatinine, serum  (enoxaparin (LOVENOX)    CrCl >/= 30 ml/min)  Weekly,   R     Comments: while on enoxaparin therapy    10/12/23 2315   10/13/23 0500  CBC  Tomorrow morning,   R        10/12/23 2317   10/13/23 0500  Comprehensive metabolic panel  Tomorrow morning,   R        10/12/23 2317   10/13/23 0500  Magnesium  Tomorrow morning,   R        10/12/23 2317   10/13/23 0500  Phosphorus  Tomorrow morning,   R        10/12/23 2317   10/13/23 0500  Hemoglobin A1c   Tomorrow morning,   R       Comments: To assess prior glycemic control    10/12/23 2328   10/12/23 2001  Culture, blood (routine x 2)  BLOOD CULTURE X 2,   R (with STAT occurrences)      10/12/23 2001            Vitals/Pain Today's Vitals   10/12/23 1954 10/12/23 2123 10/12/23 2124 10/12/23 2215  BP: 121/68   92/65  Pulse: (!) 136   (!) 123  Resp: (!) 21   18  Temp: 98.7 F (37.1 C)     TempSrc: Oral     SpO2: 93%   94%  Weight:  55.4 kg    Height:  5\' 7"  (1.702 m)    PainSc:   0-No pain     Isolation Precautions No active isolations  Medications Medications  enoxaparin (LOVENOX) injection 40 mg (  has no administration in time range)  ceFEPIme (MAXIPIME) 2 g in sodium chloride 0.9 % 100 mL IVPB (has no administration in time range)  insulin aspart (novoLOG) injection 0-9 Units (has no administration in time range)  0.9 %  sodium chloride infusion (has no administration in time range)  sodium chloride 0.9 % bolus 1,000 mL (0 mLs Intravenous Stopped 10/12/23 2226)  ceFEPIme (MAXIPIME) 2 g in sodium chloride 0.9 % 100 mL IVPB (0 g Intravenous Stopped 10/12/23 2258)  0.9 %  sodium chloride infusion ( Intravenous New Bag/Given 10/12/23 2314)    Mobility non-ambulatory     Focused Assessments Cardiac Assessment Handoff:  Cardiac Rhythm: Sinus tachycardia No results found for: "CKTOTAL", "CKMB", "CKMBINDEX", "TROPONINI" No results found for: "DDIMER" Does the Patient currently have chest pain? No    R Recommendations: See Admitting Provider Note  Report given to:   Additional Notes:

## 2023-10-12 NOTE — ED Provider Notes (Incomplete)
[place] EMERGENCY DEPARTMENT AT [facility] Provider Note   CSN: [id number] Arrival date & time: 10/12/23  1953     History {Add pertinent medical, surgical, social history, OB history to HPI:1} No chief complaint on file.   Edward Strickland is a 82 y.o. male.  82 year old male with prior medical history as detailed below presents for evaluation.  Patient arrives with EMS transport from [facility].  Patient with apparent vomiting x 1 during dinner.  Patient was noted to have increased respiratory difficulty after this.  EMS noted diffuse bilateral wheezing.  Patient was given breathing treatments and Solu-Medrol prior to arrival.  Patient reports that he feels improved.  Staff at nursing home were concerned about possible aspiration.  Prior medical history includes COPD, hypertension, CAD.     The history is provided by the patient and medical records.       Home Medications Prior to Admission medications   Medication Sig Start Date End Date Taking? Authorizing Provider  atorvastatin (LIPITOR) 40 MG tablet Take 1 tablet (40 mg total) by mouth daily at 6 PM. 12/16/18   Layne Benton, NP  carvedilol (COREG) 3.125 MG tablet Take 3.125 mg by mouth 2 (two) times daily with a meal. 08/01/18   [provider]  ENTRESTO 24-26 MG Take 1 tablet by mouth 2 (two) times daily. 07/01/18   [provider]  furosemide (LASIX) 40 MG tablet Take 1 tablet (40 mg total) by mouth daily. 07/09/18   Janetta Hora, PA-C  Maltodextrin-Xanthan Gum (RESOURCE THICKENUP CLEAR) POWD Take 120 g by mouth as needed (For honey thick liquid diet). 12/16/18   Layne Benton, NP  Multiple Vitamin (MULTIVITAMIN WITH MINERALS) TABS tablet Take 1 tablet by mouth daily.    [provider]  triamcinolone cream (KENALOG) 0.5 % Apply 1 application topically 2 (two) times daily as needed (rash on legs).  10/16/18   [provider]      Allergies    Patient has no  known allergies.    Review of Systems   Review of Systems  All other systems reviewed and are negative.   Physical Exam Updated Vital Signs BP 121/68 (BP Location: Left Arm)   Pulse (!) 136   Temp 98.7 F (37.1 C) (Oral)   Resp (!) 21   SpO2 93%  Physical Exam Vitals and nursing note reviewed.  Constitutional:      General: He is not in acute distress.    Appearance: Normal appearance. He is well-developed.  HENT:     Head: Normocephalic and atraumatic.  Eyes:     Conjunctiva/sclera: Conjunctivae normal.     Pupils: Pupils are equal, round, and reactive to light.  Cardiovascular:     Rate and Rhythm: Regular rhythm. Tachycardia present.     Heart sounds: Normal heart sounds.  Pulmonary:     Effort: Pulmonary effort is normal. No respiratory distress.     Comments: Decreased breath sounds at bilateral bases. Abdominal:     General: There is no distension.     Palpations: Abdomen is soft.     Tenderness: There is no abdominal tenderness.  Musculoskeletal:        General: No deformity. Normal range of motion.     Cervical back: Normal range of motion and neck supple.  Skin:    General: Skin is warm and dry.  Neurological:     General: No focal deficit present.     Mental Status: He is  alert and oriented to person, place, and time.     ED Results / Procedures / Treatments   Labs (all labs ordered are listed, but only abnormal results are displayed) Labs Reviewed  RESP PANEL BY RT-PCR (RSV, FLU A&B, COVID)  RVPGX2  CULTURE, BLOOD (ROUTINE X 2)  CULTURE, BLOOD (ROUTINE X 2)  CBC WITH DIFFERENTIAL/PLATELET  BRAIN NATRIURETIC PEPTIDE  COMPREHENSIVE METABOLIC PANEL  I-STAT VENOUS BLOOD GAS, ED  I-STAT CHEM 8, ED  I-STAT CG4 LACTIC ACID, ED  TROPONIN I (HIGH SENSITIVITY)    EKG None  Radiology No results found.  Procedures Procedures  {Document cardiac monitor, telemetry assessment procedure when appropriate:1}  Medications Ordered in ED Medications - No  data to display  ED Course/ Medical Decision Making/ A&P   {   Click here for ABCD2, HEART and other calculatorsREFRESH Note before signing :1}                              Medical Decision Making Amount and/or Complexity of Data Reviewed Labs: ordered. Radiology: ordered.  Risk Prescription drug management. Decision regarding hospitalization.    Medical Screen Complete  This patient presented to the ED with complaint of vomiting, sob.  This complaint involves an extensive number of treatment options. The initial differential diagnosis includes, but is not limited to, possible aspiration, COPD exacerbation, metabolic abnormality, etc.   This presentation is: Acute, Chronic, Self-Limited, Previously Undiagnosed, Uncertain Prognosis, Complicated, Systemic Symptoms, and Threat to Life/Bodily Function  Patient with acute vomiting x 1 at his facility. Staff then noted SOB.  EMS noted wheezing. EMS given solumedrol, breathing treatments. Patient improved on arrival.   Patient with mildly elevated lactic acid, WBC 12.   CXR without acute abnormality.   Abx administered for possible aspiration.  Hospitalist aware of case.    Additional history obtained:  Additional history obtained from {History source:19196::"EMS","Spouse","Family","Friend","Caregiver"} External records from outside sources obtained and reviewed including prior ED visits and prior Inpatient records.    Lab Tests:  I ordered and personally interpreted labs.  The pertinent results include:  ***   Imaging Studies ordered:  I ordered imaging studies including ***  I independently visualized and interpreted obtained imaging which showed *** I agree with the radiologist interpretation.   Cardiac Monitoring:  The patient was maintained on a cardiac monitor.  I personally viewed and interpreted the cardiac monitor which showed an underlying rhythm of: ***   Medicines ordered:  I ordered medication  including ***  for ***  Reevaluation of the patient after these medicines showed that the patient: {resolved/improved/worsened:23923::"improved"}    Test Considered:  ***   Critical Interventions:  ***   Consultations Obtained:  I consulted ***,  and discussed lab and imaging findings as well as pertinent plan of care.    Problem List / ED Course:  ***   Reevaluation:  After the interventions noted above, I reevaluated the patient and found that they have: {resolved/improved/worsened:23923::"improved"}   Social Determinants of Health:  ***   Disposition:  After consideration of the diagnostic results and the patients response to treatment, I feel that the patent would benefit from ***.    {Document critical care time when appropriate:1} {Document review of labs and clinical decision tools ie heart score, Chads2Vasc2 etc:1}  {Document your independent review of radiology images, and any outside records:1} {Document your discussion with family members, caretakers, and with consultants:1} {Document social determinants of health affecting pt's  care:1} {Document your decision making why or why not admission, treatments were needed:1} Final Clinical Impression(s) / ED Diagnoses Final diagnoses:  None    Rx / DC Orders ED Discharge Orders     None

## 2023-10-12 NOTE — H&P (Addendum)
History and Physical  Beecher Viverette RUE:[id number] DOB: [birth year] DOA: 10/12/2023  Referring physician: Dr. Rodena Medin, EDP  PCP: Pcp, No  Outpatient Specialists: None Patient coming from: SNF  Chief Complaint: Choking, vomiting, and shortness of breath.   HPI: Edward Strickland is a 82 y.o. 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  EMS was activated.  Upon EMS arrival, the patient was wheezing audibly.  He received a breathing treatment and IV Solu-Medrol 125 milligram x 1 and was brought into the ED for further evaluation.  In the ED, the patient respiratory status appeared to improve.  He was placed on 2 L nasal cannula to maintain his O2 saturation above 92%.    Due to concern for aspiration and high risk for complications, EDP requested admission for further evaluation and management.  Empiric IV antibiotics cefepime were initiated.  Admitted by [facility], hospitalist service.  ED Course: Temperature 98.7.  BP 113/68, pulse 132, respiratory 22, O2 saturation 93% on 2 L.  Lab studies notable for elevated serum glucose 197, creatinine 1.60.  BNP 119.  Troponin 16, 22, lactic acid 3.3, 3.2.  WBC 12.1.  Neutrophil count 11.1.  Review of Systems: Review of systems as noted in the HPI. All other systems reviewed and are negative.   Past Medical History:  Diagnosis Date   Aortic stenosis, severe    CAD (coronary artery disease)    a. 06/2017: s/p DES to LAD.    Chronic systolic CHF (congestive heart failure) (HCC)    COPD (chronic obstructive pulmonary disease) (HCC)    Hypertension    S/P TAVR (transcatheter aortic valve replacement) 07/23/2017   29 mm Edwards Sapien 3 transcatheter heart valve placed via percutaneous right transfemoral approach   Past Surgical History:   Procedure Laterality Date   CHEST TUBE INSERTION Right 07/23/2017   Procedure: INSERTION PLEURAL DRAINAGE CATHETER;  Surgeon: Purcell Nails, MD;  Location: [facility] OR;  Service: Thoracic;  Laterality: Right;   CORONARY STENT INTERVENTION N/A 07/17/2017   Procedure: CORONARY STENT INTERVENTION;  Surgeon: Kathleene Hazel, MD;  Location: [facility] INVASIVE CV LAB;  Service: Cardiovascular;  Laterality: N/A;   IR THORACENTESIS ASP PLEURAL SPACE W/IMG GUIDE  07/11/2017   MULTIPLE EXTRACTIONS WITH ALVEOLOPLASTY N/A 07/15/2017   Procedure: Extraction of tooth #'s 4,6,7,8,9,15, 20,21,22, 23 ,24,26,and 27 with alveoloplassty;  Surgeon: Charlynne Pander, DDS;  Location: [facility] OR;  Service: Oral Surgery;  Laterality: N/A;   REMOVAL OF PLEURAL DRAINAGE CATHETER N/A 08/07/2017   Procedure: REMOVAL OF PLEURAL DRAINAGE CATHETER;  Surgeon: Purcell Nails, MD;  Location: [facility] OR;  Service: Thoracic;  Laterality: N/A;   TEE WITHOUT CARDIOVERSION N/A 07/23/2017   Procedure: TRANSESOPHAGEAL ECHOCARDIOGRAM (TEE);  Surgeon: Kathleene Hazel, MD;  Location: [facility] OR;  Service: Open Heart Surgery;  Laterality: N/A;   TRANSCATHETER AORTIC VALVE REPLACEMENT, TRANSFEMORAL N/A 07/23/2017   Procedure: TRANSCATHETER AORTIC VALVE REPLACEMENT, TRANSFEMORAL;  Surgeon: Kathleene Hazel, MD;  Location: [facility] OR;  Service: Open Heart Surgery;  Laterality: N/A;    Social History:  reports that he has quit smoking. He has never used smokeless tobacco. He reports that he does not drink alcohol and does not use drugs.   No Known Allergies  Family History  Problem Relation Age of Onset   Congestive Heart Failure  Paternal Uncle       Prior to Admission medications   Medication Sig Start Date End Date Taking? Authorizing Provider  atorvastatin (LIPITOR) 40 MG tablet Take 1 tablet (40 mg total) by mouth daily at 6 PM. 12/16/18   Layne Benton, NP  carvedilol (COREG) 3.125 MG tablet Take 3.125 mg by mouth 2 (two) times daily  with a meal. 08/01/18   [provider]  ENTRESTO 24-26 MG Take 1 tablet by mouth 2 (two) times daily. 07/01/18   [provider]  furosemide (LASIX) 40 MG tablet Take 1 tablet (40 mg total) by mouth daily. 07/09/18   Janetta Hora, PA-C  Maltodextrin-Xanthan Gum (RESOURCE THICKENUP CLEAR) POWD Take 120 g by mouth as needed (For honey thick liquid diet). 12/16/18   Layne Benton, NP  Multiple Vitamin (MULTIVITAMIN WITH MINERALS) TABS tablet Take 1 tablet by mouth daily.    [provider]  triamcinolone cream (KENALOG) 0.5 % Apply 1 application topically 2 (two) times daily as needed (rash on legs).  10/16/18   [provider]    Physical Exam: BP 92/65   Pulse (!) 123   Temp 98.7 F (37.1 C) (Oral)   Resp 18   Ht 5\' 7"  (1.702 m)   Wt 55.4 kg   SpO2 94%   BMI 19.13 kg/m   General: 82 y.o. year-old male well developed well nourished in no acute distress.  Alert and confused in the setting of dementia. Cardiovascular: Tachycardic with no rubs or gallops.  No thyromegaly or JVD noted.  No lower extremity edema bilaterally. Respiratory: Faint rales at bases with poor inspiratory effort. Abdomen: Soft nontender nondistended with normal bowel sounds x4 quadrants. Muskuloskeletal: No cyanosis, clubbing or edema noted bilaterally Neuro: CN II-XII intact, strength, sensation, reflexes Skin: No ulcerative lesions noted or rashes Psychiatry: Judgement and insight appear altered. Mood is appropriate for condition and setting          Labs on Admission:  Basic Metabolic Panel: Recent Labs  Lab 10/12/23 2055 10/12/23 2102  NA 137 139  139  K 4.5 4.4  4.4  CL 105 104  CO2 21*  --   GLUCOSE 193* 197*  BUN 21 23  CREATININE 1.48* 1.60*  CALCIUM 9.0  --    Liver Function Tests: Recent Labs  Lab 10/12/23 2055  AST 48*  ALT 33  ALKPHOS 142*  BILITOT 0.9  PROT 7.2  ALBUMIN 3.6   No results for input(s): "LIPASE", "AMYLASE" in the last 168  hours. No results for input(s): "AMMONIA" in the last 168 hours. CBC: Recent Labs  Lab 10/12/23 2055 10/12/23 2102  WBC 12.1*  --   NEUTROABS 11.1*  --   HGB 13.2 13.3  13.3  HCT 39.4 39.0  39.0  MCV 90.8  --   PLT 214  --    Cardiac Enzymes: No results for input(s): "CKTOTAL", "CKMB", "CKMBINDEX", "TROPONINI" in the last 168 hours.  BNP (last 3 results) Recent Labs    10/12/23 2055  BNP 119.6*    ProBNP (last 3 results) No results for input(s): "PROBNP" in the last 8760 hours.  CBG: No results for input(s): "GLUCAP" in the last 168 hours.  Radiological Exams on Admission: DG Chest Port 1 View Result Date: 10/12/2023 CLINICAL DATA:  Shortness of breath EXAM: PORTABLE CHEST 1 VIEW COMPARISON:  08/07/2017 FINDINGS: Multiple monitoring leads overlie the lower chest. Heart size appears within normal limits. Prior TAVR. Aortic atherosclerosis. No focal airspace consolidation,  pleural effusion, or pneumothorax. The visualized skeletal structures are unremarkable. IMPRESSION: No active disease. Electronically Signed   By: Duanne Guess D.O.   On: 10/12/2023 20:19    EKG: I independently viewed the EKG done and my findings are as followed: Sinus tachycardia rate of 135.  Nonspecific ST changes.  QTc 447.  Assessment/Plan Present on Admission:  Dyspnea  Principal Problem:   Dyspnea  Dyspnea with concern for dysphagia and aspiration Sudden onset dyspnea after choking on his dinner and vomiting x 1 Speech therapist consulted due to concern for dysphagia Aspiration precautions are in place Continue cefepime, initiated in the ED Obtain baseline procalcitonin  Lactic acidosis Initial lactic acid 3.3, repeat 3.2 Gentle IV fluid hydration NS at 50 cc/h x 12 hours Continue IV antibiotics. Trend lactic acid  Tachycardia and tachypnea, suspect due to aspiration Continue to treat underlying condition Continue to monitor on telemetry  Chronic HFpEF Euvolemic on  exam Last 2D echo done in 2020 revealed LVEF 55 to 60% Hold off home Lasix Closely monitor volume status while on gentle IV fluid hydration NS 50 cc/h x 12 hours. Monitor strict I's and O's and daily weight.  Dementia Reorient as needed Fall and aspiration precautions  Type 2 diabetes with hyperglycemia Last hemoglobin A1c 5.9 on 12/14/2018 Repeat hemoglobin A1c Start insulin sliding scale every 4 hours while NPO. N.p.o. until passes a swallow evaluation  Hyperlipidemia Resume home Lipitor  Hypertension BPs are currently soft Hold off home oral antihypertensives.  Physical debility PT OT assessment Fall precautions. TOC consulted to assist with DC planning.  The patient is from SNF.   Time: 75 minutes.   DVT prophylaxis: Subcu Lovenox daily  Code Status: Signed DNR form in the room with the patient.  Family Communication: None at bedside.  Disposition Plan: Admitted to telemetry medical unit  Consults called: TOC for DC planning.  Admission status: Observation status.   Status is: Observation    Darlin Drop MD [facility] Pager [phone]  If 7PM-7AM, please contact night-coverage [email or web address] Password [email or web address]  10/12/2023, 11:17 PM

## 2023-10-13 DIAGNOSIS — R0600 Dyspnea, unspecified: Secondary | ICD-10-CM | POA: Diagnosis not present

## 2023-10-13 LAB — PHOSPHORUS: Phosphorus: 2.9 mg/dL (ref 2.5–4.6)

## 2023-10-13 LAB — BASIC METABOLIC PANEL
Anion gap: 12 (ref 5–15)
BUN: 34 mg/dL — ABNORMAL HIGH (ref 8–23)
CO2: 20 mmol/L — ABNORMAL LOW (ref 22–32)
Calcium: 8.6 mg/dL — ABNORMAL LOW (ref 8.9–10.3)
Chloride: 105 mmol/L (ref 98–111)
Creatinine, Ser: 1.97 mg/dL — ABNORMAL HIGH (ref 0.61–1.24)
GFR, Estimated: 33 mL/min — ABNORMAL LOW (ref >60)
Glucose, Bld: 164 mg/dL — ABNORMAL HIGH (ref 70–99)
Potassium: 4.8 mmol/L (ref 3.5–5.1)
Sodium: 137 mmol/L (ref 135–145)

## 2023-10-13 LAB — GLUCOSE, CAPILLARY
Glucose-Capillary: 231 mg/dL — ABNORMAL HIGH (ref 70–99)
Glucose-Capillary: 188 mg/dL — ABNORMAL HIGH (ref 70–99)
Glucose-Capillary: 147 mg/dL — ABNORMAL HIGH (ref 70–99)
Glucose-Capillary: 142 mg/dL — ABNORMAL HIGH (ref 70–99)
Glucose-Capillary: 133 mg/dL — ABNORMAL HIGH (ref 70–99)

## 2023-10-13 LAB — BLOOD CULTURE ID PANEL (REFLEXED) - BCID2

## 2023-10-13 LAB — COMPREHENSIVE METABOLIC PANEL
ALT: 28 U/L (ref 0–44)
AST: 45 U/L — ABNORMAL HIGH (ref 15–41)
Albumin: 3.2 g/dL — ABNORMAL LOW (ref 3.5–5.0)
Alkaline Phosphatase: 120 U/L (ref 38–126)
Anion gap: 14 (ref 5–15)
BUN: 26 mg/dL — ABNORMAL HIGH (ref 8–23)
CO2: 20 mmol/L — ABNORMAL LOW (ref 22–32)
Calcium: 8.9 mg/dL (ref 8.9–10.3)
Chloride: 106 mmol/L (ref 98–111)
Creatinine, Ser: 1.87 mg/dL — ABNORMAL HIGH (ref 0.61–1.24)
GFR, Estimated: 35 mL/min — ABNORMAL LOW (ref >60)
Glucose, Bld: 231 mg/dL — ABNORMAL HIGH (ref 70–99)
Potassium: 5.7 mmol/L — ABNORMAL HIGH (ref 3.5–5.1)
Sodium: 140 mmol/L (ref 135–145)
Total Bilirubin: 1.1 mg/dL (ref <1.2)
Total Protein: 7 g/dL (ref 6.5–8.1)

## 2023-10-13 LAB — HEMOGLOBIN A1C
Hgb A1c MFr Bld: 6.3 % — ABNORMAL HIGH (ref 4.8–5.6)
Mean Plasma Glucose: 134.11 mg/dL

## 2023-10-13 LAB — CBC
HCT: 38.1 % — ABNORMAL LOW (ref 39.0–52.0)
Hemoglobin: 12.5 g/dL — ABNORMAL LOW (ref 13.0–17.0)
MCH: 30.2 pg (ref 26.0–34.0)
MCHC: 32.8 g/dL (ref 30.0–36.0)
MCV: 92 fL (ref 80.0–100.0)
Platelets: 237 10*3/uL (ref 150–400)
RBC: 4.14 MIL/uL — ABNORMAL LOW (ref 4.22–5.81)
RDW: 13.3 % (ref 11.5–15.5)
WBC: 14.2 10*3/uL — ABNORMAL HIGH (ref 4.0–10.5)
nRBC: 0 % (ref 0.0–0.2)

## 2023-10-13 LAB — PROCALCITONIN: Procalcitonin: 0.56 ng/mL

## 2023-10-13 LAB — LACTIC ACID, PLASMA
Lactic Acid, Venous: 5.2 mmol/L (ref 0.5–1.9)
Lactic Acid, Venous: 2.8 mmol/L (ref 0.5–1.9)

## 2023-10-13 LAB — MAGNESIUM: Magnesium: 1.8 mg/dL (ref 1.7–2.4)

## 2023-10-13 MED ORDER — IPRATROPIUM-ALBUTEROL 0.5-2.5 (3) MG/3ML IN SOLN
3.0000 mL | RESPIRATORY_TRACT | Status: DC | PRN
  Administered 2023-10-13 – 2023-10-15 (×4): 3 mL via RESPIRATORY_TRACT
  Filled 2023-10-13 (×4): qty 3

## 2023-10-13 MED ORDER — ENOXAPARIN SODIUM 30 MG/0.3ML IJ SOSY
30.0000 mg | PREFILLED_SYRINGE | INTRAMUSCULAR | Status: DC
  Administered 2023-10-13: 30 mg via SUBCUTANEOUS
  Filled 2023-10-13: qty 0.3

## 2023-10-13 MED ORDER — SODIUM CHLORIDE 0.9 % IV SOLN: 12.5000 mg | Freq: Three times a day (TID) | INTRAVENOUS | Status: DC | PRN

## 2023-10-13 MED ORDER — SODIUM BICARBONATE 8.4 % IV SOLN
INTRAVENOUS | Status: DC
  Filled 2023-10-13 (×2): qty 1000

## 2023-10-13 MED ORDER — INSULIN ASPART 100 UNIT/ML IJ SOLN
0.0000 [IU] | Freq: Three times a day (TID) | INTRAMUSCULAR | Status: DC
  Administered 2023-10-13 – 2023-10-14 (×2): 1 [IU] via SUBCUTANEOUS
  Administered 2023-10-14: 2 [IU] via SUBCUTANEOUS
  Administered 2023-10-14 – 2023-10-15 (×3): 1 [IU] via SUBCUTANEOUS
  Administered 2023-10-15: 2 [IU] via SUBCUTANEOUS

## 2023-10-13 MED ORDER — SODIUM CHLORIDE 0.9 % IV SOLN
2.0000 g | INTRAVENOUS | Status: DC
Start: 2023-10-13 — End: 2023-10-15
  Administered 2023-10-13 – 2023-10-14 (×2): 2 g via INTRAVENOUS
  Filled 2023-10-13 (×2): qty 20

## 2023-10-13 MED ORDER — SODIUM CHLORIDE 0.9 % IV SOLN
3.0000 g | Freq: Two times a day (BID) | INTRAVENOUS | Status: DC
  Administered 2023-10-13: 3 g via INTRAVENOUS
  Filled 2023-10-13: qty 8

## 2023-10-13 MED ORDER — CARVEDILOL 3.125 MG PO TABS
3.1250 mg | ORAL_TABLET | Freq: Every day | ORAL | Status: DC
  Administered 2023-10-13 – 2023-10-16 (×4): 3.125 mg via ORAL
  Filled 2023-10-13 (×4): qty 1

## 2023-10-13 NOTE — Evaluation (Signed)
Clinical/Bedside Swallow Evaluation Patient Details  Name: Edward Strickland MRN: [id number] Date of Birth: August 19, 1941  Today's Date: 10/13/2023 Time: SLP Start Time (ACUTE ONLY): 1025 SLP Stop Time (ACUTE ONLY): 1050 SLP Time Calculation (min) (ACUTE ONLY): 25 min  Past Medical History:  Past Medical History:  Diagnosis Date   Aortic stenosis, severe    CAD (coronary artery disease)    a. 06/2017: s/p DES to LAD.    Chronic systolic CHF (congestive heart failure) (HCC)    COPD (chronic obstructive pulmonary disease) (HCC)    Hypertension    S/P TAVR (transcatheter aortic valve replacement) 07/23/2017   29 mm Edwards Sapien 3 transcatheter heart valve placed via percutaneous right transfemoral approach   Past Surgical History:  Past Surgical History:  Procedure Laterality Date   CHEST TUBE INSERTION Right 07/23/2017   Procedure: INSERTION PLEURAL DRAINAGE CATHETER;  Surgeon: Purcell Nails, MD;  Location: [facility] OR;  Service: Thoracic;  Laterality: Right;   CORONARY STENT INTERVENTION N/A 07/17/2017   Procedure: CORONARY STENT INTERVENTION;  Surgeon: Kathleene Hazel, MD;  Location: [facility] INVASIVE CV LAB;  Service: Cardiovascular;  Laterality: N/A;   IR THORACENTESIS ASP PLEURAL SPACE W/IMG GUIDE  07/11/2017   MULTIPLE EXTRACTIONS WITH ALVEOLOPLASTY N/A 07/15/2017   Procedure: Extraction of tooth #'s 4,6,7,8,9,15, 20,21,22, 23 ,24,26,and 27 with alveoloplassty;  Surgeon: Charlynne Pander, DDS;  Location: [facility] OR;  Service: Oral Surgery;  Laterality: N/A;   REMOVAL OF PLEURAL DRAINAGE CATHETER N/A 08/07/2017   Procedure: REMOVAL OF PLEURAL DRAINAGE CATHETER;  Surgeon: Purcell Nails, MD;  Location: [facility] OR;  Service: Thoracic;  Laterality: N/A;   TEE WITHOUT CARDIOVERSION N/A 07/23/2017   Procedure: TRANSESOPHAGEAL ECHOCARDIOGRAM (TEE);  Surgeon: Kathleene Hazel, MD;  Location[facility] OR;  Service: Open Heart Surgery;  Laterality: N/A;   TRANSCATHETER AORTIC VALVE REPLACEMENT,  TRANSFEMORAL N/A 07/23/2017   Procedure: TRANSCATHETER AORTIC VALVE REPLACEMENT, TRANSFEMORAL;  Surgeon: Kathleene Hazel, MD;  Location: [facility] OR;  Service: Open Heart Surgery;  Laterality: N/A;   HPI:  focal airspace consolidation, pleural effusion, or pneumothorax    Assessment / Plan / Recommendation  Clinical Impression  Patient presnts with a mild oropharyngeal dysphagia as per this bedside swallow evaluation but he is likely at or near his baseline swallow function. SLP assessed his swallow with PO's of puree solids, mechanical soft solids and thin liquids. He is edentulous with no dentures and patient telling SLP that he lost them. Mildly prolonged mastication with graham crackers but with full oral clearance of boluses. Puree solid bites resulted in trace to mild amount of left labial anterior spillage. Patient with suspected swallow initiation delay with thin liquids as well as two swallows observed with each sip. Only one instance of cough observed during PO intake. Patient informed SLP that his food is cut up at his SNF and that they feed him "like a baby". He was able to hold cup and drink (both cup sips and straw sips) and was able manage utensil (spoon) to eat applesauce but he will benefit from setup assistance and feeding assistance with all meals. SLP recommending Dys 3 solids , thin liquids and will follow for toleration. SLP Visit Diagnosis: Dysphagia, unspecified (R13.10)    Aspiration Risk  Mild aspiration risk    Diet Recommendation Dysphagia 3 (Mech soft);Thin liquid    Liquid Administration via: Cup;Straw Medication Administration: Whole meds with puree Supervision: Patient able to self feed;Full supervision/cueing for compensatory strategies;Staff to assist with self feeding Compensations: Slow rate;Small  sips/bites Postural Changes: Seated upright at 90 degrees    Other  Recommendations Oral Care Recommendations: Oral care BID;Staff/trained caregiver to provide  oral care    Recommendations for follow up therapy are one component of a multi-disciplinary discharge planning process, led by the attending physician.  Recommendations may be updated based on patient status, additional functional criteria and insurance authorization.  Follow up Recommendations Skilled nursing-short term rehab (<3 hours/day)      Assistance Recommended at Discharge    Functional Status Assessment Patient has had a recent decline in their functional status and demonstrates the ability to make significant improvements in function in a reasonable and predictable amount of time.  Frequency and Duration min 2x/week  1 week       Prognosis Prognosis for improved oropharyngeal function: Good Barriers to Reach Goals: Cognitive deficits      Swallow Study   General Date of Onset: 10/12/23 HPI: focal airspace consolidation, pleural effusion, or pneumothorax Type of Study: Bedside Swallow Evaluation Previous Swallow Assessment: MBS in 2020 Diet Prior to this Study: NPO Temperature Spikes Noted: No Respiratory Status: Nasal cannula History of Recent Intubation: No Behavior/Cognition: Alert;Cooperative;Pleasant mood Oral Cavity Assessment: Within Functional Limits Oral Care Completed by SLP: Yes Oral Cavity - Dentition: Missing dentition Vision: Functional for self-feeding Self-Feeding Abilities: Needs set up;Needs assist;Able to feed self Patient Positioning: Upright in bed Baseline Vocal Quality: Normal Volitional Cough: Strong Volitional Swallow: Able to elicit    Oral/Motor/Sensory Function Overall Oral Motor/Sensory Function: Mild impairment Facial ROM: Reduced left Facial Symmetry: Abnormal symmetry left Facial Strength: Reduced left   Ice Chips     Thin Liquid Thin Liquid: Impaired Presentation: Straw;Cup;Self Fed Pharyngeal  Phase Impairments: Suspected delayed Swallow;Multiple swallows    Nectar Thick     Honey Thick     Puree Puree:  Impaired Presentation: Self Fed;Spoon Oral Phase Functional Implications: Left anterior spillage   Solid     Solid: Impaired Presentation: Self Fed Oral Phase Impairments: Impaired mastication     Angela Nevin, MA, CCC-SLP Speech Therapy

## 2023-10-13 NOTE — Progress Notes (Signed)
Pharmacy Antibiotic Note  Edward Strickland is a 82 y.o. male admitted from SNF 10/12/2023 with concern for AHRF 2/2 aspiration pneumonia. Patient had episode of choking and vomiting after dinner on 12/14.  CXR on 12/14 - no consolidation or active disease. Patient previously on empiric Cefepime x2. Pharmacy has now been consulted for Unasyn dosing.  New AKI with Scr 1.97 (admit 1.48). WBC 14.2. LA 3.2 >> 5 on bicarb gtt. T max 98.9  Plan: Start Unasyn 3g IV q12H Discontinue Cefepime 2g IV Q24H Follow up abx LOT, de-escalation as appropriate  Monitor renal function, WBC trend, lactic acidosis resolution, clinical status   Height: 5\' 7"  (170.2 cm) Weight: 77.3 kg (170 lb 6.7 oz) IBW/kg (Calculated) : 66.1  Temp (24hrs), Avg:98.4 F (36.9 C), Min:97.9 F (36.6 C), Max:98.9 F (37.2 C)  Recent Labs  Lab 10/12/23 2055 10/12/23 2102 10/12/23 2103 10/12/23 2241 10/13/23 0244 10/13/23 1009  WBC 12.1*  --   --   --  14.2*  --   CREATININE 1.48* 1.60*  --   --  1.87*  --   LATICACIDVEN  --   --  3.3* 3.2* 5.2* 2.8*    Estimated Creatinine Clearance: 28.5 mL/min (A) (by C-G formula based on SCr of 1.87 mg/dL (H)).    No Known Allergies  Antimicrobials this admission: Cefepime 12/14 >> 12/15 Unasyn 12/15 >>  Dose adjustments this admission: None   Microbiology results: 12/14 Bcx: ng <12h 12/14 Bcx: ng <24h 12/14 Resp panel: neg    Thank you for allowing pharmacy to be a part of this patient's care.  Surena Welge 10/13/2023 10:48 AM

## 2023-10-13 NOTE — Evaluation (Addendum)
Occupational Therapy Evaluation Patient Details Name: Edward Strickland MRN: [id number] DOB: Sep 22, 1941 Today's Date: 10/13/2023   History of Present Illness Pt is a 82 y.o. M who presents 10/12/2023 with dyspnea with concern for dysphagia and aspiration. 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   Clinical Impression   PTA, pt was at [facility] for long-term care and receive assistance for ADLs and use of hoyer lift for OOB to w/c. Pt able to self feed himself at [facility] with intermittent help from staff as needed. Pt currently performing at baseline with set up for self feeding, Total A for bathing/dressing/toileting, and Total A for bed mobility. Attempted sitting at EOB but not safe due to extension posture. Return to supine and optimize upright, sitting posture in bed. Recommend dc to [facility] with assistance for ADLs and mobility from staff. No further acute OT needs and will sign off. Thank you for referral.        If plan is discharge home, recommend the following: Two people to help with bathing/dressing/bathroom;Two people to help with walking and/or transfers    Functional Status Assessment  Patient has not had a recent decline in their functional status (baseline)  Equipment Recommendations  None recommended by OT    Recommendations for Other Services       Precautions / Restrictions Precautions Precautions: Fall Restrictions Weight Bearing Restrictions Per Provider Order: No      Mobility Bed Mobility Overal bed mobility: Needs Assistance Bed Mobility: Rolling Rolling: Total assist, +2 for physical assistance         General bed mobility comments: Total A + 2 for rolling to R/L for peri care and pad change. Pt able to grab railing with RUE to assist in maintaining sidelying    Transfers                   General transfer comment: unable      Balance                                            ADL either performed or assessed with clinical judgement   ADL Overall ADL's : Needs assistance/impaired                                       General ADL Comments: Total A for bathing, dressing, and toileting. Able to complete self feeding with increased time; noting tremors at RUE which is baseline. Patient reports they will help him at [facility] to self feed if needed     Vision         Perception         Praxis         Pertinent Vitals/Pain Pain Assessment Pain Assessment: Faces Faces Pain Scale: Hurts little more Pain Location: generalized with mobility Pain Descriptors / Indicators: Grimacing Pain Intervention(s): Monitored during session, Limited activity within patient's tolerance, Repositioned     Extremity/Trunk Assessment Upper Extremity Assessment Upper Extremity Assessment: LUE deficits/detail LUE Deficits / Details: Hemiparesis and arm in flexion/adduction. Tight fist with limited movement of fingers but able to perform minimal composite finger flexion/extension   Lower Extremity Assessment Lower Extremity Assessment: Defer to PT evaluation RLE Deficits / Details: + tremor. Able to perform limited heel slide and  wiggle toes. LLE Deficits / Details: Hemiparesis from prior CVA and increased tone. Plantarflexion and knee extension contracture       Communication Communication Communication: No apparent difficulties   Cognition Arousal: Alert Behavior During Therapy: WFL for tasks assessed/performed Overall Cognitive Status: History of cognitive impairments - at baseline                                 General Comments: Hx dementia. 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 Follows 1 step commands     General Comments  Supine: 113/72 (87), 87 HR, 91% SpO2 on 2L    Exercises     Shoulder Instructions      Home Living Family/patient expects to  be discharged to:: Skilled nursing facility                                 Additional Comments: [facility]      Prior Functioning/Environment Prior Level of Function : Needs assist  Cognitive Assist : ADLs (cognitive)   ADLs (Cognitive): Intermittent cues Physical Assist : ADLs (physical);Mobility (physical) Mobility (physical): Transfers ADLs (physical): Bathing;Dressing;Toileting Mobility Comments: Use of lift to/from w/c at [facility] ADLs Comments: Aide assist with bathing, dressing, and toileting        OT Problem List: Decreased strength;Decreased range of motion;Decreased activity tolerance;Impaired balance (sitting and/or standing);Decreased knowledge of use of DME or AE;Decreased knowledge of precautions;Decreased safety awareness      OT Treatment/Interventions:      OT Goals(Current goals can be found in the care plan section) Acute Rehab OT Goals Patient Stated Goal: Return to [facility] OT Goal Formulation: All assessment and education complete, DC therapy  OT Frequency:      Co-evaluation PT/OT/SLP Co-Evaluation/Treatment: Yes Reason for Co-Treatment: For patient/therapist safety;To address functional/ADL transfers;Complexity of the patient's impairments (multi-system involvement) PT goals addressed during session: Mobility/safety with mobility OT goals addressed during session: ADL's and self-care      AM-PAC OT "6 Clicks" Daily Activity     Outcome Measure Help from another person eating meals?: A Little Help from another person taking care of personal grooming?: A Lot Help from another person toileting, which includes using toliet, bedpan, or urinal?: Total Help from another person bathing (including washing, rinsing, drying)?: Total Help from another person to put on and taking off regular upper body clothing?: Total Help from another person to put on and taking off regular lower body clothing?: Total 6 Click Score: 9   End of Session  Equipment Utilized During Treatment: Oxygen Nurse Communication: Mobility status  Activity Tolerance: Patient tolerated treatment well Patient left: in bed;with call bell/phone within reach;with bed alarm set (with SLP)  OT Visit Diagnosis: Unsteadiness on feet (R26.81);Other abnormalities of gait and mobility (R26.89);Muscle weakness (generalized) (M62.81);Hemiplegia and hemiparesis Hemiplegia - Right/Left: Left Hemiplegia - dominant/non-dominant: Non-Dominant Hemiplegia - caused by: Cerebral infarction (Old)                Time: 1610-9604 OT Time Calculation (min): 21 min Charges:  OT General Charges $OT Visit: 1 Visit OT Evaluation $OT Eval Moderate Complexity: 1 Mod  Maleke Feria MSOT, OTR/L Acute Rehab Office: [phone]  Theodoro Grist Albaro Deviney 10/13/2023, 12:29 PM

## 2023-10-13 NOTE — Progress Notes (Signed)
[facility] PROGRESS NOTE  Edward Strickland (DOB: [birth year]) WUJ:[id number] PCP: Pcp, No  Brief Narrative: Edward Strickland is an 82 y.o. male with a history of dementia, chronic HFpEF, hemorrhagic CVA with left hemiparesis, HLD who presented from [facility] [place] [facility] [facility] to the ED on 10/12/2023 with shortness of breath and wheezing after an episode of choking/vomiting. He was tachycardic with leukocytosis, elevated lactic acid, and was requiring 2L O2 to maintain saturations. Though CXR did not yet show opacity, antibiotics were given for aspiration pneumonia   Subjective: Some shortness of breath, had abd pain previously he thinks but none now. Would like to eat/drink. Denies chest pain.   Objective: BP (!) 106/92 (BP Location: Right Arm)   Pulse 76   Temp 97.9 F (36.6 C) (Oral)   Resp 19   Ht 5\' 7"  (1.702 m)   Wt 77.3 kg   SpO2 99%   BMI 26.69 kg/m   Gen: No distress Pulm: Clear, nonlabored with supplemental oxygen anterolaterally.  CV: RRR, trace edema in legs.  GI: Soft, NT, ND, +BS  Neuro: Alert, interactive, cooperative, weakness without flaccidity on left. No new focal deficits. Ext: Warm, no deformities Skin: No rashes, lesions or ulcers on visualized skin   Assessment & Plan: Acute hypoxic respiratory failure due to aspiration pneumonitis, pneumonia:  - SLP evaluation, then initiate appropriate diet.  - Continue antibiotics, switch to unasyn.  - Aspiration precautions. PCT 0.56. Will trend.   Lactic acidosis: Leukocytosis (complicated by steroid administration), lactic acidosis worsening, ongoing tachycardia.  - Repeat labs to confirm not in error as hemodynamics have improved.  - Continue IVF, change to bicarb given his acidosis and tachypnea, and antibiotics for now.   Hyperkalemia:  - Repeat to verify, then initiate Tx as indicated.   COPD: Some wheezing initially likely upper airway. Was given steroids, though no ongoing wheezing and concomitant infection so will  hold further steroids. - prn duoneb  CAD, HLD: s/p DES to LAD 2018. No chest pain.  - Continue beta blocker, hold statin to minimize pill burden acutely. Not on aspirin presumably due to hx hemorrhagic CVA.  History of CVA, left hemiparesis:  - Given his renal impairment, we've not yet reordered baclofen. Will monitor closely. No spasticity currently noted.   Chronic HFpEF: BNP slightly elevated at 119, troponin flat at 22.   - Restart home coreg (3.125mg  daily) given tachycardia. .  - Hold lasix 40mg  and entresto for now  AS s/p TAVR 2018: Noted  T2DM: HbA1c 6.3%.  - Continue SSI q4h and transition to AC/HS once cleared for diet.   Dementia:  - Delirium precautions  Hiccups: Trial lowest dose thorazine prn  [name], MD [facility] [email or web address] 10/13/2023, 10:28 AM

## 2023-10-13 NOTE — Progress Notes (Signed)
PHARMACY - PHYSICIAN COMMUNICATION CRITICAL VALUE ALERT - BLOOD CULTURE IDENTIFICATION (BCID)  Edward Strickland is an 82 y.o. male who presented to [facility] on 10/12/2023 with a chief complaint of choking/vomiting after dinner, aspiration pneumonia  BCID call received, growth in 2 of 4 of GNR identified as kleb oxytoca  Name of physician (or Provider) Contacted: Dr Jarvis Newcomer  Current antibiotics:  Cefepime 12/14 >> 12/15 Unasyn 12/15 >>  Changes to prescribed antibiotics recommended:  Ceftriaxone 2g q24h per rapid ID treatment algorithm Duration of therapy pending clinical improvement  No results found for this or any previous visit.  Rutherford Nail, PharmD PGY2 Critical Care Pharmacy Resident 10/13/2023  1:33 PM

## 2023-10-13 NOTE — Evaluation (Addendum)
Physical Therapy Evaluation and Discharge Patient Details Name: Edward Strickland MRN: [id number] DOB: 1941/06/02 Today's Date: 10/13/2023  History of Present Illness  Pt is a 82 y.o. M who presents 10/12/2023 with dyspnea with concern for dysphagia and aspiration. 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  Clinical Impression  Patient evaluated by Physical Therapy with no further acute PT needs identified. Pt from SNF, where he reports staff transfers him to and from the wheelchair with a hoyer lift. Pt presents with left hemiparesis and tone from prior stroke. Pt dependent for bed mobility. Attempted to sit up on the edge of bed, however, due to safety concern of increased extensor tone and pt sliding forward, returned and repositioned in supine. All education has been completed and the patient has no further questions. No follow-up Physical Therapy or equipment needs. PT is signing off. Thank you for this referral.       If plan is discharge home, recommend the following: Two people to help with walking and/or transfers;Two people to help with bathing/dressing/bathroom   Can travel by private vehicle   No    Equipment Recommendations None recommended by PT  Recommendations for Other Services       Functional Status Assessment Patient has had a recent decline in their functional status and/or demonstrates limited ability to make significant improvements in function in a reasonable and predictable amount of time     Precautions / Restrictions Precautions Precautions: Fall Restrictions Weight Bearing Restrictions Per Provider Order: No      Mobility  Bed Mobility Overal bed mobility: Needs Assistance Bed Mobility: Rolling Rolling: Total assist, +2 for physical assistance         General bed mobility comments: TotalA + 2 for rolling to R/L for peri care and pad change    Transfers                   General  transfer comment: unable    Ambulation/Gait                  Stairs            Wheelchair Mobility     Tilt Bed    Modified Rankin (Stroke Patients Only)       Balance                                             Pertinent Vitals/Pain Pain Assessment Pain Assessment: Faces Faces Pain Scale: Hurts little more Pain Location: generalized with mobility Pain Descriptors / Indicators: Grimacing Pain Intervention(s): Monitored during session, Repositioned    Home Living Family/patient expects to be discharged to:: Skilled nursing facility                   Additional Comments: [facility]    Prior Function Prior Level of Function : Needs assist  Cognitive Assist : ADLs (cognitive)   ADLs (Cognitive): Intermittent cues Physical Assist : ADLs (physical);Mobility (physical) Mobility (physical): Transfers ADLs (physical): Bathing;Dressing;Toileting Mobility Comments: Use of lift to/from w/c at [facility] ADLs Comments: Aide assist with bathing, dressing, and toileting     Extremity/Trunk Assessment   Upper Extremity Assessment Upper Extremity Assessment: Defer to OT evaluation LUE Deficits / Details: (P) Hemiparesis and arm in flexion/adduction. Tight fist with limited movement of fingers    Lower Extremity Assessment  Lower Extremity Assessment: RLE deficits/detail;LLE deficits/detail RLE Deficits / Details: + tremor. Able to perform limited heel slide and wiggle toes. LLE Deficits / Details: Hemiparesis from prior CVA and increased tone. Plantarflexion and knee extension contracture       Communication   Communication Communication: No apparent difficulties  Cognition Arousal: Alert Behavior During Therapy: WFL for tasks assessed/performed Overall Cognitive Status: History of cognitive impairments - at baseline                                 General Comments: Hx dementia. Pt pleasant, difficulty  answering PLOF questions, conversing about his friend who he chopped wood with. Follows 1 step commands        General Comments General comments (skin integrity, edema, etc.): Supine: 113/72 (87), 87 HR, 91% SpO2 on 2L o2    Exercises     Assessment/Plan    PT Assessment Patient does not need any further PT services  PT Problem List         PT Treatment Interventions      PT Goals (Current goals can be found in the Care Plan section)  Acute Rehab PT Goals Patient Stated Goal: did not state PT Goal Formulation: All assessment and education complete, DC therapy    Frequency       Co-evaluation PT/OT/SLP Co-Evaluation/Treatment: Yes Reason for Co-Treatment: For patient/therapist safety;To address functional/ADL transfers;Complexity of the patient's impairments (multi-system involvement) PT goals addressed during session: Mobility/safety with mobility         AM-PAC PT "6 Clicks" Mobility  Outcome Measure Help needed turning from your back to your side while in a flat bed without using bedrails?: Total Help needed moving from lying on your back to sitting on the side of a flat bed without using bedrails?: Total Help needed moving to and from a bed to a chair (including a wheelchair)?: Total Help needed standing up from a chair using your arms (e.g., wheelchair or bedside chair)?: Total Help needed to walk in hospital room?: Total Help needed climbing 3-5 steps with a railing? : Total 6 Click Score: 6    End of Session   Activity Tolerance: Patient tolerated treatment well Patient left: in bed;with call bell/phone within reach;with bed alarm set Nurse Communication: Mobility status;Need for lift equipment PT Visit Diagnosis: Other abnormalities of gait and mobility (R26.89)    Time: 1610-9604 PT Time Calculation (min) (ACUTE ONLY): 20 min   Charges:   PT Evaluation $PT Eval Low Complexity: 1 Low   PT General Charges $$ ACUTE PT VISIT: 1 Visit          Lillia Pauls, PT, DPT Acute Rehabilitation Services Office [phone]   Norval Morton 10/13/2023, 10:30 AM

## 2023-10-14 DIAGNOSIS — L89321 Pressure ulcer of left buttock, stage 1: Secondary | ICD-10-CM | POA: Diagnosis present

## 2023-10-14 DIAGNOSIS — I2510 Atherosclerotic heart disease of native coronary artery without angina pectoris: Secondary | ICD-10-CM | POA: Diagnosis present

## 2023-10-14 DIAGNOSIS — Z79899 Other long term (current) drug therapy: Secondary | ICD-10-CM | POA: Diagnosis not present

## 2023-10-14 DIAGNOSIS — Z8249 Family history of ischemic heart disease and other diseases of the circulatory system: Secondary | ICD-10-CM | POA: Diagnosis not present

## 2023-10-14 DIAGNOSIS — J690 Pneumonitis due to inhalation of food and vomit: Secondary | ICD-10-CM | POA: Diagnosis present

## 2023-10-14 DIAGNOSIS — A4159 Other Gram-negative sepsis: Secondary | ICD-10-CM | POA: Diagnosis present

## 2023-10-14 DIAGNOSIS — E1165 Type 2 diabetes mellitus with hyperglycemia: Secondary | ICD-10-CM | POA: Diagnosis present

## 2023-10-14 DIAGNOSIS — E872 Acidosis, unspecified: Secondary | ICD-10-CM | POA: Diagnosis present

## 2023-10-14 DIAGNOSIS — F03911 Unspecified dementia, unspecified severity, with agitation: Secondary | ICD-10-CM | POA: Diagnosis not present

## 2023-10-14 DIAGNOSIS — N179 Acute kidney failure, unspecified: Secondary | ICD-10-CM | POA: Diagnosis present

## 2023-10-14 DIAGNOSIS — Z955 Presence of coronary angioplasty implant and graft: Secondary | ICD-10-CM | POA: Diagnosis not present

## 2023-10-14 DIAGNOSIS — I69254 Hemiplegia and hemiparesis following other nontraumatic intracranial hemorrhage affecting left non-dominant side: Secondary | ICD-10-CM | POA: Diagnosis not present

## 2023-10-14 DIAGNOSIS — Z87891 Personal history of nicotine dependence: Secondary | ICD-10-CM | POA: Diagnosis not present

## 2023-10-14 DIAGNOSIS — I5042 Chronic combined systolic (congestive) and diastolic (congestive) heart failure: Secondary | ICD-10-CM | POA: Diagnosis present

## 2023-10-14 DIAGNOSIS — Z20822 Contact with and (suspected) exposure to covid-19: Secondary | ICD-10-CM | POA: Diagnosis present

## 2023-10-14 DIAGNOSIS — I110 Hypertensive heart disease with heart failure: Secondary | ICD-10-CM | POA: Diagnosis present

## 2023-10-14 DIAGNOSIS — R0600 Dyspnea, unspecified: Secondary | ICD-10-CM | POA: Diagnosis present

## 2023-10-14 DIAGNOSIS — J189 Pneumonia, unspecified organism: Secondary | ICD-10-CM | POA: Diagnosis present

## 2023-10-14 DIAGNOSIS — E785 Hyperlipidemia, unspecified: Secondary | ICD-10-CM | POA: Diagnosis present

## 2023-10-14 DIAGNOSIS — J440 Chronic obstructive pulmonary disease with acute lower respiratory infection: Secondary | ICD-10-CM | POA: Diagnosis present

## 2023-10-14 DIAGNOSIS — Z1611 Resistance to penicillins: Secondary | ICD-10-CM | POA: Diagnosis present

## 2023-10-14 DIAGNOSIS — J441 Chronic obstructive pulmonary disease with (acute) exacerbation: Secondary | ICD-10-CM | POA: Diagnosis present

## 2023-10-14 DIAGNOSIS — E875 Hyperkalemia: Secondary | ICD-10-CM | POA: Diagnosis present

## 2023-10-14 DIAGNOSIS — J9601 Acute respiratory failure with hypoxia: Secondary | ICD-10-CM | POA: Diagnosis present

## 2023-10-14 DIAGNOSIS — Z953 Presence of xenogenic heart valve: Secondary | ICD-10-CM | POA: Diagnosis not present

## 2023-10-14 LAB — BASIC METABOLIC PANEL
Anion gap: 8 (ref 5–15)
BUN: 35 mg/dL — ABNORMAL HIGH (ref 8–23)
CO2: 27 mmol/L (ref 22–32)
Calcium: 8.1 mg/dL — ABNORMAL LOW (ref 8.9–10.3)
Chloride: 101 mmol/L (ref 98–111)
Creatinine, Ser: 1.71 mg/dL — ABNORMAL HIGH (ref 0.61–1.24)
GFR, Estimated: 39 mL/min — ABNORMAL LOW (ref >60)
Glucose, Bld: 139 mg/dL — ABNORMAL HIGH (ref 70–99)
Potassium: 3.8 mmol/L (ref 3.5–5.1)
Sodium: 136 mmol/L (ref 135–145)

## 2023-10-14 LAB — PROCALCITONIN: Procalcitonin: 0.49 ng/mL

## 2023-10-14 LAB — GLUCOSE, CAPILLARY
Glucose-Capillary: 115 mg/dL — ABNORMAL HIGH (ref 70–99)
Glucose-Capillary: 135 mg/dL — ABNORMAL HIGH (ref 70–99)
Glucose-Capillary: 124 mg/dL — ABNORMAL HIGH (ref 70–99)
Glucose-Capillary: 162 mg/dL — ABNORMAL HIGH (ref 70–99)
Glucose-Capillary: 125 mg/dL — ABNORMAL HIGH (ref 70–99)

## 2023-10-14 MED ORDER — METHYLPREDNISOLONE SODIUM SUCC 40 MG IJ SOLR
40.0000 mg | Freq: Every day | INTRAMUSCULAR | Status: DC
  Administered 2023-10-14 – 2023-10-16 (×3): 40 mg via INTRAVENOUS
  Filled 2023-10-14 (×3): qty 1

## 2023-10-14 MED ORDER — DEXTROSE-SODIUM CHLORIDE 5-0.45 % IV SOLN: INTRAVENOUS | Status: DC

## 2023-10-14 MED ORDER — ENOXAPARIN SODIUM 40 MG/0.4ML IJ SOSY
40.0000 mg | PREFILLED_SYRINGE | INTRAMUSCULAR | Status: DC
Start: 2023-10-14 — End: 2023-10-16
  Administered 2023-10-14 – 2023-10-15 (×2): 40 mg via SUBCUTANEOUS
  Filled 2023-10-14 (×2): qty 0.4

## 2023-10-14 NOTE — Progress Notes (Signed)
[facility] PROGRESS NOTE  Edward Strickland (DOB: July 19, 1941) BJY:[id number] PCP: Pcp, No  Brief Narrative: Brailon Gojcaj is an 82 y.o. male with a history of dementia, chronic HFpEF, hemorrhagic CVA with left hemiparesis, HLD who presented from [facility] to the ED on 10/12/2023 with shortness of breath and wheezing after an episode of choking/vomiting. He was tachycardic with leukocytosis, elevated lactic acid, and was requiring 2L O2 to maintain saturations. Though CXR did not yet show opacity, antibiotics were given for aspiration pneumonia   Subjective: Feels ok, had wheezing earlier and dyspnea per pt and RN. Given nebs with some improvement, still short of breath. He denies any abdominal discomfort. Ate breakfast and lunch, per RN, and had BM this morning.   Objective: BP (!) 115/55 (BP Location: Right Arm)   Pulse 92   Temp 98 F (36.7 C) (Oral)   Resp 15   Ht 5\' 7"  (1.702 m)   Wt 81.1 kg   SpO2 97%   BMI 28.00 kg/m   Gen: Chronically ill-appearing male in no distress Pulm: End expiratory wheezes without crackles. Nonlabored.  CV: RRR, NSR on monitor, no MRG, trace edema GI: Soft, protuberant but not tender at all, +BS. Neuro: Alert and interactive. Diffuse waxing/waning tremor at rest is noted. No new focal deficits. Ext: Warm, no deformities Skin: No new rashes, lesions or ulcers on visualized skin   Assessment & Plan: Acute hypoxic respiratory failure due to aspiration pneumonitis, pneumonia, and AECOPD:  - SLP evaluation >> continue dysphagia 3 diet. Aspiration precautions - Continue antibiotics as below - Wean oxygen, treat AECOPD as below.  Lactic acidosis, sepsis due to Klebsiella oxytoca bacteremia: No urinalysis sent on admission.  - Acidosis and anion gap resolved. LA improved. Hemodynamics improving. Will continue ceftriaxone 2g IV q24h pending susceptibility testing (still pending).   Hyperkalemia:  - Resolved without intervention  AECOPD: Some  wheezing initially, given steroids. Then resolved, now has returned, will start back on steroids trying to balance risk of steroids in setting of bacteremia with risk of bronchospasm.  - prn duoneb  CAD, HLD: s/p DES to LAD 2018. No chest pain.  - Continue beta blocker, hold statin to minimize pill burden acutely. Not on aspirin presumably due to hx hemorrhagic CVA.  History of CVA, left hemiparesis:  - Given his renal impairment, we've not yet reordered baclofen. Will monitor closely. No spasticity currently noted.   Chronic HFpEF: BNP slightly elevated at 119, troponin flat at 22.   - Restarted home coreg (3.125mg  daily) given tachycardia.  - Hold lasix 40mg  and entresto again today, possibly restart 12/17.  AS s/p TAVR 2018: Noted  AKI: Improving.  T2DM: HbA1c 6.3%.  - Continue SSI q4h and transition to AC/HS once cleared for diet.   Dementia:  - Delirium precautions  Hiccups: Trial lowest dose thorazine prn  [name], MD [facility] [email or web address] 10/14/2023, 12:29 PM

## 2023-10-14 NOTE — TOC Initial Note (Addendum)
Transition of Care [facility]) - Initial/Assessment Note    Patient Details  Name: Edward Strickland MRN: [id number] Date of Birth: [birth year]  Transition of Care [facility]) CM/SW Contact:    Michaela Corner, LCSWA Phone Number: 10/14/2023, 11:28 AM  Clinical Narrative:    CSW spoke with Kennedy Bucker, at [employer] - per Fajardo pt if from [facility] and can DC today when medically stable. Kennedy Bucker states if ins Berkley Harvey is needed that [facility] can submit for it on their end while pt is at the facility. CSW updated treatment team.       12:27PM: Per MD, pt can potentially DC 12/18   Expected Discharge Plan: Skilled Nursing Facility Barriers to Discharge: No Barriers Identified   Patient Goals and CMS Choice Patient states their goals for this hospitalization and ongoing recovery are:: Unable to assess          Expected Discharge Plan and Services In-house Referral: Clinical Social Work     Living arrangements for the past 2 months: Skilled Nursing Facility                                      Prior Living Arrangements/Services Living arrangements for the past 2 months: Skilled Nursing Facility Lives with:: Facility Resident Patient language and need for interpreter reviewed:: Yes        Need for Family Participation in Patient Care: No (Comment) Care giver support system in place?: No (comment)   Criminal Activity/Legal Involvement Pertinent to Current Situation/Hospitalization: No - Comment as needed  Activities of Daily Living   ADL Screening (condition at time of admission) Independently performs ADLs?: No Does the patient have a NEW difficulty with bathing/dressing/toileting/self-feeding that is expected to last >3 days?: No Does the patient have a NEW difficulty with getting in/out of bed, walking, or climbing stairs that is expected to last >3 days?: No Does the patient have a NEW difficulty with communication that is expected to last >3 days?: No Is the patient deaf or  have difficulty hearing?: No Does the patient have difficulty seeing, even when wearing glasses/contacts?: No Does the patient have difficulty concentrating, remembering, or making decisions?: No  Permission Sought/Granted                  Emotional Assessment   Attitude/Demeanor/Rapport: Unable to Assess Affect (typically observed): Unable to Assess Orientation: : Oriented to Self, Oriented to Place, Oriented to  Time, Oriented to Situation Alcohol / Substance Use: Not Applicable Psych Involvement: No (comment)  Admission diagnosis:  Dyspnea [R06.00] Dyspnea, unspecified type [R06.00] Patient Active Problem List   Diagnosis Date Noted   Dyspnea 10/12/2023   Hypertensive emergency 12/16/2018   Hyperlipidemia 12/16/2018   ICH (intracerebral hemorrhage) (HCC) - R basal ganglia d/t HTN 12/12/2018   S/P TAVR (transcatheter aortic valve replacement) 07/23/2017   Coronary artery disease involving native coronary artery of native heart with unstable angina pectoris (HCC)    Pleural effusion    Chronic systolic congestive heart failure (HCC) 07/09/2017   Aortic stenosis 07/09/2017   CKD (chronic kidney disease), stage III (HCC) 07/09/2017   Essential hypertension 07/09/2017   COPD (chronic obstructive pulmonary disease) (HCC) 07/09/2017   PCP:  Oneita Hurt No Pharmacy:   [id number] - [place],  - [address] [facility] [place] [address] Phone: [phone] Fax: [phone]     Social Drivers of Health (SDOH)  Social History: SDOH Screenings   Food Insecurity: Patient Unable To Answer (10/13/2023)  Housing: Patient Unable To Answer (10/13/2023)  Transportation Needs: Patient Unable To Answer (10/13/2023)  Utilities: Patient Unable To Answer (10/13/2023)  Tobacco Use: Medium Risk (10/12/2023)   SDOH Interventions:     Readmission Risk Interventions     No data to display

## 2023-10-14 NOTE — Plan of Care (Signed)

## 2023-10-14 NOTE — Care Management Obs Status (Signed)
MEDICARE OBSERVATION STATUS NOTIFICATION   Patient Details  Name: Cian Labrie MRN: [id number] Date of Birth: 1941/07/28   Medicare Observation Status Notification Given:  Yes    Ronny Bacon, RN 10/14/2023, 7:32 AM

## 2023-10-15 ENCOUNTER — Inpatient Hospital Stay (HOSPITAL_COMMUNITY): Payer: Medicare Other

## 2023-10-15 DIAGNOSIS — R0600 Dyspnea, unspecified: Secondary | ICD-10-CM | POA: Diagnosis not present

## 2023-10-15 LAB — CULTURE, BLOOD (ROUTINE X 2): Special Requests: ADEQUATE

## 2023-10-15 LAB — CBC
HCT: 35.3 % — ABNORMAL LOW (ref 39.0–52.0)
Hemoglobin: 11.6 g/dL — ABNORMAL LOW (ref 13.0–17.0)
MCH: 30.1 pg (ref 26.0–34.0)
MCHC: 32.9 g/dL (ref 30.0–36.0)
MCV: 91.7 fL (ref 80.0–100.0)
Platelets: 255 10*3/uL (ref 150–400)
RBC: 3.85 MIL/uL — ABNORMAL LOW (ref 4.22–5.81)
RDW: 13.7 % (ref 11.5–15.5)
WBC: 13.3 10*3/uL — ABNORMAL HIGH (ref 4.0–10.5)
nRBC: 0 % (ref 0.0–0.2)

## 2023-10-15 LAB — BASIC METABOLIC PANEL
Anion gap: 13 (ref 5–15)
BUN: 26 mg/dL — ABNORMAL HIGH (ref 8–23)
CO2: 23 mmol/L (ref 22–32)
Calcium: 8.6 mg/dL — ABNORMAL LOW (ref 8.9–10.3)
Chloride: 102 mmol/L (ref 98–111)
Creatinine, Ser: 1.64 mg/dL — ABNORMAL HIGH (ref 0.61–1.24)
GFR, Estimated: 42 mL/min — ABNORMAL LOW (ref >60)
Glucose, Bld: 140 mg/dL — ABNORMAL HIGH (ref 70–99)
Potassium: 3.7 mmol/L (ref 3.5–5.1)
Sodium: 138 mmol/L (ref 135–145)

## 2023-10-15 LAB — GLUCOSE, CAPILLARY
Glucose-Capillary: 110 mg/dL — ABNORMAL HIGH (ref 70–99)
Glucose-Capillary: 153 mg/dL — ABNORMAL HIGH (ref 70–99)
Glucose-Capillary: 134 mg/dL — ABNORMAL HIGH (ref 70–99)
Glucose-Capillary: 142 mg/dL — ABNORMAL HIGH (ref 70–99)

## 2023-10-15 MED ORDER — AMOXICILLIN-POT CLAVULANATE 875-125 MG PO TABS
1.0000 | ORAL_TABLET | Freq: Two times a day (BID) | ORAL | Status: DC
  Administered 2023-10-15 – 2023-10-16 (×3): 1 via ORAL
  Filled 2023-10-15 (×5): qty 1

## 2023-10-15 MED ORDER — HALOPERIDOL LACTATE 5 MG/ML IJ SOLN
1.0000 mg | Freq: Four times a day (QID) | INTRAMUSCULAR | Status: DC | PRN
  Administered 2023-10-16 (×2): 1 mg via INTRAVENOUS
  Filled 2023-10-15 (×2): qty 1

## 2023-10-15 MED ORDER — FUROSEMIDE 40 MG PO TABS
40.0000 mg | ORAL_TABLET | Freq: Every day | ORAL | Status: DC
  Administered 2023-10-15 – 2023-10-16 (×2): 40 mg via ORAL
  Filled 2023-10-15 (×2): qty 1

## 2023-10-15 NOTE — Progress Notes (Signed)
[facility] PROGRESS NOTE  Edward Strickland (DOB: [birth year]) ZOX:[id number] PCP: Pcp, No  Brief Narrative: Edward Strickland is an 82 y.o. male with a history of dementia, chronic HFpEF, hemorrhagic CVA with left hemiparesis, HLD who presented from [facility] to the ED on 10/12/2023 with shortness of breath and wheezing after an episode of choking/vomiting. He was tachycardic with leukocytosis, elevated lactic acid, and was requiring 2L O2 to maintain saturations. Though CXR did not yet show opacity, antibiotics were given for aspiration pneumonia   Subjective: He has no complaints. NT in the room at time of assessment has worked with him in his facility, states he's at his mental baseline, at times worried that he's up on a ladder wanting help to get down despite just being in bed.   Objective: BP 118/65 (BP Location: Right Arm)   Pulse 92   Temp 98.7 F (37.1 C) (Oral)   Resp 18   Ht 5\' 7"  (1.702 m)   Wt 79.1 kg   SpO2 93%   BMI 27.31 kg/m   Gen: No distress, chronically ill-appearing Pulm: Nonlabored, still on 2L O2, no wheezes or crackles.   CV: RRR, no MRG or edema GI: Soft, protuberant but not tender at all, normal bowel sounds Neuro: Alert and delirious but responsive, stable chronic deficits. Ext: Warm, no deformities Skin: No rashes, lesions or ulcers on visualized skin   Assessment & Plan: Acute hypoxic respiratory failure due to aspiration pneumonitis, pneumonia, and AECOPD:  - SLP evaluation >> continue dysphagia 3 diet. Aspiration precautions - Continue antibiotics as below - Wean oxygen, treat AECOPD as below.  Lactic acidosis, sepsis due to Klebsiella oxytoca bacteremia: No urinalysis sent on admission.  - Acidosis and anion gap resolved. LA improved. Hemodynamics improving. Susceptibilities show resistance only to ampicillin. Will change to augmentin per discussion with ID. No further work up for source is needed.   Hyperkalemia:  - Resolved without  intervention  AECOPD: Some wheezing initially, given steroids, resolved and has returned, so steroids restarted.  - Continue steroids  - Check CXR since unable to wean from oxygen. - prn duoneb  CAD, HLD: s/p DES to LAD 2018. No chest pain.  - Continue beta blocker, hold statin to minimize pill burden acutely. Not on aspirin presumably due to hx hemorrhagic CVA.  History of CVA, left hemiparesis:  - Given his renal impairment, we've not yet reordered baclofen. Will monitor closely. No spasticity currently noted.   Chronic HFpEF: BNP slightly elevated at 119, troponin flat at 22.   - Restarted home coreg (3.125mg  daily) given tachycardia.  - Hold lasix 40mg  and entresto again today, possibly restart 12/17.  AS s/p TAVR 2018: Noted  AKI: Improving.  T2DM: HbA1c 6.3%.  - Continue SSI q4h and transition to AC/HS once cleared for diet.   Dementia:  - Delirium precautions  Hiccups: Trial lowest dose thorazine prn  [name], MD [facility] [email or web address] 10/15/2023, 12:59 PM

## 2023-10-15 NOTE — Progress Notes (Signed)
  Patient is agitated and restless.  Otherwise hemodynamically stable. - Ordered Haldol as needed - Continue delirium precaution.

## 2023-10-15 NOTE — Progress Notes (Signed)
Speech Language Pathology Treatment: Dysphagia  Patient Details Name: Edward Strickland MRN: [id number] DOB: 1941/09/10 Today's Date: 10/15/2023 Time: 1425-1440 SLP Time Calculation (min) (ACUTE ONLY): 15 min  Assessment / Plan / Recommendation Clinical Impression  Patient seen by SLP for skilled treatment focused on dysphagia goals. Patient was awake and alert, asking SLP to help him get into his chair. He also inquired about the IV pole, asking if it was a walking cane. He is pleasantly confused and tangential when talking, telling SLP of gold hidden on his Uncle's property. He was easily redirected but required frequent redirection. He was receptive to drinking thin liquids (water) and was able to feed self via straw sips. SLP continues to suspect swallow initiation delay but no overt s/s aspiration observed. RN reported that patient has not been having any coughing with PO intake and she feels that Dys 3 (mechanical soft) solids are well tolerated by him. SLP recommending to continue on current diet and  no further skilled SLP intervention warranted at this venue of care. SNF level SLP services recommended upon discharge from hospital.    HPI HPI: Pt is a 82 y.o. M who presents 10/12/2023 with dyspnea with concern for dysphagia and aspiration. 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      SLP Plan  All goals met      Recommendations for follow up therapy are one component of a multi-disciplinary discharge planning process, led by the attending physician.  Recommendations may be updated based on patient status, additional functional criteria and insurance authorization.    Recommendations  Diet recommendations: Dysphagia 3 (mechanical soft);Thin liquid Liquids provided via: Straw;Cup Medication Administration: Whole meds with puree Supervision: Patient able to self feed;Staff to assist with self feeding Compensations: Slow rate;Small  sips/bites Postural Changes and/or Swallow Maneuvers: Seated upright 90 degrees                  Oral care BID;Staff/trained caregiver to provide oral care   Frequent or constant Supervision/Assistance Dysphagia, unspecified (R13.10)     All goals met   Angela Nevin, MA, CCC-SLP Speech Therapy

## 2023-10-15 NOTE — Plan of Care (Signed)

## 2023-10-16 DIAGNOSIS — R0600 Dyspnea, unspecified: Secondary | ICD-10-CM | POA: Diagnosis not present

## 2023-10-16 LAB — GLUCOSE, CAPILLARY: Glucose-Capillary: 108 mg/dL — ABNORMAL HIGH (ref 70–99)

## 2023-10-16 MED ORDER — AMOXICILLIN-POT CLAVULANATE 875-125 MG PO TABS: 1.0000 | ORAL_TABLET | Freq: Two times a day (BID) | ORAL | 0 refills | Status: AC

## 2023-10-16 MED ORDER — PREDNISONE 20 MG PO TABS: 40.0000 mg | ORAL_TABLET | Freq: Every day | ORAL | 0 refills | Status: AC

## 2023-10-16 NOTE — Discharge Summary (Signed)
Physician Discharge Summary   Patient: Edward Strickland MRN: [id number] DOB: 02-04-1941  Admit date:     10/12/2023  Discharge date: 10/16/23  Discharge Physician: [name]   PCP: Pcp, No   Recommendations at discharge:  Follow up with PCP in 1-2 weeks, suggest recheck of CBC and BMP. Continue dysphagia diet.   Discharge Diagnoses: Principal Problem:   Dyspnea  Hospital Course: Edward Strickland is an 82 y.o. male with a history of dementia, chronic HFpEF, hemorrhagic CVA with left hemiparesis, HLD who presented from [facility] to the ED on 10/12/2023 with shortness of breath and wheezing after an episode of choking/vomiting. He was tachycardic with leukocytosis, elevated lactic acid, and was requiring 2L O2 to maintain saturations. Though CXR did not yet show opacity, antibiotics were given for aspiration pneumonia   Assessment and Plan: Acute hypoxic respiratory failure due to aspiration pneumonitis, pneumonia, and AECOPD:  - SLP evaluation >> continue dysphagia 3 diet. Aspiration precautions - Continue antibiotics as below - Weaned durably from oxygen, treated AECOPD as below. Note high suspicion for upper airway wheezing at times.   Lactic acidosis, sepsis due to Klebsiella oxytoca bacteremia: No urinalysis sent on admission.  - Acidosis and anion gap resolved. LA improved. Hemodynamics improving. Susceptibilities show resistance only to ampicillin. Will change to augmentin per discussion with ID, 6 more days after discharge to complete 10 day course. No further work up for source is needed.    Hyperkalemia:  - Resolved without intervention   AECOPD: Some wheezing initially, given steroids, resolved and has returned, so steroids restarted. Will complete course with single prednisone dose on the day after discharge.   CAD, HLD: s/p DES to LAD 2018. No chest pain.  - Continue beta blocker, statin. Not on aspirin presumably due to hx hemorrhagic CVA.   History of CVA, left  hemiparesis:  - Ok to continue home meds.     Chronic HFpEF: BNP slightly elevated at 119, troponin flat at 22.   - Restart all home medications.   AS s/p TAVR 2018: Noted   AKI: Improving.   T2DM: HbA1c 6.3%. Continue home meds.   Dementia:  - Delirium precautions  Stage I left buttock pressure injury POA: Offload as able.  Consultants: ID curbside Procedures performed: None  Disposition: Return to LTC facility ([facility]) Diet recommendation: Dysphagia 3, heart healthy, carb modified DISCHARGE MEDICATION: Allergies as of 10/16/2023   No Known Allergies      Medication List     TAKE these medications    acetaminophen 500 MG tablet Commonly known as: TYLENOL Take 1,000 mg by mouth 2 (two) times daily as needed for mild pain (pain score 1-3).   amoxicillin-clavulanate 875-125 MG tablet Commonly known as: AUGMENTIN Take 1 tablet by mouth every 12 (twelve) hours for 6 days.   atorvastatin 40 MG tablet Commonly known as: LIPITOR Take 1 tablet (40 mg total) by mouth daily at 6 PM.   baclofen 10 MG tablet Commonly known as: LIORESAL Take 5 mg by mouth 2 (two) times daily.   carvedilol 3.125 MG tablet Commonly known as: COREG Take 3.125 mg by mouth daily.   Entresto 24-26 MG Generic drug: sacubitril-valsartan Take 0.5 tablets by mouth 2 (two) times daily.   furosemide 40 MG tablet Commonly known as: Lasix Take 1 tablet (40 mg total) by mouth daily.   ipratropium-albuterol 0.5-2.5 (3) MG/3ML Soln Commonly known as: DUONEB Take 3 mLs by nebulization every 4 (four) hours as needed (wheezing).  loratadine 10 MG tablet Commonly known as: CLARITIN Take 10 mg by mouth daily.   multivitamin with minerals Tabs tablet Take 1 tablet by mouth daily.   polyethylene glycol 17 g packet Commonly known as: MIRALAX / GLYCOLAX Take 17 g by mouth every other day.   predniSONE 20 MG tablet Commonly known as: DELTASONE Take 2 tablets (40 mg total) by mouth daily  with breakfast for 1 day. Start taking on: October 17, 2023        Contact information for after-discharge care     Destination     [facility] Preferred SNF .   Service: Skilled Nursing Contact information: [address] [place] [address] [phone]                    Discharge Exam: Ceasar Mons Weights   10/14/23 0349 10/15/23 0018 10/16/23 0150  Weight: 81.1 kg 79.1 kg 78.5 kg  BP (!) 133/92 (BP Location: Right Arm)   Pulse (!) 108   Temp 97.8 F (36.6 C) (Oral)   Resp 16   Ht 5\' 7"  (1.702 m)   Wt 78.5 kg   SpO2 97%   BMI 27.11 kg/m   No distress, confused by interactive. Lungs clear, nonlabored on room air. Abdomen remains protuberant but not tender with NABS.  Condition at discharge: stable  The results of significant diagnostics from this hospitalization (including imaging, microbiology, ancillary and laboratory) are listed below for reference.   Imaging Studies: DG CHEST PORT 1 VIEW Result Date: 10/15/2023 CLINICAL DATA:  Acute respiratory failure with hypoxia. EXAM: PORTABLE CHEST 1 VIEW COMPARISON:  October 12, 2023. FINDINGS: The heart size and mediastinal contours are within normal limits. Status post transcatheter aortic valve repair. Both lungs are clear. The visualized skeletal structures are unremarkable. IMPRESSION: No active disease. Electronically Signed   By: Lupita Raider M.D.   On: 10/15/2023 12:10   DG Chest Port 1 View Result Date: 10/12/2023 CLINICAL DATA:  Shortness of breath EXAM: PORTABLE CHEST 1 VIEW COMPARISON:  08/07/2017 FINDINGS: Multiple monitoring leads overlie the lower chest. Heart size appears within normal limits. Prior TAVR. Aortic atherosclerosis. No focal airspace consolidation, pleural effusion, or pneumothorax. The visualized skeletal structures are unremarkable. IMPRESSION: No active disease. Electronically Signed   By: Duanne Guess D.O.   On: 10/12/2023 20:19     Microbiology: Results for orders placed or performed during the hospital encounter of 10/12/23  Resp panel by RT-PCR (RSV, Flu A&B, Covid) Anterior Nasal Swab     Status: None   Collection Time: 10/12/23  9:10 PM   Specimen: Anterior Nasal Swab  Result Value Ref Range Status   SARS Coronavirus 2 by RT PCR NEGATIVE NEGATIVE Final   Influenza A by PCR NEGATIVE NEGATIVE Final   Influenza B by PCR NEGATIVE NEGATIVE Final    Comment: (NOTE) The Xpert Xpress SARS-CoV-2/FLU/RSV plus assay is intended as an aid in the diagnosis of influenza from Nasopharyngeal swab specimens and should not be used as a sole basis for treatment. Nasal washings and aspirates are unacceptable for Xpert Xpress SARS-CoV-2/FLU/RSV testing.  Fact Sheet for Patients: BloggerCourse.com  Fact Sheet for Healthcare Providers: SeriousBroker.it  This test is not yet approved or cleared by the [place] FDA and has been authorized for detection and/or diagnosis of SARS-CoV-2 by FDA under an Emergency Use Authorization (EUA). This EUA will remain in effect (meaning this test can be used) for the duration of the COVID-19 declaration  under Section 564(b)(1) of the Act, 21 U.S.C. section 360bbb-3(b)(1), unless the authorization is terminated or revoked.     Resp Syncytial Virus by PCR NEGATIVE NEGATIVE Final    Comment: (NOTE) Fact Sheet for Patients: BloggerCourse.com  Fact Sheet for Healthcare Providers: SeriousBroker.it  This test is not yet approved or cleared by the [place] FDA and has been authorized for detection and/or diagnosis of SARS-CoV-2 by FDA under an Emergency Use Authorization (EUA). This EUA will remain in effect (meaning this test can be used) for the duration of the COVID-19 declaration under Section 564(b)(1) of the Act, 21 U.S.C. section 360bbb-3(b)(1), unless the authorization is  terminated or revoked.  Performed at [facility], [address], [place], [place] [address]   Culture, blood (routine x 2)     Status: Abnormal   Collection Time: 10/12/23  9:10 PM   Specimen: BLOOD  Result Value Ref Range Status   Specimen Description BLOOD SITE NOT SPECIFIED  Final   Special Requests   Final    BOTTLES DRAWN AEROBIC AND ANAEROBIC Blood Culture adequate volume   Culture  Setup Time   Final    GRAM NEGATIVE RODS IN BOTH AEROBIC AND ANAEROBIC BOTTLES CRITICAL RESULT CALLED TO, READ BACK BY AND VERIFIED WITH: Valarie Merino ZHLU [id number] 1332 BY Berline Chough, [place] Performed at [facility], [address], [place], [place] [address]    Culture KLEBSIELLA OXYTOCA (A)  Final   Report Status 10/15/2023 FINAL  Final   Organism ID, Bacteria KLEBSIELLA OXYTOCA  Final      Susceptibility   Klebsiella oxytoca - MIC*    AMPICILLIN RESISTANT Resistant     CEFEPIME <=0.12 SENSITIVE Sensitive     CEFTAZIDIME <=1 SENSITIVE Sensitive     CEFTRIAXONE <=0.25 SENSITIVE Sensitive     CIPROFLOXACIN <=0.25 SENSITIVE Sensitive     GENTAMICIN <=1 SENSITIVE Sensitive     IMIPENEM <=0.25 SENSITIVE Sensitive     TRIMETH/SULFA <=20 SENSITIVE Sensitive     AMPICILLIN/SULBACTAM <=2 SENSITIVE Sensitive     PIP/TAZO <=4 SENSITIVE Sensitive ug/mL    * KLEBSIELLA OXYTOCA  Blood Culture ID Panel (Reflexed)     Status: Abnormal   Collection Time: 10/12/23  9:10 PM  Result Value Ref Range Status   Enterococcus faecalis NOT DETECTED NOT DETECTED Final   Enterococcus Faecium NOT DETECTED NOT DETECTED Final   Listeria monocytogenes NOT DETECTED NOT DETECTED Final   Staphylococcus species NOT DETECTED NOT DETECTED Final   Staphylococcus aureus (BCID) NOT DETECTED NOT DETECTED Final   Staphylococcus epidermidis NOT DETECTED NOT DETECTED Final   Staphylococcus lugdunensis NOT DETECTED NOT DETECTED Final   Streptococcus species NOT DETECTED NOT DETECTED Final   Streptococcus agalactiae NOT  DETECTED NOT DETECTED Final   Streptococcus pneumoniae NOT DETECTED NOT DETECTED Final   Streptococcus pyogenes NOT DETECTED NOT DETECTED Final   A.calcoaceticus-baumannii NOT DETECTED NOT DETECTED Final   Bacteroides fragilis NOT DETECTED NOT DETECTED Final   Enterobacterales DETECTED (A) NOT DETECTED Final    Comment: Enterobacterales represent a large order of gram negative bacteria, not a single organism. CRITICAL RESULT CALLED TO, READ BACK BY AND VERIFIED WITH: PHARMD JENNY ZHLU [id number] 1332 BY J RAZZAK, MT    Enterobacter cloacae complex NOT DETECTED NOT DETECTED Final   Escherichia coli NOT DETECTED NOT DETECTED Final   Klebsiella aerogenes NOT DETECTED NOT DETECTED Final   Klebsiella oxytoca DETECTED (A) NOT DETECTED Final    Comment: CRITICAL RESULT CALLED TO, READ  BACK BY AND VERIFIED WITH: PHARMD JENNY ZHLU [id number] 1332 BY J RAZZAK, MT    Klebsiella pneumoniae NOT DETECTED NOT DETECTED Final   Proteus species NOT DETECTED NOT DETECTED Final   Salmonella species NOT DETECTED NOT DETECTED Final   Serratia marcescens NOT DETECTED NOT DETECTED Final   Haemophilus influenzae NOT DETECTED NOT DETECTED Final   Neisseria meningitidis NOT DETECTED NOT DETECTED Final   Pseudomonas aeruginosa NOT DETECTED NOT DETECTED Final   Stenotrophomonas maltophilia NOT DETECTED NOT DETECTED Final   Candida albicans NOT DETECTED NOT DETECTED Final   Candida auris NOT DETECTED NOT DETECTED Final   Candida glabrata NOT DETECTED NOT DETECTED Final   Candida krusei NOT DETECTED NOT DETECTED Final   Candida parapsilosis NOT DETECTED NOT DETECTED Final   Candida tropicalis NOT DETECTED NOT DETECTED Final   Cryptococcus neoformans/gattii NOT DETECTED NOT DETECTED Final   CTX-M ESBL NOT DETECTED NOT DETECTED Final   Carbapenem resistance IMP NOT DETECTED NOT DETECTED Final   Carbapenem resistance KPC NOT DETECTED NOT DETECTED Final   Carbapenem resistance NDM NOT DETECTED NOT DETECTED Final    Carbapenem resist OXA 48 LIKE NOT DETECTED NOT DETECTED Final   Carbapenem resistance VIM NOT DETECTED NOT DETECTED Final    Comment: Performed at [facility], [address] [place], [place] [address]  Culture, blood (routine x 2)     Status: None (Preliminary result)   Collection Time: 10/12/23  9:25 PM   Specimen: BLOOD  Result Value Ref Range Status   Specimen Description BLOOD RIGHT ANTECUBITAL  Final   Special Requests   Final    BOTTLES DRAWN AEROBIC AND ANAEROBIC Blood Culture adequate volume   Culture   Final    NO GROWTH 3 DAYS Performed at [facility], [address], [place], [place] [address]    Report Status PENDING  Incomplete    Labs: CBC: Recent Labs  Lab 10/12/23 2055 10/12/23 2102 10/13/23 0244 10/15/23 0249  WBC 12.1*  --  14.2* 13.3*  NEUTROABS 11.1*  --   --   --   HGB 13.2 13.3  13.3 12.5* 11.6*  HCT 39.4 39.0  39.0 38.1* 35.3*  MCV 90.8  --  92.0 91.7  PLT 214  --  237 255   Basic Metabolic Panel: Recent Labs  Lab 10/12/23 2055 10/12/23 2102 10/13/23 0244 10/13/23 1008 10/14/23 0245 10/15/23 0249  NA 137 139  139 140 137 136 138  K 4.5 4.4  4.4 5.7* 4.8 3.8 3.7  CL 105 104 106 105 101 102  CO2 21*  --  20* 20* 27 23  GLUCOSE 193* 197* 231* 164* 139* 140*  BUN 21 23 26* 34* 35* 26*  CREATININE 1.48* 1.60* 1.87* 1.97* 1.71* 1.64*  CALCIUM 9.0  --  8.9 8.6* 8.1* 8.6*  MG  --   --  1.8  --   --   --   PHOS  --   --  2.9  --   --   --    Liver Function Tests: Recent Labs  Lab 10/12/23 2055 10/13/23 0244  AST 48* 45*  ALT 33 28  ALKPHOS 142* 120  BILITOT 0.9 1.1  PROT 7.2 7.0  ALBUMIN 3.6 3.2*   CBG: Recent Labs  Lab 10/15/23 0611 10/15/23 1104 10/15/23 1610 10/15/23 2114 10/16/23 0557  GLUCAP 110* 153* 134* 142* 108*    Discharge time spent: greater than 30 minutes.  Signed: [name], MD [facility]  10/16/2023 

## 2023-10-16 NOTE — Progress Notes (Signed)
Handoff report given to [facility] LPN at [facility] facility.

## 2023-10-16 NOTE — TOC Transition Note (Signed)
Transition of Care [facility]) - Discharge Note   Patient Details  Name: Edward Strickland MRN: [id number] Date of Birth: 1941/07/21  [facility] CM/SW Contact:  Michaela Corner, LCSWA Phone Number: 10/16/2023, 10:53 AM   Clinical Narrative:   Patient will DC to: [facility] Anticipated DC date: 10/16/2023 Transport by: Sharin Mons   Per MD patient ready for DC to [facility]. RN to call report prior to discharge [phone]). RN, patient and facility notified of DC. Discharge Summary and FL2 sent to facility. DC packet on chart. Ambulance transport requested for patient.   CSW will sign off for now as social work intervention is no longer needed. Please consult [place] again if new needs arise.     Final next level of care: Skilled Nursing Facility (Long term care) Barriers to Discharge: Barriers Resolved   Patient Goals and CMS Choice Patient states their goals for this hospitalization and ongoing recovery are:: Unable to assess          Discharge Placement              Patient chooses bed at: [facility],  Patient to be transferred to facility by: [facility] Name of family member notified: Pt was notified Patient and family notified of of transfer: 10/16/23  Discharge Plan and Services Additional resources added to the After Visit Summary for   In-house Referral: Clinical Social Work                                   Social Drivers of Health (SDOH) Interventions SDOH Screenings   Food Insecurity: Patient Unable To Answer (10/13/2023)  Housing: Patient Unable To Answer (10/13/2023)  Transportation Needs: Patient Unable To Answer (10/13/2023)  Utilities: Patient Unable To Answer (10/13/2023)  Tobacco Use: Medium Risk (10/12/2023)     Readmission Risk Interventions     No data to display

## 2023-10-16 NOTE — Progress Notes (Signed)
Patient discharged via [facility] to [facility] facility in stable condition.

## 2023-10-17 LAB — CULTURE, BLOOD (ROUTINE X 2)
Culture: NO GROWTH
Special Requests: ADEQUATE
